# Patient Record
Sex: Female | Born: 1955 | Race: White | Hispanic: No | Marital: Married | Smoking: Former smoker
Health system: Southern US, Community
[De-identification: ages and names within clinical notes are randomized; demographics above are authoritative.]

## PROBLEM LIST (undated history)

## (undated) DIAGNOSIS — M8580 Other specified disorders of bone density and structure, unspecified site: Secondary | ICD-10-CM

## (undated) DIAGNOSIS — S82143A Displaced bicondylar fracture of unspecified tibia, initial encounter for closed fracture: Secondary | ICD-10-CM

## (undated) DIAGNOSIS — D509 Iron deficiency anemia, unspecified: Secondary | ICD-10-CM

## (undated) DIAGNOSIS — N390 Urinary tract infection, site not specified: Secondary | ICD-10-CM

## (undated) DIAGNOSIS — E538 Deficiency of other specified B group vitamins: Secondary | ICD-10-CM

## (undated) DIAGNOSIS — K219 Gastro-esophageal reflux disease without esophagitis: Secondary | ICD-10-CM

## (undated) DIAGNOSIS — M1990 Unspecified osteoarthritis, unspecified site: Secondary | ICD-10-CM

## (undated) DIAGNOSIS — E611 Iron deficiency: Secondary | ICD-10-CM

## (undated) DIAGNOSIS — R911 Solitary pulmonary nodule: Secondary | ICD-10-CM

## (undated) DIAGNOSIS — T7840XA Allergy, unspecified, initial encounter: Secondary | ICD-10-CM

## (undated) DIAGNOSIS — J189 Pneumonia, unspecified organism: Secondary | ICD-10-CM

## (undated) DIAGNOSIS — R12 Heartburn: Secondary | ICD-10-CM

## (undated) HISTORY — DX: Allergy, unspecified, initial encounter: T78.40XA

## (undated) HISTORY — DX: Solitary pulmonary nodule: R91.1

## (undated) HISTORY — DX: Other specified disorders of bone density and structure, unspecified site: M85.80

## (undated) HISTORY — DX: Gastro-esophageal reflux disease without esophagitis: K21.9

## (undated) HISTORY — DX: Iron deficiency anemia, unspecified: D50.9

## (undated) HISTORY — DX: Deficiency of other specified B group vitamins: E53.8

## (undated) HISTORY — PX: POLYPECTOMY: SHX149

## (undated) HISTORY — DX: Heartburn: R12

## (undated) HISTORY — DX: Iron deficiency: E61.1

## (undated) HISTORY — PX: COLONOSCOPY: SHX174

## (undated) HISTORY — DX: Unspecified osteoarthritis, unspecified site: M19.90

## (undated) HISTORY — PX: COSMETIC SURGERY: SHX468

## (undated) HISTORY — DX: Displaced bicondylar fracture of unspecified tibia, initial encounter for closed fracture: S82.143A

## (undated) HISTORY — PX: CATARACT EXTRACTION: SUR2

## (undated) HISTORY — DX: Pneumonia, unspecified organism: J18.9

## (undated) HISTORY — PX: OTHER SURGICAL HISTORY: SHX169

---

## 1998-07-10 ENCOUNTER — Other Ambulatory Visit: Admission: RE | Admit: 1998-07-10 | Discharge: 1998-07-10 | Payer: Self-pay | Admitting: Obstetrics & Gynecology

## 1999-12-08 HISTORY — PX: GASTRIC BYPASS: SHX52

## 1999-12-22 ENCOUNTER — Encounter: Admission: RE | Admit: 1999-12-22 | Discharge: 2000-02-06 | Payer: Self-pay | Admitting: Internal Medicine

## 2000-03-25 ENCOUNTER — Other Ambulatory Visit: Admission: RE | Admit: 2000-03-25 | Discharge: 2000-03-25 | Payer: Self-pay | Admitting: Obstetrics & Gynecology

## 2001-04-25 ENCOUNTER — Other Ambulatory Visit: Admission: RE | Admit: 2001-04-25 | Discharge: 2001-04-25 | Payer: Self-pay | Admitting: Family Medicine

## 2002-03-10 ENCOUNTER — Encounter: Payer: Self-pay | Admitting: Urology

## 2002-03-10 ENCOUNTER — Encounter: Admission: RE | Admit: 2002-03-10 | Discharge: 2002-03-10 | Payer: Self-pay | Admitting: Urology

## 2002-04-17 ENCOUNTER — Encounter: Payer: Self-pay | Admitting: Urology

## 2002-04-17 ENCOUNTER — Ambulatory Visit (HOSPITAL_BASED_OUTPATIENT_CLINIC_OR_DEPARTMENT_OTHER): Admission: RE | Admit: 2002-04-17 | Discharge: 2002-04-17 | Payer: Self-pay | Admitting: Urology

## 2002-05-03 ENCOUNTER — Encounter: Admission: RE | Admit: 2002-05-03 | Discharge: 2002-05-03 | Payer: Self-pay | Admitting: Urology

## 2002-05-03 ENCOUNTER — Encounter: Payer: Self-pay | Admitting: Urology

## 2002-06-12 ENCOUNTER — Other Ambulatory Visit: Admission: RE | Admit: 2002-06-12 | Discharge: 2002-06-12 | Payer: Self-pay | Admitting: Obstetrics & Gynecology

## 2002-08-04 ENCOUNTER — Encounter: Payer: Self-pay | Admitting: Urology

## 2002-08-04 ENCOUNTER — Encounter: Admission: RE | Admit: 2002-08-04 | Discharge: 2002-08-04 | Payer: Self-pay | Admitting: Urology

## 2003-09-06 ENCOUNTER — Other Ambulatory Visit: Admission: RE | Admit: 2003-09-06 | Discharge: 2003-09-06 | Payer: Self-pay | Admitting: Obstetrics & Gynecology

## 2004-11-12 ENCOUNTER — Ambulatory Visit (HOSPITAL_BASED_OUTPATIENT_CLINIC_OR_DEPARTMENT_OTHER): Admission: RE | Admit: 2004-11-12 | Discharge: 2004-11-12 | Payer: Self-pay | Admitting: Plastic Surgery

## 2004-11-12 ENCOUNTER — Encounter (INDEPENDENT_AMBULATORY_CARE_PROVIDER_SITE_OTHER): Payer: Self-pay | Admitting: Specialist

## 2004-11-12 ENCOUNTER — Ambulatory Visit (HOSPITAL_COMMUNITY): Admission: RE | Admit: 2004-11-12 | Discharge: 2004-11-12 | Payer: Self-pay | Admitting: Plastic Surgery

## 2005-01-08 ENCOUNTER — Other Ambulatory Visit: Admission: RE | Admit: 2005-01-08 | Discharge: 2005-01-08 | Payer: Self-pay | Admitting: Obstetrics & Gynecology

## 2005-01-15 ENCOUNTER — Ambulatory Visit: Payer: Self-pay | Admitting: Internal Medicine

## 2005-01-23 ENCOUNTER — Ambulatory Visit: Payer: Self-pay | Admitting: Internal Medicine

## 2005-03-24 ENCOUNTER — Ambulatory Visit: Payer: Self-pay | Admitting: Internal Medicine

## 2005-03-31 ENCOUNTER — Ambulatory Visit: Payer: Self-pay | Admitting: Internal Medicine

## 2005-04-30 ENCOUNTER — Ambulatory Visit: Payer: Self-pay | Admitting: Internal Medicine

## 2005-05-07 ENCOUNTER — Ambulatory Visit: Payer: Self-pay | Admitting: Internal Medicine

## 2005-06-01 ENCOUNTER — Ambulatory Visit: Payer: Self-pay | Admitting: Internal Medicine

## 2005-06-08 ENCOUNTER — Ambulatory Visit: Payer: Self-pay | Admitting: Internal Medicine

## 2005-06-11 ENCOUNTER — Ambulatory Visit: Payer: Self-pay | Admitting: Internal Medicine

## 2005-06-25 ENCOUNTER — Ambulatory Visit: Payer: Self-pay | Admitting: Internal Medicine

## 2005-06-29 ENCOUNTER — Ambulatory Visit: Payer: Self-pay | Admitting: Internal Medicine

## 2005-08-05 ENCOUNTER — Ambulatory Visit: Payer: Self-pay | Admitting: Internal Medicine

## 2005-09-08 ENCOUNTER — Ambulatory Visit: Payer: Self-pay

## 2005-09-08 ENCOUNTER — Encounter: Payer: Self-pay | Admitting: Internal Medicine

## 2005-09-09 ENCOUNTER — Ambulatory Visit: Payer: Self-pay | Admitting: Internal Medicine

## 2005-09-21 ENCOUNTER — Ambulatory Visit: Payer: Self-pay | Admitting: Internal Medicine

## 2005-09-23 ENCOUNTER — Encounter (HOSPITAL_COMMUNITY): Admission: RE | Admit: 2005-09-23 | Discharge: 2005-12-22 | Payer: Self-pay | Admitting: Internal Medicine

## 2005-10-22 ENCOUNTER — Ambulatory Visit: Payer: Self-pay | Admitting: Internal Medicine

## 2005-11-19 ENCOUNTER — Ambulatory Visit: Payer: Self-pay | Admitting: Internal Medicine

## 2005-12-21 ENCOUNTER — Ambulatory Visit: Payer: Self-pay | Admitting: Internal Medicine

## 2006-01-27 ENCOUNTER — Other Ambulatory Visit: Admission: RE | Admit: 2006-01-27 | Discharge: 2006-01-27 | Payer: Self-pay | Admitting: Obstetrics & Gynecology

## 2006-03-10 ENCOUNTER — Ambulatory Visit: Payer: Self-pay | Admitting: Internal Medicine

## 2006-04-20 ENCOUNTER — Ambulatory Visit: Payer: Self-pay | Admitting: Internal Medicine

## 2006-05-14 ENCOUNTER — Encounter (HOSPITAL_COMMUNITY): Admission: RE | Admit: 2006-05-14 | Discharge: 2006-08-12 | Payer: Self-pay | Admitting: Internal Medicine

## 2006-06-14 ENCOUNTER — Ambulatory Visit: Payer: Self-pay | Admitting: Internal Medicine

## 2006-06-15 ENCOUNTER — Ambulatory Visit: Payer: Self-pay | Admitting: Internal Medicine

## 2006-07-14 ENCOUNTER — Ambulatory Visit: Payer: Self-pay | Admitting: Internal Medicine

## 2006-08-12 ENCOUNTER — Ambulatory Visit: Payer: Self-pay | Admitting: Internal Medicine

## 2006-09-07 ENCOUNTER — Ambulatory Visit: Payer: Self-pay | Admitting: Internal Medicine

## 2006-10-08 ENCOUNTER — Ambulatory Visit: Payer: Self-pay | Admitting: Internal Medicine

## 2006-10-18 ENCOUNTER — Encounter (HOSPITAL_COMMUNITY): Admission: RE | Admit: 2006-10-18 | Discharge: 2006-12-02 | Payer: Self-pay | Admitting: Internal Medicine

## 2006-11-09 ENCOUNTER — Ambulatory Visit: Payer: Self-pay | Admitting: Internal Medicine

## 2006-11-17 ENCOUNTER — Ambulatory Visit: Payer: Self-pay | Admitting: Internal Medicine

## 2006-12-08 ENCOUNTER — Ambulatory Visit: Payer: Self-pay | Admitting: Internal Medicine

## 2006-12-08 LAB — CONVERTED CEMR LAB
BUN: 9 mg/dL (ref 6–23)
Folate: 6.1 ng/mL
Potassium: 3.7 meq/L (ref 3.5–5.1)
Vitamin B-12: >1500 pg/mL — ABNORMAL HIGH (ref 211–911)

## 2007-01-07 ENCOUNTER — Ambulatory Visit: Payer: Self-pay | Admitting: Internal Medicine

## 2007-02-07 ENCOUNTER — Ambulatory Visit: Payer: Self-pay | Admitting: Internal Medicine

## 2007-02-11 ENCOUNTER — Ambulatory Visit (HOSPITAL_COMMUNITY): Admission: RE | Admit: 2007-02-11 | Discharge: 2007-02-11 | Payer: Self-pay | Admitting: Family Medicine

## 2007-03-03 ENCOUNTER — Ambulatory Visit: Payer: Self-pay | Admitting: Internal Medicine

## 2007-03-03 LAB — CONVERTED CEMR LAB: Vit D, 1,25-Dihydroxy: 19 — ABNORMAL LOW (ref 20–57)

## 2007-04-14 ENCOUNTER — Ambulatory Visit: Payer: Self-pay | Admitting: Internal Medicine

## 2007-05-13 ENCOUNTER — Ambulatory Visit: Payer: Self-pay | Admitting: Internal Medicine

## 2007-05-24 ENCOUNTER — Ambulatory Visit: Payer: Self-pay | Admitting: Internal Medicine

## 2007-05-24 LAB — CONVERTED CEMR LAB
Basophils Relative: 1.3 % — ABNORMAL HIGH (ref 0.0–1.0)
Eosinophils Absolute: 0.4 10*3/uL (ref 0.0–0.6)
Eosinophils Relative: 6 % — ABNORMAL HIGH (ref 0.0–5.0)
Folate: 3.8 ng/mL
HCT: 36.6 % (ref 36.0–46.0)
Hemoglobin: 12.8 g/dL (ref 12.0–15.0)
Lymphocytes Relative: 26.8 % (ref 12.0–46.0)
MCV: 93 fL (ref 78.0–100.0)
Neutro Abs: 3.7 10*3/uL (ref 1.4–7.7)
Neutrophils Relative %: 61 % (ref 43.0–77.0)
Platelets: 185 10*3/uL (ref 150–400)
WBC: 6.1 10*3/uL (ref 4.5–10.5)

## 2007-06-02 DIAGNOSIS — L408 Other psoriasis: Secondary | ICD-10-CM | POA: Insufficient documentation

## 2007-06-02 DIAGNOSIS — K219 Gastro-esophageal reflux disease without esophagitis: Secondary | ICD-10-CM | POA: Insufficient documentation

## 2007-06-02 DIAGNOSIS — M949 Disorder of cartilage, unspecified: Secondary | ICD-10-CM

## 2007-06-02 DIAGNOSIS — M899 Disorder of bone, unspecified: Secondary | ICD-10-CM | POA: Insufficient documentation

## 2007-06-02 DIAGNOSIS — M1990 Unspecified osteoarthritis, unspecified site: Secondary | ICD-10-CM | POA: Insufficient documentation

## 2007-06-09 ENCOUNTER — Ambulatory Visit: Payer: Self-pay | Admitting: Internal Medicine

## 2007-08-04 ENCOUNTER — Encounter: Payer: Self-pay | Admitting: Internal Medicine

## 2007-08-04 LAB — HM DIABETES EYE EXAM

## 2007-08-09 ENCOUNTER — Ambulatory Visit: Payer: Self-pay | Admitting: Internal Medicine

## 2007-08-09 LAB — CONVERTED CEMR LAB
Folate: 8.9 ng/mL
Iron: 54 ug/dL (ref 42–145)

## 2007-08-10 ENCOUNTER — Encounter: Payer: Self-pay | Admitting: Internal Medicine

## 2007-08-10 LAB — CONVERTED CEMR LAB: Vit D, 1,25-Dihydroxy: 17 — ABNORMAL LOW (ref 20–57)

## 2007-08-15 ENCOUNTER — Ambulatory Visit: Payer: Self-pay | Admitting: Internal Medicine

## 2007-08-15 DIAGNOSIS — D518 Other vitamin B12 deficiency anemias: Secondary | ICD-10-CM | POA: Insufficient documentation

## 2007-09-09 ENCOUNTER — Telehealth: Payer: Self-pay | Admitting: Internal Medicine

## 2007-09-12 ENCOUNTER — Ambulatory Visit: Payer: Self-pay | Admitting: Internal Medicine

## 2007-10-12 ENCOUNTER — Ambulatory Visit: Payer: Self-pay | Admitting: Internal Medicine

## 2007-11-10 ENCOUNTER — Ambulatory Visit: Payer: Self-pay | Admitting: Internal Medicine

## 2007-11-10 LAB — CONVERTED CEMR LAB
Basophils Absolute: 0 10*3/uL (ref 0.0–0.1)
Basophils Relative: 0 % (ref 0.0–1.0)
HCT: 37.3 % (ref 36.0–46.0)
Iron: 92 ug/dL (ref 42–145)
MCHC: 35 g/dL (ref 30.0–36.0)
Monocytes Relative: 5.5 % (ref 3.0–11.0)
RBC: 4.05 M/uL (ref 3.87–5.11)
RDW: 11 % — ABNORMAL LOW (ref 11.5–14.6)
Vitamin B-12: 418 pg/mL (ref 211–911)

## 2007-11-21 ENCOUNTER — Ambulatory Visit: Payer: Self-pay | Admitting: Internal Medicine

## 2007-11-21 DIAGNOSIS — D509 Iron deficiency anemia, unspecified: Secondary | ICD-10-CM | POA: Insufficient documentation

## 2007-11-29 ENCOUNTER — Telehealth: Payer: Self-pay | Admitting: Internal Medicine

## 2007-12-12 ENCOUNTER — Ambulatory Visit: Payer: Self-pay | Admitting: Internal Medicine

## 2008-01-11 ENCOUNTER — Ambulatory Visit: Payer: Self-pay | Admitting: Internal Medicine

## 2008-02-07 ENCOUNTER — Ambulatory Visit: Payer: Self-pay | Admitting: Internal Medicine

## 2008-02-07 LAB — CONVERTED CEMR LAB
Basophils Relative: 0.8 % (ref 0.0–1.0)
Eosinophils Relative: 4.6 % (ref 0.0–5.0)
HCT: 38.3 % (ref 36.0–46.0)
Platelets: 211 10*3/uL (ref 150–400)
RBC: 4.12 M/uL (ref 3.87–5.11)
RDW: 11.2 % — ABNORMAL LOW (ref 11.5–14.6)
WBC: 6.2 10*3/uL (ref 4.5–10.5)

## 2008-03-01 ENCOUNTER — Ambulatory Visit: Payer: Self-pay | Admitting: Internal Medicine

## 2008-03-01 DIAGNOSIS — J309 Allergic rhinitis, unspecified: Secondary | ICD-10-CM | POA: Insufficient documentation

## 2008-03-05 ENCOUNTER — Encounter (HOSPITAL_COMMUNITY): Admission: RE | Admit: 2008-03-05 | Discharge: 2008-06-03 | Payer: Self-pay | Admitting: Internal Medicine

## 2008-03-06 ENCOUNTER — Telehealth: Payer: Self-pay | Admitting: Internal Medicine

## 2008-06-19 ENCOUNTER — Ambulatory Visit: Payer: Self-pay | Admitting: Internal Medicine

## 2008-06-19 LAB — CONVERTED CEMR LAB
Basophils Relative: 0.9 % (ref 0.0–1.0)
Eosinophils Absolute: 0.3 10*3/uL (ref 0.0–0.7)
Eosinophils Relative: 5.3 % — ABNORMAL HIGH (ref 0.0–5.0)
HCT: 38 % (ref 36.0–46.0)
Hemoglobin: 13.2 g/dL (ref 12.0–15.0)
Iron: 141 ug/dL (ref 42–145)
MCV: 94.5 fL (ref 78.0–100.0)
Monocytes Absolute: 0.3 10*3/uL (ref 0.1–1.0)
Neutro Abs: 3.7 10*3/uL (ref 1.4–7.7)
Neutrophils Relative %: 62.4 % (ref 43.0–77.0)
RBC: 4.02 M/uL (ref 3.87–5.11)
WBC: 5.9 10*3/uL (ref 4.5–10.5)

## 2008-06-26 ENCOUNTER — Ambulatory Visit: Payer: Self-pay | Admitting: Internal Medicine

## 2008-07-06 LAB — CONVERTED CEMR LAB: Pap Smear: NORMAL

## 2008-08-23 ENCOUNTER — Telehealth: Payer: Self-pay | Admitting: Internal Medicine

## 2008-08-23 ENCOUNTER — Ambulatory Visit: Payer: Self-pay | Admitting: Internal Medicine

## 2008-08-23 DIAGNOSIS — G43709 Chronic migraine without aura, not intractable, without status migrainosus: Secondary | ICD-10-CM | POA: Insufficient documentation

## 2008-08-28 ENCOUNTER — Ambulatory Visit: Payer: Self-pay | Admitting: Cardiology

## 2008-09-19 ENCOUNTER — Ambulatory Visit: Payer: Self-pay | Admitting: Internal Medicine

## 2008-09-19 LAB — CONVERTED CEMR LAB
HCT: 37.4 % (ref 36.0–46.0)
Hemoglobin: 12.9 g/dL (ref 12.0–15.0)
Iron: 78 ug/dL (ref 42–145)
Monocytes Absolute: 0.4 10*3/uL (ref 0.1–1.0)
Monocytes Relative: 5.7 % (ref 3.0–12.0)
Neutro Abs: 4.3 10*3/uL (ref 1.4–7.7)
RDW: 11.7 % (ref 11.5–14.6)

## 2008-09-25 ENCOUNTER — Ambulatory Visit: Payer: Self-pay | Admitting: Internal Medicine

## 2008-10-05 LAB — CONVERTED CEMR LAB: Pap Smear: NORMAL

## 2008-12-25 ENCOUNTER — Ambulatory Visit: Payer: Self-pay | Admitting: Internal Medicine

## 2008-12-25 LAB — CONVERTED CEMR LAB
Basophils Absolute: 0 10*3/uL (ref 0.0–0.1)
Basophils Relative: 0.4 % (ref 0.0–3.0)
Eosinophils Absolute: 0.2 10*3/uL (ref 0.0–0.7)
Hemoglobin: 13.3 g/dL (ref 12.0–15.0)
MCHC: 34.9 g/dL (ref 30.0–36.0)
MCV: 94.1 fL (ref 78.0–100.0)
Monocytes Absolute: 0.4 10*3/uL (ref 0.1–1.0)
Neutro Abs: 4.4 10*3/uL (ref 1.4–7.7)
Neutrophils Relative %: 58.7 % (ref 43.0–77.0)
RBC: 4.06 M/uL (ref 3.87–5.11)

## 2009-01-02 ENCOUNTER — Ambulatory Visit: Payer: Self-pay | Admitting: Internal Medicine

## 2009-04-05 ENCOUNTER — Ambulatory Visit: Payer: Self-pay | Admitting: Internal Medicine

## 2009-04-05 DIAGNOSIS — F4322 Adjustment disorder with anxiety: Secondary | ICD-10-CM | POA: Insufficient documentation

## 2009-05-21 ENCOUNTER — Ambulatory Visit: Payer: Self-pay | Admitting: Internal Medicine

## 2009-06-18 ENCOUNTER — Ambulatory Visit: Payer: Self-pay | Admitting: Internal Medicine

## 2009-06-18 LAB — CONVERTED CEMR LAB
ALT: 32 units/L (ref 0–35)
AST: 29 units/L (ref 0–37)
Basophils Relative: 0 % (ref 0.0–3.0)
Bilirubin, Direct: 0.1 mg/dL (ref 0.0–0.3)
Chloride: 105 meq/L (ref 96–112)
Creatinine, Ser: 0.6 mg/dL (ref 0.4–1.2)
Eosinophils Relative: 7.3 % — ABNORMAL HIGH (ref 0.0–5.0)
GFR calc non Af Amer: 110.13 mL/min (ref >60)
HCT: 38.4 % (ref 36.0–46.0)
LDL Cholesterol: 75 mg/dL (ref 0–99)
MCV: 96 fL (ref 78.0–100.0)
Monocytes Absolute: 0.3 10*3/uL (ref 0.1–1.0)
Monocytes Relative: 5.7 % (ref 3.0–12.0)
Neutrophils Relative %: 60.6 % (ref 43.0–77.0)
Nitrite: NEGATIVE
Potassium: 4.8 meq/L (ref 3.5–5.1)
RBC: 4 M/uL (ref 3.87–5.11)
Total CHOL/HDL Ratio: 2
Total Protein: 6.2 g/dL (ref 6.0–8.3)
Urobilinogen, UA: 0.2
WBC: 6.1 10*3/uL (ref 4.5–10.5)

## 2009-06-25 ENCOUNTER — Ambulatory Visit: Payer: Self-pay | Admitting: Internal Medicine

## 2009-06-28 ENCOUNTER — Telehealth: Payer: Self-pay | Admitting: Speech Pathology

## 2009-10-04 LAB — CONVERTED CEMR LAB: Pap Smear: NORMAL

## 2009-12-16 ENCOUNTER — Ambulatory Visit: Payer: Self-pay | Admitting: Internal Medicine

## 2009-12-16 LAB — CONVERTED CEMR LAB
Basophils Absolute: 0 10*3/uL (ref 0.0–0.1)
Eosinophils Absolute: 0.2 10*3/uL (ref 0.0–0.7)
HCT: 37.2 % (ref 36.0–46.0)
Hemoglobin: 12.4 g/dL (ref 12.0–15.0)
Iron: 88 ug/dL (ref 42–145)
Lymphs Abs: 1.7 10*3/uL (ref 0.7–4.0)
MCHC: 33.4 g/dL (ref 30.0–36.0)
MCV: 97.6 fL (ref 78.0–100.0)
Monocytes Absolute: 0.2 10*3/uL (ref 0.1–1.0)
Monocytes Relative: 5.2 % (ref 3.0–12.0)
Neutro Abs: 2.7 10*3/uL (ref 1.4–7.7)
Platelets: 172 10*3/uL (ref 150.0–400.0)
RDW: 11.3 % — ABNORMAL LOW (ref 11.5–14.6)

## 2009-12-25 ENCOUNTER — Ambulatory Visit: Payer: Self-pay | Admitting: Internal Medicine

## 2009-12-25 DIAGNOSIS — G5600 Carpal tunnel syndrome, unspecified upper limb: Secondary | ICD-10-CM | POA: Insufficient documentation

## 2009-12-25 DIAGNOSIS — N318 Other neuromuscular dysfunction of bladder: Secondary | ICD-10-CM | POA: Insufficient documentation

## 2010-02-26 ENCOUNTER — Telehealth: Payer: Self-pay | Admitting: Internal Medicine

## 2010-08-25 ENCOUNTER — Ambulatory Visit: Payer: Self-pay | Admitting: Internal Medicine

## 2010-08-25 LAB — CONVERTED CEMR LAB
ALT: 25 U/L (ref 0–35)
AST: 21 U/L (ref 0–37)
Albumin: 3.4 g/dL — ABNORMAL LOW (ref 3.5–5.2)
Alkaline Phosphatase: 58 U/L (ref 39–117)
BUN: 11 mg/dL (ref 6–23)
Basophils Absolute: 0.1 K/uL (ref 0.0–0.1)
Basophils Relative: 0.9 % (ref 0.0–3.0)
Bilirubin Urine: NEGATIVE
Bilirubin, Direct: 0.1 mg/dL (ref 0.0–0.3)
CO2: 29 meq/L (ref 19–32)
Calcium: 8.8 mg/dL (ref 8.4–10.5)
Chloride: 105 meq/L (ref 96–112)
Cholesterol: 165 mg/dL (ref 0–200)
Creatinine, Ser: 0.6 mg/dL (ref 0.4–1.2)
Eosinophils Absolute: 0.4 K/uL (ref 0.0–0.7)
Eosinophils Relative: 6.4 % — ABNORMAL HIGH (ref 0.0–5.0)
GFR calc non Af Amer: 116.34 mL/min (ref >60)
Glucose, Bld: 76 mg/dL (ref 70–99)
HCT: 37.8 % (ref 36.0–46.0)
HDL: 66.6 mg/dL (ref >39.00)
Hemoglobin: 13.1 g/dL (ref 12.0–15.0)
Ketones, ur: NEGATIVE mg/dL
LDL Cholesterol: 83 mg/dL (ref 0–99)
Lymphocytes Relative: 23.6 % (ref 12.0–46.0)
Lymphs Abs: 1.4 K/uL (ref 0.7–4.0)
MCHC: 34.5 g/dL (ref 30.0–36.0)
MCV: 95.3 fL (ref 78.0–100.0)
Monocytes Absolute: 0.3 K/uL (ref 0.1–1.0)
Monocytes Relative: 5.2 % (ref 3.0–12.0)
Neutro Abs: 3.8 K/uL (ref 1.4–7.7)
Neutrophils Relative %: 63.9 % (ref 43.0–77.0)
Nitrite: NEGATIVE
Platelets: 176 K/uL (ref 150.0–400.0)
Potassium: 4.5 meq/L (ref 3.5–5.1)
RBC: 3.97 M/uL (ref 3.87–5.11)
RDW: 12 % (ref 11.5–14.6)
Sodium: 140 meq/L (ref 135–145)
Specific Gravity, Urine: 1.01 (ref 1.000–1.030)
TSH: 2.85 u[IU]/mL (ref 0.35–5.50)
Total Bilirubin: 0.5 mg/dL (ref 0.3–1.2)
Total CHOL/HDL Ratio: 2
Total Protein, Urine: NEGATIVE mg/dL
Total Protein: 5.9 g/dL — ABNORMAL LOW (ref 6.0–8.3)
Triglycerides: 78 mg/dL (ref 0.0–149.0)
Urine Glucose: NEGATIVE mg/dL
Urobilinogen, UA: 0.2 (ref 0.0–1.0)
VLDL: 15.6 mg/dL (ref 0.0–40.0)
WBC: 6 10*3/microliter (ref 4.5–10.5)
pH: 6 (ref 5.0–8.0)

## 2010-09-01 ENCOUNTER — Encounter: Payer: Self-pay | Admitting: Internal Medicine

## 2010-09-01 ENCOUNTER — Ambulatory Visit: Payer: Self-pay | Admitting: Internal Medicine

## 2010-09-01 DIAGNOSIS — Z9884 Bariatric surgery status: Secondary | ICD-10-CM | POA: Insufficient documentation

## 2010-10-22 ENCOUNTER — Ambulatory Visit: Payer: Self-pay | Admitting: Internal Medicine

## 2010-10-22 DIAGNOSIS — L570 Actinic keratosis: Secondary | ICD-10-CM | POA: Insufficient documentation

## 2010-10-22 LAB — CONVERTED CEMR LAB
Basophils Relative: 0.9 % (ref 0.0–3.0)
Eosinophils Relative: 4.1 % (ref 0.0–5.0)
Folate: 5.1 ng/mL
Lymphocytes Relative: 35.1 % (ref 12.0–46.0)
Monocytes Absolute: 0.3 10*3/uL (ref 0.1–1.0)
Monocytes Relative: 4.9 % (ref 3.0–12.0)
Neutrophils Relative %: 55 % (ref 43.0–77.0)
Platelets: 202 10*3/uL (ref 150.0–400.0)
RBC: 4.08 M/uL (ref 3.87–5.11)
Vitamin B-12: 420 pg/mL (ref 211–911)
WBC: 5.2 10*3/uL (ref 4.5–10.5)

## 2011-01-06 NOTE — Assessment & Plan Note (Signed)
Summary: mole bx/njr also cbc w/diff 995.20/njr   Vital Signs:  Patient profile:   55 year old female Height:      66 inches Temp:     98.2 degrees F oral Pulse rate:   72 / minute Resp:     14 per minute BP sitting:   136 / 80  (left arm)  Vitals Entered By: Willy Eddy, LPN (October 22, 2010 12:07 PM) CC: cyst removal on back Is Patient Diabetic? No   Primary Care Provider:  Stacie Glaze MD  CC:  cyst removal on back.  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9) 10)  Anemia, B12 Deficiency  (ICD-281.1) 11)  Osteopenia  (ICD-733.90) 12)  Gerd  (ICD-530.81) 13)  Psoriasis  (ICD-696.1) 14)  Osteoarthritis  (ICD-715.90)  Current Problems (verified): 1)  Bariatric Surgery Status  (ICD-V45.86) 2)  Detrusor, 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 Once Daily 2)  Vitamin D 1000 Unit  Caps (Cholecalciferol) .... 2 Per Day 3)  Calcium 500 500 Mg  Tabs (Calcium Carbonate) .Marland Kitchen.. 1000 Q Day 4)  Ocella 3-0.03 Mg  Tabs (Drospirenone-Ethinyl Estradiol) .... Take 1 By Mouth Qd 5)  Prilosec 20 Mg  Cpdr (Omeprazole) .... One By Mouth Bid 6)  Cobal-1000 1000 Mcg/ml Inj Soln (Cyanocobalamin) .Marland Kitchen.. 1 Ml Every Month Im 7)  Temazepam 15 Mg  Caps (Temazepam) .... At Bedtime As Needed 8)  Acyclovir 800 Mg Tabs (Acyclovir) .... One By Mouth Three Times A Day As Needed 9)  Bd Integra Syringe 23g X 1" 3 Ml  Misc (Syringe/needle (Disp)) .... To Use Monthy With B12 Injections 10)  Trimethoprim 100 Mg Tabs (Trimethoprim) .Marland Kitchen.. 1 Once Daily 11)  Alprazolam 0.5 Mg Tabs (Alprazolam) .... One By Mouth Q 6 Hours 12)  Relpax 40 Mg Tabs (Eletriptan Hydrobromide) .Marland Kitchen.. 1 As Needed For Migraine  Current Medications (verified): 1)  Multivitamins   Caps (Multiple Vitamin) .... Once Daily 2)  Vitamin D 1000 Unit  Caps (Cholecalciferol) .... 2 Per Day 3)  Calcium 500 500 Mg  Tabs (Calcium Carbonate) .Marland Kitchen.. 1000 Q Day 4)  Ocella 3-0.03 Mg  Tabs (Drospirenone-Ethinyl Estradiol) .... Take 1 By Mouth Qd 5)  Prilosec 20 Mg  Cpdr (Omeprazole) .... One By Mouth Bid 6)  Cobal-1000 1000 Mcg/ml Inj Soln (Cyanocobalamin) .Marland KitchenMarland KitchenMarland Kitchen 1  Ml Every Month Im 7)  Temazepam 15 Mg  Caps (Temazepam) .... At Bedtime As Needed 8)  Acyclovir 800 Mg Tabs (Acyclovir) .... One By Mouth Three Times A Day As Needed 9)  Bd Integra Syringe 23g X 1" 3 Ml  Misc (Syringe/needle (Disp)) .... To Use Monthy With B12 Injections 10)  Trimethoprim 100 Mg Tabs (Trimethoprim) .Marland Kitchen.. 1 Once Daily 11)  Alprazolam 0.5 Mg Tabs (Alprazolam) .... One By Mouth Q 6 Hours 12)  Relpax 40 Mg Tabs (Eletriptan Hydrobromide) .Marland Kitchen.. 1 As Needed For Migraine 13)  Phentermine Hcl 37.5 Mg Caps (Phentermine Hcl) .... One By Mouth Daily  Allergies (verified): No Known Drug  Allergies  Past History:  Family History: Last updated: 06/02/2007 Fam hx CAD  Social History: Last updated: 06/02/2007 Married Former Smoker  Risk Factors: Smoking Status: quit (10/22/2010)  Past medical, surgical, family and social histories (including risk factors) reviewed, and no changes noted (except as noted below).  Past Medical History: Reviewed history from 03/01/2008 and no changes required. Osteoarthritis GERD Osteopenia iron def Anemia-iron deficiency b 12 def Allergic rhinitis  Past Surgical History: Reviewed history from 06/02/2007 and no changes required. Colonoscopy 06/25/2005 Gastric bypass sx-2001  Family History: Reviewed history from 06/02/2007 and no changes required. Fam hx CAD  Social History: Reviewed history from 06/02/2007 and no changes required. Married Former Smoker  Review of Systems       The patient complains of weight gain.  The patient denies anorexia, fever, weight loss, vision loss, decreased hearing, hoarseness, chest pain, syncope, dyspnea on exertion, peripheral edema, prolonged cough, headaches, hemoptysis, abdominal pain, melena, hematochezia, severe indigestion/heartburn, hematuria, incontinence, genital sores, muscle weakness, suspicious skin lesions, transient blindness, difficulty walking, depression, unusual weight change, abnormal bleeding, enlarged lymph nodes, angioedema, and breast masses.    Physical Exam  General:  Well-developed,well-nourished,in no acute distress; alert,appropriate and cooperative throughout examination Head:  Normocephalic and atraumatic without obvious abnormalities. No apparent alopecia or balding. Eyes:  pupils equal and pupils round.   Ears:  R ear normal and L ear normal.   Neck:  No deformities, masses, or tenderness noted. Lungs:  Normal respiratory effort, chest expands symmetrically. Lungs are clear to auscultation, no crackles or wheezes. Heart:  normal rate and regular rhythm.    Abdomen:  Bowel sounds positive,abdomen soft and non-tender without masses, organomegaly or hernias noted.   Impression & Recommendations:  Problem # 1:  BENIGN NEOPLASM OF SKIN OF TRUNK EXCEPT SCROTUM (ICD-216.5)  pt was preped in a sterile manor and informed consent obtained. Using a 15 blade the1.2 cm  lesion was removed closed with 5 sutures and sent for pathology. sterile dressings were applied  and wound care discussed with the pt. sutured with 5 x 5-0 sutures  Orders: No Charge Patient Arrived (NCPA0) (NCPA0) Surgical Suture Tray (Z6109) Excise lesion (TAL) 0.6 - 1.0 (11401)  Problem # 2:  BARIATRIC SURGERY STATUS (ICD-V45.86) weight gain  Complete Medication List: 1)  Multivitamins Caps (Multiple vitamin) .... Once daily 2)  Vitamin D 1000 Unit Caps (Cholecalciferol) .... 2 per day 3)  Calcium 500 500 Mg Tabs (Calcium carbonate) .Marland Kitchen.. 1000 q day 4)  Ocella 3-0.03 Mg Tabs (Drospirenone-ethinyl estradiol) .... Take 1 by mouth qd 5)  Prilosec 20 Mg Cpdr (Omeprazole) .... One by mouth bid 6)  Cobal-1000 1000 Mcg/ml Inj Soln (Cyanocobalamin) .Marland Kitchen.. 1 ml every month im 7)  Temazepam 15 Mg Caps (Temazepam) .... At bedtime as needed 8)  Acyclovir 800 Mg Tabs (Acyclovir) .Marland KitchenMarland KitchenMarland Kitchen  One by mouth three times a day as needed 9)  Bd Integra Syringe 23g X 1" 3 Ml Misc (Syringe/needle (disp)) .... To use monthy with b12 injections 10)  Trimethoprim 100 Mg Tabs (Trimethoprim) .Marland Kitchen.. 1 once daily 11)  Alprazolam 0.5 Mg Tabs (Alprazolam) .... One by mouth q 6 hours 12)  Relpax 40 Mg Tabs (Eletriptan hydrobromide) .Marland Kitchen.. 1 as needed for migraine 13)  Phentermine Hcl 37.5 Mg Caps (Phentermine hcl) .... One by mouth daily  Other Orders: Venipuncture (29937) TLB-B12 + Folate Pnl ([id number]-O17/PZW) TLB-CBC Platelet - w/Differential (85025-CBCD)  Patient Instructions: 1)  Please schedule a follow-up appointment in 4 months. Prescriptions: PHENTERMINE HCL 37.5 MG CAPS (PHENTERMINE HCL) one by mouth  daily  #30 x 3   Entered and Authorized by:   Stacie Glaze MD   Signed by:   Stacie Glaze MD on 10/22/2010   Method used:   Print then Give to Patient   RxID:   [id number]    Orders Added: 1)  Venipuncture [44315] 2)  TLB-B12 + Folate Pnl [[id number]-B12/FOL] 3)  TLB-CBC Platelet - w/Differential [85025-CBCD] 4)  No Charge Patient Arrived (NCPA0) [NCPA0] 5)  Surgical Suture Tray [A4550] 6)  Excise lesion (TAL) 0.6 - 1.0 [11401]

## 2011-01-06 NOTE — Assessment & Plan Note (Signed)
Summary: 6 month rov/njr----PT [facility] - [facility] // RS   Vital Signs:  Patient profile:   55 year old female Height:      66 inches Weight:      208 pounds BMI:     33.69 Temp:     98.6 degrees F oral Pulse rate:   76 / minute Resp:     14 per minute BP sitting:   126 / 80  (left arm)  Vitals Entered By: Willy Eddy, LPN (December 25, 2009 8:30 AM) CC: roa labs   CC:  roa labs.  History of Present Illness: follow up meds and baraitric surgery status slight weight gain over the holidays increased GERD has increasd pain in left hand with hx of carple tunel the pt is wondering if the B12 has a role in this refill on meds today  Preventive Screening-Counseling & Management  Alcohol-Tobacco     Smoking Status: quit  Problems Prior to Update: 1)  Preventive Health Care  (ICD-V70.0) 2)  Adjustment Disorder With Anxiety  (ICD-309.24) 3)  Chronic Migraine w/o Aura w/o Intractable w/o Sm  (ICD-346.70) 4)  Allergic Rhinitis  (ICD-477.9) 5)  Anemia-iron Deficiency  (ICD-280.9) 6)  Anemia, B12 Deficiency  (ICD-281.1) 7)  Osteopenia  (ICD-733.90) 8)  Gerd  (ICD-530.81) 9)  Psoriasis  (ICD-696.1) 10)  Osteoarthritis  (ICD-715.90)  Medications Prior to Update: 1)  Multivitamins   Caps (Multiple Vitamin) .... Once Daily 2)  Vitamin D 1000 Unit  Caps (Cholecalciferol) .... 2 Per Day 3)  Calcium 500 500 Mg  Tabs (Calcium Carbonate) .Marland Kitchen.. 1000 Q Day 4)  Ocella 3-0.03 Mg  Tabs (Drospirenone-Ethinyl Estradiol) .... Take 1 By Mouth Qd 5)  Prilosec 20 Mg  Cpdr (Omeprazole) .... One By Mouth Bid 6)  Cobal-1000 1000 Mcg/ml Inj Soln (Cyanocobalamin) .Marland Kitchen.. 1 Ml Every Month Im 7)  Temazepam 15 Mg  Caps (Temazepam) .... At Bedtime As Needed 8)  Valtrex 500 Mg  Tabs (Valacyclovir Hcl) .... One By Mouth Three Times A Day Pern Cold Sore 9)  Bd Integra Syringe 23g X 1" 3 Ml  Misc (Syringe/needle (Disp)) .... To Use Monthy With B12 Injections 10)  Relpax 40 Mg Tabs (Eletriptan Hydrobromide) .... One  By Mouth As Need For Migraine 11)  Gelnique 10 % Gel (Oxybutynin Chloride) .... Use As Directed 12)  Trimethoprim 100 Mg Tabs (Trimethoprim) .Marland Kitchen.. 1 Once Daily 13)  Citalopram Hydrobromide 20 Mg Tabs (Citalopram Hydrobromide) .... One By Mouth Daily 14)  Alprazolam 0.5 Mg Tabs (Alprazolam) .... One By Mouth Q 6 Hours  Current Medications (verified): 1)  Multivitamins   Caps (Multiple Vitamin) .... Once Daily 2)  Vitamin D 1000 Unit  Caps (Cholecalciferol) .... 2 Per Day 3)  Calcium 500 500 Mg  Tabs (Calcium Carbonate) .Marland Kitchen.. 1000 Q Day 4)  Ocella 3-0.03 Mg  Tabs (Drospirenone-Ethinyl Estradiol) .... Take 1 By Mouth Qd 5)  Prilosec 20 Mg  Cpdr (Omeprazole) .... One By Mouth Bid 6)  Cobal-1000 1000 Mcg/ml Inj Soln (Cyanocobalamin) .Marland Kitchen.. 1 Ml Every Month Im 7)  Temazepam 15 Mg  Caps (Temazepam) .... At Bedtime As Needed 8)  Acyclovir 800 Mg Tabs (Acyclovir) .... One By Mouth Three Times A Day As Needed 9)  Bd Integra Syringe 23g X 1" 3 Ml  Misc (Syringe/needle (Disp)) .... To Use Monthy With B12 Injections 10)  Relpax 40 Mg Tabs (Eletriptan Hydrobromide) .... One By Mouth As Need For Migraine 11)  Gelnique 10 % Gel (Oxybutynin Chloride) .... Use  As Directed 12)  Trimethoprim 100 Mg Tabs (Trimethoprim) .Marland Kitchen.. 1 Once Daily 13)  Citalopram Hydrobromide 40 Mg Tabs (Citalopram Hydrobromide) .... One By Mouth Daily 14)  Alprazolam 0.5 Mg Tabs (Alprazolam) .... One By Mouth Q 6 Hours  Allergies (verified): No Known Drug Allergies  Past History:  Family History: Last updated: 06/02/2007 Fam hx CAD  Social History: Last updated: 06/02/2007 Married Former Smoker  Risk Factors: Smoking Status: quit (12/25/2009)  Past medical, surgical, family and social histories (including risk factors) reviewed, and no changes noted (except as noted below).  Past Medical History: Reviewed history from 03/01/2008 and no changes required. Osteoarthritis GERD Osteopenia iron def Anemia-iron deficiency b 12  def Allergic rhinitis  Past Surgical History: Reviewed history from 06/02/2007 and no changes required. Colonoscopy 06/25/2005 Gastric bypass sx-2001  Family History: Reviewed history from 06/02/2007 and no changes required. Fam hx CAD  Social History: Reviewed history from 06/02/2007 and no changes required. Married Former Smoker  Review of Systems  The patient denies anorexia, fever, weight loss, weight gain, vision loss, decreased hearing, hoarseness, chest pain, syncope, dyspnea on exertion, peripheral edema, prolonged cough, headaches, hemoptysis, abdominal pain, melena, hematochezia, severe indigestion/heartburn, hematuria, incontinence, genital sores, muscle weakness, suspicious skin lesions, transient blindness, difficulty walking, depression, unusual weight change, abnormal bleeding, enlarged lymph nodes, angioedema, and breast masses.    Physical Exam  General:  Well-developed,well-nourished,in no acute distress; alert,appropriate and cooperative throughout examination Head:  Normocephalic and atraumatic without obvious abnormalities. No apparent alopecia or balding. Ears:  R ear normal and L ear normal.   Nose:  no external deformity and no nasal discharge.   Mouth:  Oral mucosa and oropharynx without lesions or exudates.  Teeth in good repair.posterior lymphoid hypertrophy.   Neck:  No deformities, masses, or tenderness noted. Lungs:  Normal respiratory effort, chest expands symmetrically. Lungs are clear to auscultation, no crackles or wheezes. Heart:  normal rate and regular rhythm.   Abdomen:  Bowel sounds positive,abdomen soft and non-tender without masses, organomegaly or hernias noted.   Impression & Recommendations:  Problem # 1:  ADJUSTMENT DISORDER WITH ANXIETY (ICD-309.24) discussion of poor diet habit discussion of the role of depression in poor diet increase the celexa to 40 mg and urder diet and mood councilling  Problem # 2:  ANEMIA-IRON DEFICIENCY  (ICD-280.9) the injection are keeping the levels stable Her updated medication list for this problem includes:    Cobal-1000 1000 Mcg/ml Inj Soln (Cyanocobalamin) .Marland Kitchen... 1 ml every month im  Hgb: 12.4 (12/16/2009)   Hct: 37.2 (12/16/2009)   Platelets: 172.0 (12/16/2009) RBC: 3.81 (12/16/2009)   RDW: 11.3 (12/16/2009)   WBC: 4.8 (12/16/2009) MCV: 97.6 (12/16/2009)   MCHC: 33.4 (12/16/2009) Iron: 88 (12/16/2009)   % Sat: 28.9 (09/19/2008) B12: 288 (06/19/2008)   Folate: 5.4 (06/19/2008)   TSH: 2.75 (06/18/2009)  Problem # 3:  GERD (ICD-530.81) Assessment: Unchanged stable Her updated medication list for this problem includes:    Prilosec 20 Mg Cpdr (Omeprazole) ..... One by mouth bid  Labs Reviewed: Hgb: 12.4 (12/16/2009)   Hct: 37.2 (12/16/2009)  Problem # 4:  OSTEOARTHRITIS (ICD-715.90) Assessment: Unchanged  stable  Discussed use of medications, application of heat or cold, and exercises.   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 Once daily 2)  Vitamin D 1000 Unit Caps (Cholecalciferol) .... 2 per day 3)  Calcium 500 500 Mg Tabs (Calcium carbonate) .Marland Kitchen.. 1000 q day 4)  Ocella 3-0.03 Mg Tabs (Drospirenone-ethinyl estradiol) .... Take 1 by mouth qd 5)  Prilosec 20 Mg Cpdr (Omeprazole) .... One by mouth bid 6)  Cobal-1000 1000 Mcg/ml Inj Soln (Cyanocobalamin) .Marland Kitchen.. 1 ml every month im 7)  Temazepam 15 Mg Caps (Temazepam) .... At bedtime as needed 8)  Acyclovir 800 Mg Tabs (Acyclovir) .... One by mouth three times a day as needed 9)  Bd Integra Syringe 23g X 1" 3 Ml Misc (Syringe/needle (disp)) .... To use monthy with b12 injections 10)  Relpax 40 Mg Tabs (Eletriptan hydrobromide) .... One by mouth as need for  migraine 11)  Gelnique 10 % Gel (Oxybutynin chloride) .... Use as directed 12)  Trimethoprim 100 Mg Tabs (Trimethoprim) .Marland Kitchen.. 1 once daily 13)  Citalopram Hydrobromide 40 Mg Tabs (Citalopram hydrobromide) .... One by mouth daily 14)  Alprazolam 0.5 Mg Tabs (Alprazolam) .... One by mouth q 6 hours 15)  Diazepam 5 Mg Tabs (Diazepam) .... One by mouth q 12 hours for bladder spasm  Patient Instructions: 1)  Please schedule a follow-up appointment in 6 months.  CPX Prescriptions: DIAZEPAM 5 MG TABS (DIAZEPAM) one by mouth q 12 hours for bladder spasm  #60 x 3   Entered and Authorized by:   Stacie Glaze MD   Signed by:   Stacie Glaze MD on 12/25/2009   Method used:   Print then Give to Patient   RxID:   [id number] ACYCLOVIR 800 MG TABS (ACYCLOVIR) one by mouth three times a day as needed  #30 x 4   Entered and Authorized by:   Stacie Glaze MD   Signed by:   Stacie Glaze MD on 12/25/2009   Method used:   Electronically to        [facility]* (retail)       [address]       [place], [place]  [address]       Ph: [phone] or [phone]       Fax: [phone]   RxID:   [id number] CITALOPRAM HYDROBROMIDE 40 MG TABS (CITALOPRAM HYDROBROMIDE) one by mouth daily  #30 x 5   Entered and Authorized by:   Stacie Glaze MD   Signed by:   Stacie Glaze MD on 12/25/2009   Method used:   Electronically to        [facility] (retail)       [address]       [place], [place]  [address]       Ph: [phone] or [phone]       Fax: [phone]   RxID:   [id number]

## 2011-01-06 NOTE — Progress Notes (Signed)
Summary: Patient req. meds for URI  Phone Note Call from Patient Call back at [phone]   Caller: Patient Reason for Call: Acute Illness, Insurance Question Summary of Call: Taking OTC meds for Respiratory infection x 1wk.   - nothing helping - Patient wants to know if Dr. Lovell Sheehan can call in something into [facility].  Ears are also painful. Initial call taken by: Everrett Coombe,  February 26, 2010 2:19 PM    New/Updated Medications: ZITHROMAX Z-PAK 250 MG TABS (AZITHROMYCIN) Use as directed Prescriptions: ZITHROMAX Z-PAK 250 MG TABS (AZITHROMYCIN) Use as directed  #1 x 0   Entered by:   Willy Eddy, LPN   Authorized by:   Stacie Glaze MD   Signed by:   Willy Eddy, LPN on 51/88/4166   Method used:   Electronically to        [facility] (retail)       [address]       [place], [place]  [address]       Ph: [phone] or [phone]       Fax: [phone]   RxID:   [id number]

## 2011-01-06 NOTE — Assessment & Plan Note (Signed)
Summary: cxpx/no pap/njr----PT RSC (BMP) // RS   Vital Signs:  Patient profile:   55 year old female Height:      66 inches Weight:      214 pounds BMI:     34.67 Temp:     98.2 degrees F oral Pulse rate:   72 / minute Resp:     14 per minute BP sitting:   136 / 84  (left arm)  Vitals Entered By: Willy Eddy, LPN (September 01, 2010 2:14 PM)  Nutrition Counseling: Patient's BMI is greater than 25 and therefore counseled on weight management options. CC: cpx Is Patient Diabetic? No   Primary Care Provider:  Stacie Glaze MD  CC:  cpx.  History of Present Illness: The pt was asked about all immunizations, health maint. 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 Once Daily 2)  Vitamin D 1000 Unit  Caps (Cholecalciferol) .... 2 Per Day 3)  Calcium 500 500 Mg  Tabs (Calcium Carbonate) .Marland Kitchen.. 1000 Q Day 4)  Ocella 3-0.03 Mg  Tabs (Drospirenone-Ethinyl Estradiol) .... Take 1 By Mouth Qd 5)  Prilosec 20 Mg   Cpdr (Omeprazole) .... One By Mouth Bid 6)  Cobal-1000 1000 Mcg/ml Inj Soln (Cyanocobalamin) .Marland Kitchen.. 1 Ml Every Month Im 7)  Temazepam 15 Mg  Caps (Temazepam) .... At Bedtime As Needed 8)  Acyclovir 800 Mg Tabs (Acyclovir) .... One By Mouth Three Times A Day As Needed 9)  Bd Integra Syringe 23g X 1" 3 Ml  Misc (Syringe/needle (Disp)) .... To Use Monthy With B12 Injections 10)  Trimethoprim 100 Mg Tabs (Trimethoprim) .Marland Kitchen.. 1 Once Daily 11)  Alprazolam 0.5 Mg Tabs (Alprazolam) .... One By Mouth Q 6 Hours  Allergies (verified): No Known Drug Allergies  Past History:  Family History: Last updated: 06/02/2007 Fam hx CAD  Social History: Last updated: 06/02/2007 Married Former Smoker  Risk Factors: Smoking Status: quit (09/01/2010)  Past medical, surgical, family and social histories (including risk factors) reviewed, and no changes noted (except as noted below).  Past Medical History: Reviewed history from 03/01/2008 and no changes required. Osteoarthritis GERD Osteopenia iron def Anemia-iron deficiency b 12 def Allergic rhinitis  Past Surgical History: Reviewed history from 06/02/2007 and no changes required. Colonoscopy 06/25/2005 Gastric bypass sx-2001  Family History: Reviewed history from 06/02/2007 and no changes required. Fam hx CAD  Social History: Reviewed history from 06/02/2007 and no changes required. Married Former Smoker  Review of Systems  The patient denies anorexia, fever, weight loss, weight gain, vision loss, decreased hearing, hoarseness, chest  pain, syncope, dyspnea on exertion, peripheral edema, prolonged cough, headaches, hemoptysis, abdominal pain, melena, hematochezia, severe indigestion/heartburn, hematuria, incontinence, genital sores, muscle weakness, suspicious skin lesions, transient blindness, difficulty walking, depression, unusual weight change, abnormal bleeding, enlarged lymph nodes, angioedema, and breast masses.         Flu Vaccine  Consent Questions     Do you have a history of severe allergic reactions to this vaccine? no    Any prior history of allergic reactions to egg and/or gelatin? no    Do you have a sensitivity to the preservative Thimersol? no    Do you have a past history of Guillan-Barre Syndrome? no    Do you currently have an acute febrile illness? no    Have you ever had a severe reaction to latex? no    Vaccine information given and explained to patient? yes    Are you currently pregnant? no    Lot Number:[id number]   Exp Date:06/06/2011   Site Given  Left Deltoid IM    Physical Exam  General:  Well-developed,well-nourished,in no acute distress; alert,appropriate and cooperative throughout examination Head:  Normocephalic and atraumatic without obvious abnormalities. No apparent alopecia or balding. Eyes:  pupils equal and pupils round.   Ears:  R ear normal and L ear normal.   Nose:  no external deformity and no nasal discharge.   Mouth:  Oral mucosa and oropharynx without lesions or exudates.  Teeth in good repair.posterior lymphoid hypertrophy.   Neck:  No deformities, masses, or tenderness noted. Lungs:  Normal respiratory effort, chest expands symmetrically. Lungs are clear to auscultation, no crackles or wheezes. Heart:  normal rate and regular rhythm.   Abdomen:  Bowel sounds positive,abdomen soft and non-tender without masses, organomegaly or hernias noted. Msk:  No deformity or scoliosis noted of thoracic or lumbar spine.   Pulses:  R and L carotid,radial,femoral,dorsalis pedis and posterior tibial pulses are full and equal bilaterally Extremities:  trace left pedal edema and trace right pedal edema.   Neurologic:  alert & oriented X3, DTRs symmetrical and normal, and finger-to-nose normal.     Impression & Recommendations:  Problem # 1:  BARIATRIC SURGERY STATUS (ICD-V45.86) low albumin with por diet habis reviewed diet and set goal of protine shakes daily as a meal replacement goal  is to alos loss the 30 lbs she has slowly gained  Problem # 2:  PREVENTIVE HEALTH CARE (ICD-V70.0) The pt was asked about all immunizations, health maint. services that are appropriate to their age and was given guidance on diet exercize  and weight management  Orders: EKG w/ Interpretation (93000)  Mammogram: normal (10/04/2009) Pap smear: normal (10/04/2009) Colonoscopy: normal (06/25/2005) Td Booster: Historical (06/07/2003)   Flu Vax: Fluvax 3+ (09/01/2010)   Chol: 165 (08/25/2010)   HDL: 66.60 (08/25/2010)   LDL: 83 (08/25/2010)   TG: 78.0 (08/25/2010) TSH: 2.85 (08/25/2010)   Next mammogram due:: 10/2010 (09/01/2010) Next Colonoscopy due:: 07/2015 (06/25/2009)  Discussed using sunscreen, use of alcohol, drug use, self breast exam, routine dental care, routine eye care, schedule for GYN exam, routine physical exam, seat belts, multiple vitamins, osteoporosis prevention, adequate calcium intake in diet, recommendations for immunizations, mammograms and Pap smears.  Discussed exercise and checking cholesterol.  Discussed gun safety, safe sex, and contraception.  Complete Medication List: 1)  Multivitamins Caps (Multiple vitamin) .... Once daily 2)  Vitamin D 1000 Unit Caps (Cholecalciferol) .... 2 per day 3)  Calcium 500 500 Mg Tabs (Calcium carbonate) .Marland Kitchen.. 1000 q day  4)  Ocella 3-0.03 Mg Tabs (Drospirenone-ethinyl estradiol) .... Take 1 by mouth qd 5)  Prilosec 20 Mg Cpdr (Omeprazole) .... One by mouth bid 6)  Cobal-1000 1000 Mcg/ml Inj Soln (Cyanocobalamin) .Marland Kitchen.. 1 ml every month im 7)  Temazepam 15 Mg Caps (Temazepam) .... At bedtime as needed 8)  Acyclovir 800 Mg Tabs (Acyclovir) .... One by mouth three times a day as needed 9)  Bd Integra Syringe 23g X 1" 3 Ml Misc (Syringe/needle (disp)) .... To use monthy with b12 injections 10)  Trimethoprim 100 Mg Tabs (Trimethoprim) .Marland Kitchen.. 1 once daily 11)  Alprazolam 0.5 Mg Tabs (Alprazolam) .... One by mouth q 6 hours  Other Orders: Admin  1st Vaccine (16109) Flu Vaccine 6yrs + (60454)  Patient Instructions: 1)  Please schedule a follow-up appointment in 2-3 months mole bx 2)  CBC w/ Diff prior to visit, ICD-9: 995.20   Preventive Care Screening  Mammogram:    Date:  10/04/2009    Next Due:  10/2010    Results:  normal   Pap Smear:    Date:  10/05/2008    Next Due:  10/2010    Results:  normal

## 2011-02-11 ENCOUNTER — Encounter: Payer: Self-pay | Admitting: Internal Medicine

## 2011-02-11 ENCOUNTER — Ambulatory Visit (INDEPENDENT_AMBULATORY_CARE_PROVIDER_SITE_OTHER): Payer: BC Managed Care – PPO | Admitting: Internal Medicine

## 2011-02-11 DIAGNOSIS — J019 Acute sinusitis, unspecified: Secondary | ICD-10-CM

## 2011-02-11 DIAGNOSIS — M1990 Unspecified osteoarthritis, unspecified site: Secondary | ICD-10-CM

## 2011-02-11 DIAGNOSIS — D518 Other vitamin B12 deficiency anemias: Secondary | ICD-10-CM

## 2011-02-11 DIAGNOSIS — K219 Gastro-esophageal reflux disease without esophagitis: Secondary | ICD-10-CM

## 2011-02-11 DIAGNOSIS — M949 Disorder of cartilage, unspecified: Secondary | ICD-10-CM

## 2011-02-11 DIAGNOSIS — M899 Disorder of bone, unspecified: Secondary | ICD-10-CM

## 2011-02-11 DIAGNOSIS — L408 Other psoriasis: Secondary | ICD-10-CM

## 2011-02-11 DIAGNOSIS — D509 Iron deficiency anemia, unspecified: Secondary | ICD-10-CM

## 2011-02-11 LAB — CBC WITH DIFFERENTIAL/PLATELET
Basophils Absolute: 0 10*3/uL (ref 0.0–0.1)
Eosinophils Relative: 4.2 % (ref 0.0–5.0)
HCT: 37 % (ref 36.0–46.0)
Hemoglobin: 12.8 g/dL (ref 12.0–15.0)
Lymphs Abs: 1.5 10*3/uL (ref 0.7–4.0)
MCV: 93.7 fl (ref 78.0–100.0)
Monocytes Absolute: 0.4 10*3/uL (ref 0.1–1.0)
Monocytes Relative: 6.3 % (ref 3.0–12.0)
Neutro Abs: 4.1 10*3/uL (ref 1.4–7.7)
RDW: 12.5 % (ref 11.5–14.6)

## 2011-02-11 LAB — VITAMIN B12: Vitamin B-12: 315 pg/mL (ref 211–911)

## 2011-02-11 MED ORDER — LEVOFLOXACIN 750 MG PO TABS: 750.0000 mg | ORAL_TABLET | Freq: Every day | ORAL | Status: AC

## 2011-02-11 MED ORDER — ACYCLOVIR 800 MG PO TABS: 800.0000 mg | ORAL_TABLET | Freq: Three times a day (TID) | ORAL | Status: AC

## 2011-02-11 NOTE — Assessment & Plan Note (Signed)
Increased stiffness and joint pain

## 2011-02-11 NOTE — Assessment & Plan Note (Signed)
Assess b 12 levels

## 2011-02-11 NOTE — Progress Notes (Signed)
  Subjective:    Patient ID: Alexis Wallace, female    DOB: 06/03/1956, 55 y.o.   MRN: [id number]  HPI   is a 55 year old white female who presents for following up of her chronic bariatric surgery status she is followed for B12 deficiency iron deficiency and anemia following bypass surgery over 10 years ago.  She is also followed for weight monitoring her weight is stable today but we have counseled her about her weight gain since she had the bypass surgery.  She is on chronic PPI for monitoring of her acid secretions due to the surgery status.   she has an acute complaint today of upper respiratory tract infection she states that she has nasal congestion she is blowing out of collar to the discharge from her nose and she  even has teeth pain as well as chronic left-sided pain.  She does have a headache but denies fever chills  Review of Systems  HENT: Positive for congestion, rhinorrhea, dental problem, postnasal drip and sinus pressure.   Eyes: Positive for pain and redness.       Past Medical History  Diagnosis Date  . Osteoarthritis   . GERD (gastroesophageal reflux disease)   . Osteopenia   . Iron deficiency   . Anemia, iron deficiency   . B12 deficiency   . Allergy    Past Surgical History  Procedure Date  . Gastric bypass 2001  . Cosmetic surgery     tummy tuck    reports that she quit smoking about 33 years ago. She does not have any smokeless tobacco history on file. She reports that she drinks alcohol. She reports that she does not use illicit drugs. family history includes Heart disease in her father. No Known Allergies     Objective:   Physical Exam  Constitutional: She is oriented to person, place, and time. She appears well-developed and well-nourished. No distress.  HENT:  Head: Normocephalic and atraumatic.  Right Ear: External ear normal.  Left Ear: External ear normal.        Left turbinates are erythematous with a mucopurulent discharge coming  from the sinuses she is tender over the left maxillary and frontal sinus oropharynx is posterior cobblestoning with thick mucus discharge down the back of the posterior pharynx  Eyes: Conjunctivae and EOM are normal. Pupils are equal, round, and reactive to light.  Neck: Normal range of motion. Neck supple. No JVD present. No tracheal deviation present. No thyromegaly present.  Cardiovascular: Normal rate, regular rhythm, normal heart sounds and intact distal pulses.   No murmur heard. Pulmonary/Chest: Effort normal and breath sounds normal. She has no wheezes. She exhibits no tenderness.  Abdominal: Soft. Bowel sounds are normal.  Musculoskeletal: Normal range of motion. She exhibits no edema and no tenderness.  Lymphadenopathy:    She has no cervical adenopathy.  Neurological: She is alert and oriented to person, place, and time. She has normal reflexes. A cranial nerve deficit is present.  Skin: Skin is warm and dry. She is not diaphoretic.  Psychiatric: She has a normal mood and affect. Her behavior is normal.          Assessment & Plan:   patient has acute left maxillary sinusitis and will be placed on Levaquin 750 for 7 day protocol she is encouraged to get Mucinex over-the-counter liquid to take 4 times a day for her symptoms.   other problems per the problem focused examination

## 2011-02-11 NOTE — Assessment & Plan Note (Signed)
monitor iron and cbc

## 2011-02-11 NOTE — Assessment & Plan Note (Signed)
Never stop the PPI due to the gastric surgery

## 2011-02-11 NOTE — Assessment & Plan Note (Signed)
On vitamin d and calcium 

## 2011-02-11 NOTE — Assessment & Plan Note (Signed)
Using lemon juice for joints with success

## 2011-02-11 NOTE — Patient Instructions (Signed)
mucinex fast max liguid for cold and cough

## 2011-02-25 ENCOUNTER — Other Ambulatory Visit: Payer: Self-pay | Admitting: *Deleted

## 2011-02-25 MED ORDER — ERGOCALCIFEROL 1.25 MG (50000 UT) PO CAPS: 50000.0000 [IU] | ORAL_CAPSULE | ORAL | Status: DC

## 2011-02-25 MED ORDER — CYANOCOBALAMIN 500 MCG SL SUBL: 500.0000 ug | SUBLINGUAL_TABLET | Freq: Every day | SUBLINGUAL | Status: DC

## 2011-02-25 MED ORDER — FERROUS SULFATE 325 (65 FE) MG PO TABS: 325.0000 mg | ORAL_TABLET | Freq: Every day | ORAL | Status: DC

## 2011-04-13 ENCOUNTER — Other Ambulatory Visit: Payer: Self-pay | Admitting: *Deleted

## 2011-04-13 MED ORDER — OMEPRAZOLE 20 MG PO CPDR: 20.0000 mg | DELAYED_RELEASE_CAPSULE | Freq: Two times a day (BID) | ORAL | Status: DC

## 2011-04-16 ENCOUNTER — Other Ambulatory Visit: Payer: Self-pay | Admitting: Internal Medicine

## 2011-04-16 MED ORDER — OMEPRAZOLE 20 MG PO CPDR: 20.0000 mg | DELAYED_RELEASE_CAPSULE | Freq: Two times a day (BID) | ORAL | Status: DC

## 2011-04-16 NOTE — Telephone Encounter (Signed)
Refill Prilosec to [facility].

## 2011-04-16 NOTE — Telephone Encounter (Signed)
DONE

## 2011-04-23 ENCOUNTER — Other Ambulatory Visit: Payer: Self-pay | Admitting: Internal Medicine

## 2011-04-24 ENCOUNTER — Other Ambulatory Visit: Payer: Self-pay | Admitting: *Deleted

## 2011-04-24 ENCOUNTER — Other Ambulatory Visit: Payer: Self-pay | Admitting: Internal Medicine

## 2011-04-24 NOTE — Op Note (Signed)
NAMESUMAIYA, ARRUDA            ACCOUNT NO.:  [id number]   MEDICAL RECORD NO.:  [id number]          PATIENT TYPE:  AMB   LOCATION:  [facility]                          FACILITY:  [facility]   PHYSICIAN:  Etter Sjogren, M.D.     DATE OF BIRTH:  1956/07/24   DATE OF PROCEDURE:  11/12/2004  DATE OF DISCHARGE:                                 OPERATIVE REPORT   PREOPERATIVE DIAGNOSES:  1.  Pigmented lesion of undetermined behavior of the abdomen, greater than 1      cm.  2.  Pigmented lesion of undetermined behavior of the left thigh, less than      0.5 cm.   POSTOPERATIVE DIAGNOSES:  1.  Pigmented lesion of undetermined behavior of the abdomen, greater than 1      cm.  2.  Pigmented lesion of undetermined behavior of the left thigh, less than      0.5 cm.  3.  Complex wounds, abdomen and thigh totaling greater than 2.5 cm.   OPERATION PERFORMED:  1.  Excision of lesion, abdomen undetermined behavior, greater than 1 cm.  2.  Excision of lesion, lateral thigh, less than 0.5 cm, undetermined      behavior.  3.  Complex wound closures, abdomen and thigh, total length greater than 2.5      cm.   SURGEON:  Etter Sjogren, M.D.   ANESTHESIA:  1% Xylocaine with epinephrine plus bicarb.   INDICATIONS FOR PROCEDURE:  The patient is a 55 year old woman with  pigmented lesions with irregular borders, enlarging, changing.  It is  medically indicated to remove these.  The nature of the procedure and the  risks were understood by her including the possibility of further surgery  depending on the final pathology report and she wished to proceed.   DESCRIPTION OF PROCEDURE:  The patient was placed supine.  She was prepped  with Betadine and draped with sterile drapes.  Satisfactory local anesthesia  achieved.  1% Xylocaine with epinephrine plus bicarb.  The excisions were  performed.  The specimens were removed and a thorough irrigation.  Excellent  hemostasis having been confirmed, a layered closure with  4-0 Monocryl  interrupted inverted deep sutures, 4-0 Monocryl interrupted inverted deep  dermal and subcutaneous sutures and 4-0 Monocryl running subcuticular  suture.  Steri-Strips and dry sterile dressing applied.  Tolerated it well.  Recheck in the office next week.      Markus.Osmond   DB/MEDQ  D:  11/12/2004  T:  11/12/2004  Job:  [id number]

## 2011-05-13 ENCOUNTER — Ambulatory Visit (INDEPENDENT_AMBULATORY_CARE_PROVIDER_SITE_OTHER): Payer: BC Managed Care – PPO | Admitting: Internal Medicine

## 2011-05-13 ENCOUNTER — Encounter: Payer: Self-pay | Admitting: Internal Medicine

## 2011-05-13 DIAGNOSIS — R635 Abnormal weight gain: Secondary | ICD-10-CM

## 2011-05-13 DIAGNOSIS — Z8249 Family history of ischemic heart disease and other diseases of the circulatory system: Secondary | ICD-10-CM

## 2011-05-13 DIAGNOSIS — Z9884 Bariatric surgery status: Secondary | ICD-10-CM

## 2011-05-13 DIAGNOSIS — E559 Vitamin D deficiency, unspecified: Secondary | ICD-10-CM

## 2011-05-13 DIAGNOSIS — M1990 Unspecified osteoarthritis, unspecified site: Secondary | ICD-10-CM

## 2011-05-13 DIAGNOSIS — N318 Other neuromuscular dysfunction of bladder: Secondary | ICD-10-CM

## 2011-05-13 MED ORDER — ERGOCALCIFEROL 1.25 MG (50000 UT) PO CAPS: 50000.0000 [IU] | ORAL_CAPSULE | ORAL | Status: DC

## 2011-05-13 MED ORDER — OMEPRAZOLE 20 MG PO CPDR: 20.0000 mg | DELAYED_RELEASE_CAPSULE | Freq: Two times a day (BID) | ORAL | Status: DC

## 2011-05-13 MED ORDER — TEMAZEPAM 15 MG PO CAPS: 15.0000 mg | ORAL_CAPSULE | Freq: Every evening | ORAL | Status: DC | PRN

## 2011-05-13 NOTE — Assessment & Plan Note (Signed)
Refer for echocardiogram here given the information about family history of hypertrophic cardiomyopathy consider genetic testings if her sister's genetic markers are positive for her children

## 2011-05-13 NOTE — Progress Notes (Signed)
Subjective:    Patient ID: Alexis Wallace, female    DOB: 1956-04-03, 55 y.o.   MRN: [id number]  HPI Patient presents for bariatric status. Arch of B12 anemia iron and folate.  Patient also presents for monitoring of recent weight gain pattern. She relays he desire to eat cookies she recently has experienced increased arthritic pain the pain is primarily in her hands hips and knees she has noticed enlargement of the MCP joints of the hand. a history of anemia gastroesophageal reflux and allergic rhinitis A recent family history changes her sister diagnosed with hypertrophic cardiomyopathy    Review of Systems  Constitutional: Negative for activity change, appetite change and fatigue.  HENT: Negative for ear pain, congestion, neck pain, postnasal drip and sinus pressure.   Eyes: Negative for redness and visual disturbance.  Respiratory: Negative for cough, shortness of breath and wheezing.   Gastrointestinal: Negative for abdominal pain and abdominal distention.  Genitourinary: Negative for dysuria, frequency and menstrual problem.  Musculoskeletal: Negative for myalgias, joint swelling and arthralgias.  Skin: Negative for rash and wound.  Neurological: Negative for dizziness, weakness and headaches.  Hematological: Negative for adenopathy. Does not bruise/bleed easily.  Psychiatric/Behavioral: Negative for sleep disturbance and decreased concentration.   Past Medical History  Diagnosis Date  . Osteoarthritis   . GERD (gastroesophageal reflux disease)   . Osteopenia   . Iron deficiency   . Anemia, iron deficiency   . B12 deficiency   . Allergy    Past Surgical History  Procedure Date  . Gastric bypass 2001  . Cosmetic surgery     tummy tuck    reports that she quit smoking about 33 years ago. She does not have any smokeless tobacco history on file. She reports that she drinks alcohol. She reports that she does not use illicit drugs. family history includes Heart disease  in her father. No Known Allergies     Objective:   Physical Exam  Constitutional: She is oriented to person, place, and time. She appears well-developed and well-nourished. No distress.  HENT:  Head: Normocephalic and atraumatic.  Right Ear: External ear normal.  Left Ear: External ear normal.  Nose: Nose normal.  Mouth/Throat: Oropharynx is clear and moist.  Eyes: Conjunctivae and EOM are normal. Pupils are equal, round, and reactive to light.  Neck: Normal range of motion. Neck supple. No JVD present. No tracheal deviation present. No thyromegaly present.  Cardiovascular: Normal rate, regular rhythm, normal heart sounds and intact distal pulses.   No murmur heard. Pulmonary/Chest: Effort normal and breath sounds normal. She has no wheezes. She exhibits no tenderness.  Abdominal: Soft. Bowel sounds are normal.  Musculoskeletal: Normal range of motion. She exhibits no edema and no tenderness.  Lymphadenopathy:    She has no cervical adenopathy.  Neurological: She is alert and oriented to person, place, and time. She has normal reflexes. No cranial nerve deficit.  Skin: Skin is warm and dry. She is not diaphoretic.  Psychiatric: She has a normal mood and affect. Her behavior is normal.          Assessment & Plan:  We will order an echocardiogram because of a family history of hypertrophic cardiomyopathy.  She has been noncompliant with her vitamin therapy we will refill her vitamin D and her B12 and planned to be monitor that when she comes back in 3 months.  We urged compliance with her diet we identified cookies as the primary source of weight gain she will eliminate this  from her diet we hope that she will show a 10 pound weight loss before her next office visit.

## 2011-05-19 ENCOUNTER — Other Ambulatory Visit: Payer: Self-pay | Admitting: Internal Medicine

## 2011-05-19 DIAGNOSIS — Z8249 Family history of ischemic heart disease and other diseases of the circulatory system: Secondary | ICD-10-CM

## 2011-05-25 ENCOUNTER — Ambulatory Visit (INDEPENDENT_AMBULATORY_CARE_PROVIDER_SITE_OTHER): Payer: BC Managed Care – PPO | Admitting: Family Medicine

## 2011-05-25 ENCOUNTER — Encounter: Payer: Self-pay | Admitting: Family Medicine

## 2011-05-25 VITALS — BP 130/90 | Temp 98.2°F

## 2011-05-25 DIAGNOSIS — R300 Dysuria: Secondary | ICD-10-CM

## 2011-05-25 LAB — POCT URINALYSIS DIPSTICK
Glucose, UA: NEGATIVE
Ketones, UA: NEGATIVE
Spec Grav, UA: 1

## 2011-05-25 MED ORDER — CIPROFLOXACIN HCL 500 MG PO TABS: 500.0000 mg | ORAL_TABLET | Freq: Two times a day (BID) | ORAL | Status: AC

## 2011-05-25 MED ORDER — CIPROFLOXACIN HCL 500 MG PO TABS: 500.0000 mg | ORAL_TABLET | Freq: Two times a day (BID) | ORAL | Status: DC

## 2011-05-25 NOTE — Progress Notes (Signed)
  Subjective:    Patient ID: Alexis Wallace, female    DOB: 04/17/1956, 55 y.o.   MRN: [id number]  HPI Patient seen 4 day history of urinary urgency. Urine frequency. No burning. Denies fever, chills, nausea, vomiting, or back pain. Similar symptoms previously with UTI. Remote history of kidney stone. No gross hematuria.   Review of Systems  Constitutional: Negative for fever, chills and appetite change.  Gastrointestinal: Negative for nausea, vomiting, abdominal pain, diarrhea and constipation.  Genitourinary: Positive for dysuria and frequency.  Musculoskeletal: Negative for back pain.  Neurological: Negative for dizziness.       Objective:   Physical Exam  Constitutional: She appears well-developed and well-nourished.  HENT:  Head: Normocephalic and atraumatic.  Neck: Neck supple. No thyromegaly present.  Cardiovascular: Normal rate, regular rhythm and normal heart sounds.   Pulmonary/Chest: Breath sounds normal.  Abdominal: Soft. Bowel sounds are normal. There is no tenderness.          Assessment & Plan:  Dysuria. Rule out UTI. Urine culture obtained. Cipro 500 mg twice daily for 5 days

## 2011-05-25 NOTE — Progress Notes (Signed)
Addended by: Melchor Amour on: 05/25/2011 09:25 AM   Modules accepted: Orders

## 2011-05-27 LAB — URINE CULTURE: Colony Count: >=100000

## 2011-05-28 ENCOUNTER — Ambulatory Visit (HOSPITAL_COMMUNITY): Payer: BC Managed Care – PPO | Attending: Internal Medicine | Admitting: Radiology

## 2011-05-28 DIAGNOSIS — I079 Rheumatic tricuspid valve disease, unspecified: Secondary | ICD-10-CM | POA: Insufficient documentation

## 2011-05-28 DIAGNOSIS — I059 Rheumatic mitral valve disease, unspecified: Secondary | ICD-10-CM | POA: Insufficient documentation

## 2011-05-28 DIAGNOSIS — Z8249 Family history of ischemic heart disease and other diseases of the circulatory system: Secondary | ICD-10-CM

## 2011-05-28 DIAGNOSIS — R002 Palpitations: Secondary | ICD-10-CM | POA: Insufficient documentation

## 2011-06-03 ENCOUNTER — Telehealth: Payer: Self-pay | Admitting: Internal Medicine

## 2011-06-03 NOTE — Telephone Encounter (Signed)
Requesting test results from last week. (Echo?)

## 2011-06-04 NOTE — Progress Notes (Signed)
Quick Note:  Pt informed ______ 

## 2011-07-30 ENCOUNTER — Ambulatory Visit (INDEPENDENT_AMBULATORY_CARE_PROVIDER_SITE_OTHER): Payer: BC Managed Care – PPO | Admitting: Internal Medicine

## 2011-07-30 ENCOUNTER — Encounter: Payer: Self-pay | Admitting: Internal Medicine

## 2011-07-30 VITALS — BP 110/80 | Temp 97.8°F | Wt 224.0 lb

## 2011-07-30 DIAGNOSIS — R300 Dysuria: Secondary | ICD-10-CM

## 2011-07-30 DIAGNOSIS — N390 Urinary tract infection, site not specified: Secondary | ICD-10-CM

## 2011-07-30 LAB — POCT URINALYSIS DIPSTICK
Bilirubin, UA: NEGATIVE
Glucose, UA: NEGATIVE
Ketones, UA: NEGATIVE
Spec Grav, UA: 1.01

## 2011-07-30 MED ORDER — SULFAMETHOXAZOLE-TRIMETHOPRIM 800-160 MG PO TABS: 1.0000 | ORAL_TABLET | Freq: Two times a day (BID) | ORAL | Status: AC

## 2011-07-30 NOTE — Progress Notes (Signed)
  Subjective:    Patient ID: Alexis Wallace, female    DOB: 1956/02/17, 55 y.o.   MRN: [id number]  HPI  55 year old patient who is seen today complaining of burning dysuria urinary frequency and urgency. She was treated for a culture documented UTI 2 months ago. Denies any fever flank pain or chills. Symptoms have been present for 2 days    Review of Systems  Constitutional: Negative for fever and chills.  Genitourinary: Positive for dysuria, urgency and difficulty urinating. Negative for hematuria and flank pain.       Objective:   Physical Exam  Constitutional: She appears well-developed and well-nourished. No distress.          Assessment & Plan:   Recurrent UTI. Patient was treated with Cipro for 5 days for a culture documented UTI. Organism was  sensitive to the Cipro. Will treat with Septra DS for 7 days

## 2011-07-30 NOTE — Patient Instructions (Signed)
Drink as much fluid as you  can tolerate over the next few days  Take your antibiotic as prescribed until ALL of it is gone, but stop if you develop a rash, swelling, or any side effects of the medication.  Contact our office as soon as possible if  there are side effects of the medication.  Call or return to clinic prn if these symptoms worsen or fail to improve as anticipated.   

## 2011-08-25 ENCOUNTER — Ambulatory Visit: Payer: BC Managed Care – PPO | Admitting: Internal Medicine

## 2011-08-26 ENCOUNTER — Ambulatory Visit (INDEPENDENT_AMBULATORY_CARE_PROVIDER_SITE_OTHER): Payer: BC Managed Care – PPO | Admitting: Internal Medicine

## 2011-08-26 ENCOUNTER — Encounter: Payer: Self-pay | Admitting: Internal Medicine

## 2011-08-26 VITALS — BP 130/80 | HR 72 | Temp 98.3°F | Resp 16 | Ht 68.0 in | Wt 220.0 lb

## 2011-08-26 DIAGNOSIS — L408 Other psoriasis: Secondary | ICD-10-CM

## 2011-08-26 DIAGNOSIS — Z9884 Bariatric surgery status: Secondary | ICD-10-CM

## 2011-08-26 DIAGNOSIS — M1990 Unspecified osteoarthritis, unspecified site: Secondary | ICD-10-CM

## 2011-08-26 DIAGNOSIS — N318 Other neuromuscular dysfunction of bladder: Secondary | ICD-10-CM

## 2011-08-26 DIAGNOSIS — K219 Gastro-esophageal reflux disease without esophagitis: Secondary | ICD-10-CM

## 2011-08-26 DIAGNOSIS — N390 Urinary tract infection, site not specified: Secondary | ICD-10-CM

## 2011-08-26 DIAGNOSIS — E559 Vitamin D deficiency, unspecified: Secondary | ICD-10-CM

## 2011-08-26 MED ORDER — OMEPRAZOLE 20 MG PO CPDR: 20.0000 mg | DELAYED_RELEASE_CAPSULE | Freq: Two times a day (BID) | ORAL | Status: DC

## 2011-08-26 MED ORDER — ERGOCALCIFEROL 1.25 MG (50000 UT) PO CAPS: 50000.0000 [IU] | ORAL_CAPSULE | ORAL | Status: AC

## 2011-08-28 LAB — URINE CULTURE: Colony Count: 25000

## 2011-09-03 ENCOUNTER — Other Ambulatory Visit: Payer: Self-pay | Admitting: Internal Medicine

## 2011-09-03 ENCOUNTER — Telehealth: Payer: Self-pay | Admitting: Internal Medicine

## 2011-09-03 MED ORDER — NITROFURANTOIN MONOHYD MACRO 100 MG PO CAPS: 100.0000 mg | ORAL_CAPSULE | Freq: Two times a day (BID) | ORAL | Status: AC

## 2011-09-03 NOTE — Progress Notes (Signed)
Subjective:    Patient ID: Alexis Wallace, female    DOB: 22-Jan-1956, 55 y.o.   MRN: [id number]  HPI Is a 55 year old white female with history of gastroesophageal reflux osteoarthritis anemia as his factors for anemia include ulceration at the anastomosis of her previous bariatric surgery. And bariatric surgery status with known vitamin deficiencies.  She presents today for routine followup she been gaining weight steadily defeating the purpose of her gastric bypass we had counseling session last visit about diet and exercise and she has her first weight gain and is losing weight at this time.     Review of Systems  Constitutional: Negative for activity change, appetite change and fatigue.  HENT: Negative for ear pain, congestion, neck pain, postnasal drip and sinus pressure.   Eyes: Negative for redness and visual disturbance.  Respiratory: Negative for cough, shortness of breath and wheezing.   Gastrointestinal: Negative for abdominal pain and abdominal distention.  Genitourinary: Negative for dysuria, frequency and menstrual problem.  Musculoskeletal: Negative for myalgias, joint swelling and arthralgias.  Skin: Negative for rash and wound.  Neurological: Negative for dizziness, weakness and headaches.  Hematological: Negative for adenopathy. Does not bruise/bleed easily.  Psychiatric/Behavioral: Negative for sleep disturbance and decreased concentration.   Past Medical History  Diagnosis Date  . Osteoarthritis   . GERD (gastroesophageal reflux disease)   . Osteopenia   . Iron deficiency   . Anemia, iron deficiency   . B12 deficiency   . Allergy    Past Surgical History  Procedure Date  . Gastric bypass 2001  . Cosmetic surgery     tummy tuck    reports that she quit smoking about 33 years ago. She does not have any smokeless tobacco history on file. She reports that she drinks alcohol. She reports that she does not use illicit drugs. family history includes Heart  disease in her father and sister. No Known Allergies     Objective:   Physical Exam  Nursing note and vitals reviewed. Constitutional: She is oriented to person, place, and time. She appears well-developed and well-nourished. No distress.  HENT:  Head: Normocephalic and atraumatic.  Right Ear: External ear normal.  Left Ear: External ear normal.  Nose: Nose normal.  Mouth/Throat: Oropharynx is clear and moist.  Eyes: Conjunctivae and EOM are normal. Pupils are equal, round, and reactive to light.  Neck: Normal range of motion. Neck supple. No JVD present. No tracheal deviation present. No thyromegaly present.  Cardiovascular: Normal rate, regular rhythm, normal heart sounds and intact distal pulses.   No murmur heard. Pulmonary/Chest: Effort normal and breath sounds normal. She has no wheezes. She exhibits no tenderness.  Abdominal: Soft. Bowel sounds are normal.  Musculoskeletal: Normal range of motion. She exhibits no edema and no tenderness.  Lymphadenopathy:    She has no cervical adenopathy.  Neurological: She is alert and oriented to person, place, and time. She has normal reflexes. No cranial nerve deficit.  Skin: Skin is warm and dry. She is not diaphoretic.  Psychiatric: She has a normal mood and affect. Her behavior is normal.          Assessment & Plan:  Monitoring weight loss on a protocol to reduce wasted calories sweets and carbohydrates in the differential he did her gastric bypass surgery weight loss.  Review of her vitamin deficiencies B12 iron deficiency anemia discussion of adjustment disorder with anxiety review of chronic migraines and GERD.  Will continue current medications advanced diet with progressive weight loss  goal of 20 additional pounds over the next 3 months  Review of overactive bladder and adjustment of medications.

## 2011-09-03 NOTE — Telephone Encounter (Signed)
Per dr Georgetta Haber specific bacteria- per dr Lovell Sheehan- give macrobid 100 bid for 10 days

## 2011-09-03 NOTE — Telephone Encounter (Signed)
Wants Alexis Wallace to return call. Was seen with an uti , but never heard the results. Still having sxs. Wants to know what is the next step? Thanks.

## 2011-09-03 NOTE — Telephone Encounter (Signed)
Please advise 

## 2011-09-03 NOTE — Patient Instructions (Signed)
Patient was instructed to continue all medications as prescribed. To stop at the checkout desk and schedule a followup appointment  

## 2011-09-15 ENCOUNTER — Other Ambulatory Visit: Payer: Self-pay | Admitting: Obstetrics & Gynecology

## 2011-09-15 DIAGNOSIS — R928 Other abnormal and inconclusive findings on diagnostic imaging of breast: Secondary | ICD-10-CM

## 2011-09-28 ENCOUNTER — Ambulatory Visit
Admission: RE | Admit: 2011-09-28 | Discharge: 2011-09-28 | Disposition: A | Discharge status: Home / Self Care | Payer: BC Managed Care – PPO | Source: Ambulatory Visit | Attending: Obstetrics & Gynecology | Admitting: Obstetrics & Gynecology

## 2011-09-28 DIAGNOSIS — R928 Other abnormal and inconclusive findings on diagnostic imaging of breast: Secondary | ICD-10-CM

## 2011-09-29 ENCOUNTER — Telehealth: Payer: Self-pay | Admitting: Internal Medicine

## 2011-09-29 NOTE — Telephone Encounter (Signed)
Left message on machine To return call 

## 2011-09-29 NOTE — Telephone Encounter (Signed)
Pt sister had genetic testing and was positive- pt is going to send over test to discuss with dr Lovell Sheehan, if he feels she needs testing

## 2011-09-29 NOTE — Telephone Encounter (Signed)
Would like a call back from [place]. Her sister a genetic disease and would like an opinion  about it. She said that she had labs recently. Thanks.

## 2011-10-19 ENCOUNTER — Other Ambulatory Visit: Payer: Self-pay | Admitting: *Deleted

## 2011-11-09 ENCOUNTER — Ambulatory Visit: Payer: BC Managed Care – PPO | Admitting: Internal Medicine

## 2011-12-03 ENCOUNTER — Other Ambulatory Visit: Payer: Self-pay | Admitting: *Deleted

## 2011-12-03 MED ORDER — TEMAZEPAM 15 MG PO CAPS: 15.0000 mg | ORAL_CAPSULE | Freq: Every evening | ORAL | Status: DC | PRN

## 2011-12-28 ENCOUNTER — Encounter: Payer: Self-pay | Admitting: Internal Medicine

## 2011-12-28 ENCOUNTER — Ambulatory Visit (INDEPENDENT_AMBULATORY_CARE_PROVIDER_SITE_OTHER): Payer: BC Managed Care – PPO | Admitting: Internal Medicine

## 2011-12-28 VITALS — BP 130/80 | HR 76 | Temp 98.3°F | Resp 16 | Ht 68.0 in | Wt 224.0 lb

## 2011-12-28 DIAGNOSIS — K219 Gastro-esophageal reflux disease without esophagitis: Secondary | ICD-10-CM

## 2011-12-28 DIAGNOSIS — Z8249 Family history of ischemic heart disease and other diseases of the circulatory system: Secondary | ICD-10-CM

## 2011-12-28 DIAGNOSIS — F4322 Adjustment disorder with anxiety: Secondary | ICD-10-CM

## 2011-12-28 DIAGNOSIS — E669 Obesity, unspecified: Secondary | ICD-10-CM

## 2011-12-28 DIAGNOSIS — Z9884 Bariatric surgery status: Secondary | ICD-10-CM

## 2011-12-28 MED ORDER — PHENTERMINE HCL 37.5 MG PO CAPS: 37.5000 mg | ORAL_CAPSULE | ORAL | Status: DC

## 2011-12-28 NOTE — Progress Notes (Signed)
Subjective:    Patient ID: Alexis Wallace, female    DOB: 10/14/1956, 56 y.o.   MRN: [id number]  HPI Poor food choices Weight gain is progressive Morbid obesity Patient has one sister of a total of 4 sisters that has hypertrophic cardiomyopathy without obstruction 2 sisters have tested for the genetics for this disease and have been negative including the sister was symptomatic.  Patient had a negative echocardiogram in 2012 which showed no septal hypertrophy.  She is concerned about the risk factors of this disease and the potential that she is passed this disease to a second generation and wishes to know whether or not she should have genetic testing or whether genetic testing would be of any use given the fact that the sister who has symptomatic tested negative for the gene.     Review of Systems  Constitutional: Negative for activity change, appetite change and fatigue.  HENT: Negative for ear pain, congestion, neck pain, postnasal drip and sinus pressure.   Eyes: Negative for redness and visual disturbance.  Respiratory: Negative for cough, shortness of breath and wheezing.   Gastrointestinal: Negative for abdominal pain and abdominal distention.  Genitourinary: Negative for dysuria, frequency and menstrual problem.  Musculoskeletal: Negative for myalgias, joint swelling and arthralgias.  Skin: Negative for rash and wound.  Neurological: Negative for dizziness, weakness and headaches.  Hematological: Negative for adenopathy. Does not bruise/bleed easily.  Psychiatric/Behavioral: Negative for sleep disturbance and decreased concentration.   Past Medical History  Diagnosis Date  . Osteoarthritis   . GERD (gastroesophageal reflux disease)   . Osteopenia   . Iron deficiency   . Anemia, iron deficiency   . B12 deficiency   . Allergy     History   Social History  . Marital Status: Married    Spouse Name: N/A    Number of Children: N/A  . Years of Education: N/A    Occupational History  . Not on file.   Social History Main Topics  . Smoking status: Former Smoker    Quit date: 02/10/1978  . Smokeless tobacco: Not on file  . Alcohol Use: 0.0 oz/week    0 Glasses of wine per week  . Drug Use: No  . Sexually Active: Yes   Other Topics Concern  . Not on file   Social History Narrative  . No narrative on file    Past Surgical History  Procedure Date  . Gastric bypass 2001  . Cosmetic surgery     tummy tuck    Family History  Problem Relation Age of Onset  . Heart disease Father   . Heart disease Sister     Hypertrophic cardiomyopathy    No Known Allergies  Current Outpatient Prescriptions on File Prior to Visit  Medication Sig Dispense Refill  . calcium carbonate (CALCIUM 500) 1250 MG tablet Take by mouth daily. 1000 q day       . Cyanocobalamin (B-12 DOTS) 500 MCG SUBL Place 1 tablet (500 mcg total) under the tongue daily.  30 tablet  6  . cycloSPORINE (RESTASIS) 0.05 % ophthalmic emulsion 1 drop 2 (two) times daily.        Marland Kitchen eletriptan (RELPAX) 40 MG tablet One tablet by mouth as needed for migraine headache.  If the headache improves and then returns, dose may be repeated after 2 hours have elapsed since first dose (do not exceed 80 mg per day). may repeat in 2 hours if necessary       . ergocalciferol (  VITAMIN D2) 50000 UNITS capsule Take 1 capsule (50,000 Units total) by mouth once a week.  5 capsule  12  . estradiol (ESTRACE) 2 MG tablet Take 2 mg by mouth daily.        . ferrous sulfate 325 (65 FE) MG tablet Take 1 tablet (325 mg total) by mouth daily.  30 tablet  6  . Multiple Vitamin (MULTIVITAMIN) capsule Take 1 capsule by mouth daily.        Marland Kitchen omeprazole (PRILOSEC) 20 MG capsule Take 1 capsule (20 mg total) by mouth 2 (two) times daily.  60 capsule  6  . progesterone (PROMETRIUM) 200 MG capsule Take 200 mg by mouth daily. 12 days every 3 months       . temazepam (RESTORIL) 15 MG capsule Take 1 capsule (15 mg total) by  mouth at bedtime as needed.  30 capsule  5    BP 130/80  Pulse 76  Temp 98.3 F (36.8 C)  Resp 16  Ht 5\' 8"  (1.727 m)  Wt 224 lb (101.606 kg)  BMI 34.06 kg/m2       Objective:   Physical Exam  Nursing note and vitals reviewed. Constitutional: She is oriented to person, place, and time. She appears well-developed and well-nourished. No distress.  HENT:  Head: Normocephalic and atraumatic.  Right Ear: External ear normal.  Left Ear: External ear normal.  Nose: Nose normal.  Mouth/Throat: Oropharynx is clear and moist.  Eyes: Conjunctivae and EOM are normal. Pupils are equal, round, and reactive to light.  Neck: Normal range of motion. Neck supple. No JVD present. No tracheal deviation present. No thyromegaly present.  Cardiovascular: Normal rate, regular rhythm, normal heart sounds and intact distal pulses.   No murmur heard. Pulmonary/Chest: Effort normal and breath sounds normal. She has no wheezes. She exhibits no tenderness.  Abdominal: Soft. Bowel sounds are normal.  Musculoskeletal: Normal range of motion. She exhibits no edema and no tenderness.  Lymphadenopathy:    She has no cervical adenopathy.  Neurological: She is alert and oriented to person, place, and time. She has normal reflexes. No cranial nerve deficit.  Skin: Skin is warm and dry. She is not diaphoretic.  Psychiatric: She has a normal mood and affect. Her behavior is normal.          Assessment & Plan:  We'll prescribe phentermine for weight loss to the patient can realize the full benefit of her gastric bypass. she'll return in 4 months but I will draw additional blood work will refer her to do cardiovascular clinic for evaluation of hypertrophic cardiomyopathy

## 2011-12-28 NOTE — Patient Instructions (Signed)
Take the phentermine upon arising

## 2012-03-28 ENCOUNTER — Ambulatory Visit: Payer: BC Managed Care – PPO | Admitting: Internal Medicine

## 2012-04-04 ENCOUNTER — Ambulatory Visit (INDEPENDENT_AMBULATORY_CARE_PROVIDER_SITE_OTHER): Payer: BC Managed Care – PPO | Admitting: Internal Medicine

## 2012-04-04 ENCOUNTER — Encounter: Payer: Self-pay | Admitting: Internal Medicine

## 2012-04-04 VITALS — BP 110/70 | HR 72 | Temp 98.3°F | Resp 16 | Ht 68.0 in | Wt 212.0 lb

## 2012-04-04 DIAGNOSIS — R51 Headache: Secondary | ICD-10-CM

## 2012-04-04 DIAGNOSIS — R519 Headache, unspecified: Secondary | ICD-10-CM

## 2012-04-04 DIAGNOSIS — E669 Obesity, unspecified: Secondary | ICD-10-CM

## 2012-04-04 MED ORDER — PHENTERMINE HCL 37.5 MG PO TABS: 18.7500 mg | ORAL_TABLET | Freq: Every day | ORAL | Status: DC

## 2012-04-04 MED ORDER — TOPIRAMATE 25 MG PO TABS: 25.0000 mg | ORAL_TABLET | Freq: Two times a day (BID) | ORAL | Status: DC

## 2012-04-04 NOTE — Patient Instructions (Signed)
The patient is instructed to continue all medications as prescribed. Schedule followup with check out clerk upon leaving the clinic  

## 2012-04-04 NOTE — Progress Notes (Signed)
Subjective:    Patient ID: Alexis Wallace, female    DOB: 01-23-1956, 56 y.o.   MRN: [id number]  HPI  Was taking the phentermine and the pt was noted to have elevated blood pressure so she stopped the medication.... She stated that the blood pressure was elevated as high as 156/90  Review of Systems  Constitutional: Negative for activity change, appetite change and fatigue.  HENT: Negative for ear pain, congestion, neck pain, postnasal drip and sinus pressure.   Eyes: Negative for redness and visual disturbance.  Respiratory: Negative for cough, shortness of breath and wheezing.   Gastrointestinal: Negative for abdominal pain and abdominal distention.  Genitourinary: Negative for dysuria, frequency and menstrual problem.  Musculoskeletal: Negative for myalgias, joint swelling and arthralgias.  Skin: Negative for rash and wound.  Neurological: Negative for dizziness, weakness and headaches.  Hematological: Negative for adenopathy. Does not bruise/bleed easily.  Psychiatric/Behavioral: Negative for sleep disturbance and decreased concentration.   Past Medical History  Diagnosis Date  . Osteoarthritis   . GERD (gastroesophageal reflux disease)   . Osteopenia   . Iron deficiency   . Anemia, iron deficiency   . B12 deficiency   . Allergy     History   Social History  . Marital Status: Married    Spouse Name: N/A    Number of Children: N/A  . Years of Education: N/A   Occupational History  . Not on file.   Social History Main Topics  . Smoking status: Former Smoker    Quit date: 02/10/1978  . Smokeless tobacco: Not on file  . Alcohol Use: 0.0 oz/week    0 Glasses of wine per week  . Drug Use: No  . Sexually Active: Yes   Other Topics Concern  . Not on file   Social History Narrative  . No narrative on file    Past Surgical History  Procedure Date  . Gastric bypass 2001  . Cosmetic surgery     tummy tuck    Family History  Problem Relation Age of Onset    . Heart disease Father   . Heart disease Sister     Hypertrophic cardiomyopathy    No Known Allergies  Current Outpatient Prescriptions on File Prior to Visit  Medication Sig Dispense Refill  . calcium carbonate (CALCIUM 500) 1250 MG tablet Take by mouth daily. 1000 q day       . Cyanocobalamin (B-12 DOTS) 500 MCG SUBL Place 1 tablet (500 mcg total) under the tongue daily.  30 tablet  6  . cycloSPORINE (RESTASIS) 0.05 % ophthalmic emulsion 1 drop 2 (two) times daily.        Marland Kitchen eletriptan (RELPAX) 40 MG tablet One tablet by mouth as needed for migraine headache.  If the headache improves and then returns, dose may be repeated after 2 hours have elapsed since first dose (do not exceed 80 mg per day). may repeat in 2 hours if necessary       . ergocalciferol (VITAMIN D2) 50000 UNITS capsule Take 1 capsule (50,000 Units total) by mouth once a week.  5 capsule  12  . estradiol (ESTRACE) 2 MG tablet Take 2 mg by mouth daily.        . ferrous sulfate 325 (65 FE) MG tablet Take 1 tablet (325 mg total) by mouth daily.  30 tablet  6  . Multiple Vitamin (MULTIVITAMIN) capsule Take 1 capsule by mouth daily.        Marland Kitchen omeprazole (PRILOSEC) 20  MG capsule Take 1 capsule (20 mg total) by mouth 2 (two) times daily.  60 capsule  6  . progesterone (PROMETRIUM) 200 MG capsule Take 200 mg by mouth daily. 12 days every 3 months       . temazepam (RESTORIL) 15 MG capsule Take 1 capsule (15 mg total) by mouth at bedtime as needed.  30 capsule  5    BP 110/70  Pulse 72  Temp 98.3 F (36.8 C)  Resp 16  Ht 5\' 8"  (1.727 m)  Wt 212 lb (96.163 kg)  BMI 32.23 kg/m2       Objective:   Physical Exam  Nursing note and vitals reviewed. Constitutional: She is oriented to person, place, and time. She appears well-developed and well-nourished. No distress.  HENT:  Head: Normocephalic and atraumatic.  Right Ear: External ear normal.  Left Ear: External ear normal.  Nose: Nose normal.  Mouth/Throat: Oropharynx is  clear and moist.  Eyes: Conjunctivae and EOM are normal. Pupils are equal, round, and reactive to light.  Neck: Normal range of motion. Neck supple. No JVD present. No tracheal deviation present. No thyromegaly present.  Cardiovascular: Normal rate, regular rhythm, normal heart sounds and intact distal pulses.   No murmur heard. Pulmonary/Chest: Effort normal and breath sounds normal. She has no wheezes. She exhibits no tenderness.  Abdominal: Soft. Bowel sounds are normal.  Musculoskeletal: Normal range of motion. She exhibits no edema and no tenderness.  Lymphadenopathy:    She has no cervical adenopathy.  Neurological: She is alert and oriented to person, place, and time. She has normal reflexes. No cranial nerve deficit.  Skin: Skin is warm and dry. She is not diaphoretic.  Psychiatric: She has a normal mood and affect. Her behavior is normal.          Assessment & Plan:  The pt has family hx of HOCM and was seen at [facility] for the genetic screening This is a complicated history status post bariatric surgery with significant weight gain we have been using a combination of phentermine and Topamax is no added for rate control also to allow [place] to decrease the phentermine does with this but her blood pressure.  She will monitor blood pressure carefully while this medication also keep a record of her weights as a goal for 15 pounds of weight loss with extreme bouts.  She is to the standpoint of her gastro-

## 2012-05-05 ENCOUNTER — Ambulatory Visit (INDEPENDENT_AMBULATORY_CARE_PROVIDER_SITE_OTHER): Payer: BC Managed Care – PPO | Admitting: Internal Medicine

## 2012-05-05 ENCOUNTER — Encounter: Payer: Self-pay | Admitting: Internal Medicine

## 2012-05-05 VITALS — BP 140/84 | HR 72 | Temp 98.6°F | Resp 16 | Ht 68.0 in | Wt 208.0 lb

## 2012-05-05 DIAGNOSIS — T17800A Unspecified foreign body in other parts of respiratory tract causing asphyxiation, initial encounter: Secondary | ICD-10-CM

## 2012-05-05 DIAGNOSIS — R51 Headache: Secondary | ICD-10-CM

## 2012-05-05 DIAGNOSIS — T17908A Unspecified foreign body in respiratory tract, part unspecified causing other injury, initial encounter: Secondary | ICD-10-CM

## 2012-05-05 DIAGNOSIS — E669 Obesity, unspecified: Secondary | ICD-10-CM

## 2012-05-05 DIAGNOSIS — R519 Headache, unspecified: Secondary | ICD-10-CM

## 2012-05-05 MED ORDER — AMOXICILLIN-POT CLAVULANATE 875-125 MG PO TABS: 1.0000 | ORAL_TABLET | Freq: Two times a day (BID) | ORAL | Status: AC

## 2012-05-05 MED ORDER — TOPIRAMATE 25 MG PO TABS: 25.0000 mg | ORAL_TABLET | Freq: Two times a day (BID) | ORAL | Status: DC

## 2012-05-05 NOTE — Progress Notes (Signed)
  Subjective:    Patient ID: Alexis Wallace, female    DOB: July 26, 1956, 56 y.o.   MRN: [id number]  HPI  Patient is a 56 year old female status post bariatric surgery with a history of iron deficiency and B12 deficiency anemia who reports that she had a severe episode of GERD with reflux and that she possibly aspirated.  Since that episode she has had a deep hacking cough. No fever or chills.  Review of Systems  Constitutional: Negative for activity change, appetite change and fatigue.  HENT: Negative for ear pain, congestion, neck pain, postnasal drip and sinus pressure.   Eyes: Negative for redness and visual disturbance.  Respiratory: Positive for cough. Negative for shortness of breath and wheezing.   Gastrointestinal: Negative for abdominal pain and abdominal distention.  Genitourinary: Negative for dysuria, frequency and menstrual problem.  Musculoskeletal: Negative for myalgias, joint swelling and arthralgias.  Skin: Negative for rash and wound.  Neurological: Negative for dizziness, weakness and headaches.  Hematological: Negative for adenopathy. Does not bruise/bleed easily.  Psychiatric/Behavioral: Negative for sleep disturbance and decreased concentration.       Objective:   Physical Exam  Constitutional: She is oriented to person, place, and time. She appears well-developed and well-nourished. No distress.  HENT:  Head: Normocephalic and atraumatic.  Right Ear: External ear normal.  Left Ear: External ear normal.  Nose: Nose normal.  Mouth/Throat: Oropharynx is clear and moist.  Eyes: Conjunctivae and EOM are normal. Pupils are equal, round, and reactive to light.  Neck: Normal range of motion. Neck supple. No JVD present. No tracheal deviation present. No thyromegaly present.  Cardiovascular: Normal rate, regular rhythm, normal heart sounds and intact distal pulses.   No murmur heard. Pulmonary/Chest: Effort normal. She has no wheezes. She has rales. She exhibits  no tenderness.  Abdominal: Soft. Bowel sounds are normal.  Musculoskeletal: Normal range of motion. She exhibits no edema and no tenderness.  Lymphadenopathy:    She has no cervical adenopathy.  Neurological: She is alert and oriented to person, place, and time. She has normal reflexes. No cranial nerve deficit.  Skin: Skin is warm and dry. She is not diaphoretic.  Psychiatric: She has a normal mood and affect. Her behavior is normal.          Assessment & Plan:  Possible aspiration pneumonitis prescribed Augmentin 875 twice a day for 10

## 2012-05-05 NOTE — Patient Instructions (Signed)
The patient is instructed to continue all medications as prescribed. Schedule followup with check out clerk upon leaving the clinic  

## 2012-07-04 ENCOUNTER — Ambulatory Visit: Payer: BC Managed Care – PPO | Admitting: Internal Medicine

## 2012-07-05 ENCOUNTER — Ambulatory Visit (INDEPENDENT_AMBULATORY_CARE_PROVIDER_SITE_OTHER): Payer: BC Managed Care – PPO | Admitting: Internal Medicine

## 2012-07-05 ENCOUNTER — Encounter: Payer: Self-pay | Admitting: Internal Medicine

## 2012-07-05 VITALS — BP 138/92 | HR 68 | Temp 98.0°F | Wt 209.0 lb

## 2012-07-05 DIAGNOSIS — Z9884 Bariatric surgery status: Secondary | ICD-10-CM

## 2012-07-05 DIAGNOSIS — D649 Anemia, unspecified: Secondary | ICD-10-CM

## 2012-07-05 DIAGNOSIS — M949 Disorder of cartilage, unspecified: Secondary | ICD-10-CM

## 2012-07-05 DIAGNOSIS — F411 Generalized anxiety disorder: Secondary | ICD-10-CM

## 2012-07-05 DIAGNOSIS — E669 Obesity, unspecified: Secondary | ICD-10-CM

## 2012-07-05 DIAGNOSIS — F419 Anxiety disorder, unspecified: Secondary | ICD-10-CM

## 2012-07-05 DIAGNOSIS — M8580 Other specified disorders of bone density and structure, unspecified site: Secondary | ICD-10-CM

## 2012-07-05 DIAGNOSIS — M899 Disorder of bone, unspecified: Secondary | ICD-10-CM

## 2012-07-05 DIAGNOSIS — E785 Hyperlipidemia, unspecified: Secondary | ICD-10-CM

## 2012-07-05 LAB — CBC WITH DIFFERENTIAL/PLATELET
Basophils Absolute: 0 10*3/uL (ref 0.0–0.1)
Eosinophils Relative: 4.1 % (ref 0.0–5.0)
Lymphocytes Relative: 32.6 % (ref 12.0–46.0)
Monocytes Relative: 5.7 % (ref 3.0–12.0)
Neutrophils Relative %: 56.6 % (ref 43.0–77.0)
Platelets: 176 10*3/uL (ref 150.0–400.0)
RDW: 12.7 % (ref 11.5–14.6)
WBC: 4.6 10*3/uL (ref 4.5–10.5)

## 2012-07-05 LAB — BASIC METABOLIC PANEL
BUN: 10 mg/dL (ref 6–23)
CO2: 29 mEq/L (ref 19–32)
Calcium: 9 mg/dL (ref 8.4–10.5)
Chloride: 103 mEq/L (ref 96–112)
Creatinine, Ser: 0.5 mg/dL (ref 0.4–1.2)

## 2012-07-05 LAB — TSH: TSH: 1.95 u[IU]/mL (ref 0.35–5.50)

## 2012-07-05 LAB — IRON: Iron: 109 ug/dL (ref 42–145)

## 2012-07-05 LAB — HEPATIC FUNCTION PANEL
ALT: 24 U/L (ref 0–35)
Bilirubin, Direct: 0.1 mg/dL (ref 0.0–0.3)
Total Protein: 6.4 g/dL (ref 6.0–8.3)

## 2012-07-05 MED ORDER — ALPRAZOLAM 0.25 MG PO TABS: 0.2500 mg | ORAL_TABLET | Freq: Two times a day (BID) | ORAL | Status: AC | PRN

## 2012-07-05 MED ORDER — PHENTERMINE HCL 37.5 MG PO TABS: 18.7500 mg | ORAL_TABLET | Freq: Every day | ORAL | Status: DC

## 2012-07-05 NOTE — Progress Notes (Signed)
Subjective:    Patient ID: Alexis Wallace, female    DOB: 08/06/1956, 56 y.o.   MRN: [id number]  HPI  discussion weight loss  Review of Systems  Constitutional: Negative for activity change, appetite change and fatigue.  HENT: Negative for ear pain, congestion, neck pain, postnasal drip and sinus pressure.   Eyes: Negative for redness and visual disturbance.  Respiratory: Negative for cough, shortness of breath and wheezing.   Gastrointestinal: Negative for abdominal pain and abdominal distention.  Genitourinary: Negative for dysuria, frequency and menstrual problem.  Musculoskeletal: Negative for myalgias, joint swelling and arthralgias.  Skin: Negative for rash and wound.  Neurological: Negative for dizziness, weakness and headaches.  Hematological: Negative for adenopathy. Does not bruise/bleed easily.  Psychiatric/Behavioral: Negative for disturbed wake/sleep cycle and decreased concentration.   Past Medical History  Diagnosis Date  . Osteoarthritis   . GERD (gastroesophageal reflux disease)   . Osteopenia   . Iron deficiency   . Anemia, iron deficiency   . B12 deficiency   . Allergy     History   Social History  . Marital Status: Married    Spouse Name: N/A    Number of Children: N/A  . Years of Education: N/A   Occupational History  . Not on file.   Social History Main Topics  . Smoking status: Former Smoker    Quit date: 02/10/1978  . Smokeless tobacco: Not on file  . Alcohol Use: 0.0 oz/week    0 Glasses of wine per week  . Drug Use: No  . Sexually Active: Yes   Other Topics Concern  . Not on file   Social History Narrative  . No narrative on file    Past Surgical History  Procedure Date  . Gastric bypass 2001  . Cosmetic surgery     tummy tuck    Family History  Problem Relation Age of Onset  . Heart disease Father   . Heart disease Sister     Hypertrophic cardiomyopathy    No Known Allergies  Current Outpatient Prescriptions on  File Prior to Visit  Medication Sig Dispense Refill  . calcium carbonate (CALCIUM 500) 1250 MG tablet Take by mouth daily. 1000 q day       . Cyanocobalamin (B-12 DOTS) 500 MCG SUBL Place 1 tablet (500 mcg total) under the tongue daily.  30 tablet  6  . ergocalciferol (VITAMIN D2) 50000 UNITS capsule Take 1 capsule (50,000 Units total) by mouth once a week.  5 capsule  12  . estradiol (ESTRACE) 2 MG tablet Take 2 mg by mouth daily.        . ferrous sulfate 325 (65 FE) MG tablet Take 325 mg by mouth daily.      . Multiple Vitamin (MULTIVITAMIN) capsule Take 1 capsule by mouth daily.        Marland Kitchen omeprazole (PRILOSEC) 20 MG capsule Take 1 capsule (20 mg total) by mouth 2 (two) times daily.  60 capsule  6  . phentermine (ADIPEX-P) 37.5 MG tablet Take 18.75 mg by mouth daily before breakfast.      . progesterone (PROMETRIUM) 200 MG capsule Take 200 mg by mouth daily. 12 days every 3 months       . temazepam (RESTORIL) 15 MG capsule Take 1 capsule (15 mg total) by mouth at bedtime as needed.  30 capsule  5  . topiramate (TOPAMAX) 25 MG tablet Take 1 tablet (25 mg total) by mouth 2 (two) times daily.  60 tablet  3  . URELLE (URELLE/URISED) 81 MG TABS Take 1 tablet by mouth 3 (three) times daily.      Marland Kitchen DISCONTD: phentermine 37.5 MG capsule Take 37.5 mg by mouth every morning.        BP 138/92  Pulse 68  Temp 98 F (36.7 C) (Oral)  Wt 209 lb (94.802 kg)       Objective:   Physical Exam  Nursing note and vitals reviewed. Constitutional: She is oriented to person, place, and time. She appears well-developed and well-nourished. No distress.  HENT:  Head: Normocephalic and atraumatic.  Right Ear: External ear normal.  Left Ear: External ear normal.  Nose: Nose normal.  Mouth/Throat: Oropharynx is clear and moist.  Eyes: Conjunctivae and EOM are normal. Pupils are equal, round, and reactive to light.  Neck: Normal range of motion. Neck supple. No JVD present. No tracheal deviation present. No  thyromegaly present.  Cardiovascular: Normal rate, regular rhythm, normal heart sounds and intact distal pulses.   No murmur heard. Pulmonary/Chest: Effort normal and breath sounds normal. She has no wheezes. She exhibits no tenderness.  Abdominal: Soft. Bowel sounds are normal.  Musculoskeletal: Normal range of motion. She exhibits no edema and no tenderness.  Lymphadenopathy:    She has no cervical adenopathy.  Neurological: She is alert and oriented to person, place, and time. She has normal reflexes. No cranial nerve deficit.  Skin: Skin is warm and dry. She is not diaphoretic.  Psychiatric: She has a normal mood and affect. Her behavior is normal.          Assessment & Plan:

## 2012-07-05 NOTE — Patient Instructions (Addendum)
The patient is instructed to continue all medications as prescribed. Schedule followup with check out clerk upon leaving the clinic  

## 2012-07-06 LAB — VITAMIN B12: Vitamin B-12: 217 pg/mL (ref 211–911)

## 2012-07-06 LAB — VITAMIN D 25 HYDROXY (VIT D DEFICIENCY, FRACTURES): Vit D, 25-Hydroxy: 21 ng/mL — ABNORMAL LOW (ref 30–89)

## 2012-08-15 ENCOUNTER — Other Ambulatory Visit: Payer: Self-pay | Admitting: *Deleted

## 2012-08-15 MED ORDER — TEMAZEPAM 15 MG PO CAPS: 15.0000 mg | ORAL_CAPSULE | Freq: Every evening | ORAL | Status: DC | PRN

## 2012-10-03 ENCOUNTER — Other Ambulatory Visit: Payer: Self-pay | Admitting: *Deleted

## 2012-10-03 ENCOUNTER — Ambulatory Visit (INDEPENDENT_AMBULATORY_CARE_PROVIDER_SITE_OTHER): Payer: BC Managed Care – PPO | Admitting: Internal Medicine

## 2012-10-03 ENCOUNTER — Encounter: Payer: Self-pay | Admitting: Internal Medicine

## 2012-10-03 VITALS — BP 136/84 | HR 76 | Temp 98.3°F | Resp 16 | Ht 68.0 in | Wt 206.0 lb

## 2012-10-03 DIAGNOSIS — G4700 Insomnia, unspecified: Secondary | ICD-10-CM

## 2012-10-03 DIAGNOSIS — K219 Gastro-esophageal reflux disease without esophagitis: Secondary | ICD-10-CM

## 2012-10-03 DIAGNOSIS — Z9884 Bariatric surgery status: Secondary | ICD-10-CM

## 2012-10-03 DIAGNOSIS — N318 Other neuromuscular dysfunction of bladder: Secondary | ICD-10-CM

## 2012-10-03 DIAGNOSIS — J309 Allergic rhinitis, unspecified: Secondary | ICD-10-CM

## 2012-10-03 DIAGNOSIS — E669 Obesity, unspecified: Secondary | ICD-10-CM

## 2012-10-03 DIAGNOSIS — Z23 Encounter for immunization: Secondary | ICD-10-CM

## 2012-10-03 DIAGNOSIS — L719 Rosacea, unspecified: Secondary | ICD-10-CM

## 2012-10-03 DIAGNOSIS — D509 Iron deficiency anemia, unspecified: Secondary | ICD-10-CM

## 2012-10-03 MED ORDER — PHENTERMINE HCL 37.5 MG PO TABS: 37.5000 mg | ORAL_TABLET | Freq: Every day | ORAL | Status: DC

## 2012-10-03 MED ORDER — TEMAZEPAM 15 MG PO CAPS: 15.0000 mg | ORAL_CAPSULE | Freq: Every evening | ORAL | Status: DC | PRN

## 2012-10-03 MED ORDER — CLINDAMYCIN PHOSPHATE 1 % EX GEL: Freq: Two times a day (BID) | CUTANEOUS | Status: DC

## 2012-10-03 MED ORDER — OMEPRAZOLE 20 MG PO CPDR: 20.0000 mg | DELAYED_RELEASE_CAPSULE | Freq: Two times a day (BID) | ORAL | Status: DC

## 2012-10-03 NOTE — Progress Notes (Signed)
  Subjective:    Patient ID: Alexis Wallace, female    DOB: [birth year], 56 y.o.   MRN: [id number]  HPI  Follow up for overactive bladder and frequent UTIs Has ben stable off the medications and without frequent UTI UA at GYN was negative Blood pressure stable weight continues to slowly decrease.   Review of Systems  Constitutional: Negative for activity change, appetite change and fatigue.  HENT: Negative for ear pain, congestion, neck pain, postnasal drip and sinus pressure.   Eyes: Negative for redness and visual disturbance.  Respiratory: Negative for cough, shortness of breath and wheezing.   Gastrointestinal: Negative for abdominal pain and abdominal distention.  Genitourinary: Negative for dysuria, frequency and menstrual problem.  Musculoskeletal: Negative for myalgias, joint swelling and arthralgias.  Skin: Negative for rash and wound.  Neurological: Negative for dizziness, weakness and headaches.  Hematological: Negative for adenopathy. Does not bruise/bleed easily.  Psychiatric/Behavioral: Negative for disturbed wake/sleep cycle and decreased concentration.       Objective:   Physical Exam  Nursing note and vitals reviewed. Constitutional: She is oriented to person, place, and time. She appears well-developed and well-nourished. No distress.  HENT:  Head: Normocephalic and atraumatic.  Right Ear: External ear normal.  Left Ear: External ear normal.  Nose: Nose normal.  Mouth/Throat: Oropharynx is clear and moist.  Eyes: Conjunctivae normal and EOM are normal. Pupils are equal, round, and reactive to light.  Neck: Normal range of motion. Neck supple. No JVD present. No tracheal deviation present. No thyromegaly present.  Cardiovascular: Normal rate, regular rhythm, normal heart sounds and intact distal pulses.   No murmur heard. Pulmonary/Chest: Effort normal and breath sounds normal. She has no wheezes. She exhibits no tenderness.  Abdominal: Soft. Bowel sounds  are normal.  Musculoskeletal: Normal range of motion. She exhibits no edema and no tenderness.  Lymphadenopathy:    She has no cervical adenopathy.  Neurological: She is alert and oriented to person, place, and time. She has normal reflexes. No cranial nerve deficit.  Skin: Skin is warm and dry. Rash noted. She is not diaphoretic.  Psychiatric: She has a normal mood and affect. Her behavior is normal.          Assessment & Plan:  Bariatric status stable monitoring for vitamin deficiencies Stable on the phentermine protocol Blood pressure stable  Cleocin  Topical trial for rosacea

## 2012-10-03 NOTE — Patient Instructions (Signed)
The patient is instructed to continue all medications as prescribed. Schedule followup with check out clerk upon leaving the clinic  

## 2012-10-03 NOTE — Addendum Note (Signed)
Addended by: Bonnye Fava on: 10/03/2012 11:45 AM   Modules accepted: Orders

## 2013-01-25 ENCOUNTER — Ambulatory Visit: Payer: BC Managed Care – PPO | Admitting: Internal Medicine

## 2013-01-31 ENCOUNTER — Other Ambulatory Visit: Payer: Self-pay | Admitting: *Deleted

## 2013-01-31 MED ORDER — ALPRAZOLAM 0.25 MG PO TABS
0.2500 mg | ORAL_TABLET | Freq: Two times a day (BID) | ORAL | Status: DC | PRN
Start: 2013-01-31 — End: 2014-03-22

## 2013-04-07 ENCOUNTER — Other Ambulatory Visit: Payer: Self-pay | Admitting: *Deleted

## 2013-04-07 DIAGNOSIS — E669 Obesity, unspecified: Secondary | ICD-10-CM

## 2013-04-07 DIAGNOSIS — G4700 Insomnia, unspecified: Secondary | ICD-10-CM

## 2013-04-07 MED ORDER — TEMAZEPAM 15 MG PO CAPS: 15.0000 mg | ORAL_CAPSULE | Freq: Every evening | ORAL | Status: DC | PRN

## 2013-04-07 MED ORDER — PHENTERMINE HCL 37.5 MG PO TABS: 37.5000 mg | ORAL_TABLET | Freq: Every day | ORAL | Status: DC

## 2013-04-21 ENCOUNTER — Ambulatory Visit (INDEPENDENT_AMBULATORY_CARE_PROVIDER_SITE_OTHER): Payer: BC Managed Care – PPO | Admitting: Internal Medicine

## 2013-04-21 ENCOUNTER — Encounter: Payer: Self-pay | Admitting: Internal Medicine

## 2013-04-21 VITALS — BP 124/78 | HR 76 | Temp 98.6°F | Resp 16 | Ht 68.0 in | Wt 210.0 lb

## 2013-04-21 DIAGNOSIS — D509 Iron deficiency anemia, unspecified: Secondary | ICD-10-CM

## 2013-04-21 DIAGNOSIS — Z9884 Bariatric surgery status: Secondary | ICD-10-CM

## 2013-04-21 DIAGNOSIS — N318 Other neuromuscular dysfunction of bladder: Secondary | ICD-10-CM

## 2013-04-21 DIAGNOSIS — F4322 Adjustment disorder with anxiety: Secondary | ICD-10-CM

## 2013-04-21 DIAGNOSIS — E669 Obesity, unspecified: Secondary | ICD-10-CM

## 2013-04-21 DIAGNOSIS — D518 Other vitamin B12 deficiency anemias: Secondary | ICD-10-CM

## 2013-04-21 LAB — COMPREHENSIVE METABOLIC PANEL
ALT: 23 U/L (ref 0–35)
Albumin: 3.5 g/dL (ref 3.5–5.2)
CO2: 31 mEq/L (ref 19–32)
Calcium: 8.8 mg/dL (ref 8.4–10.5)
Chloride: 103 mEq/L (ref 96–112)
GFR: 108.64 mL/min (ref >60.00)
Potassium: 4.1 mEq/L (ref 3.5–5.1)
Sodium: 138 mEq/L (ref 135–145)
Total Protein: 6.2 g/dL (ref 6.0–8.3)

## 2013-04-21 LAB — IRON: Iron: 86 ug/dL (ref 42–145)

## 2013-04-21 LAB — CBC WITH DIFFERENTIAL/PLATELET
Basophils Relative: 1.4 % (ref 0.0–3.0)
Eosinophils Relative: 4.7 % (ref 0.0–5.0)
Monocytes Relative: 5.2 % (ref 3.0–12.0)
Neutrophils Relative %: 56.6 % (ref 43.0–77.0)
Platelets: 196 10*3/uL (ref 150.0–400.0)
RBC: 4.15 Mil/uL (ref 3.87–5.11)
WBC: 5.8 10*3/uL (ref 4.5–10.5)

## 2013-04-21 LAB — VITAMIN B12: Vitamin B-12: 315 pg/mL (ref 211–911)

## 2013-04-21 MED ORDER — LORCASERIN HCL 10 MG PO TABS: 1.0000 | ORAL_TABLET | Freq: Two times a day (BID) | ORAL | Status: DC

## 2013-04-21 NOTE — Patient Instructions (Signed)
.  first evaluation for new weight loss drug and 15 day trial given

## 2013-04-21 NOTE — Progress Notes (Signed)
Subjective:    Patient ID: Alexis Wallace, female    DOB: 07/04/1956, 57 y.o.   MRN: [id number]  HPI    Review of Systems  Constitutional: Negative for activity change, appetite change and fatigue.  HENT: Negative for ear pain, congestion, neck pain, postnasal drip and sinus pressure.   Eyes: Negative for redness and visual disturbance.  Respiratory: Negative for cough, shortness of breath and wheezing.   Gastrointestinal: Negative for abdominal pain and abdominal distention.  Genitourinary: Negative for dysuria, frequency and menstrual problem.  Musculoskeletal: Negative for myalgias, joint swelling and arthralgias.  Skin: Negative for rash and wound.  Neurological: Negative for dizziness, weakness and headaches.  Hematological: Negative for adenopathy. Does not bruise/bleed easily.  Psychiatric/Behavioral: Negative for sleep disturbance and decreased concentration.   Past Medical History  Diagnosis Date  . Osteoarthritis   . GERD (gastroesophageal reflux disease)   . Osteopenia   . Iron deficiency   . Anemia, iron deficiency   . B12 deficiency   . Allergy     History   Social History  . Marital Status: Married    Spouse Name: N/A    Number of Children: N/A  . Years of Education: N/A   Occupational History  . Not on file.   Social History Main Topics  . Smoking status: Former Smoker    Quit date: 02/10/1978  . Smokeless tobacco: Not on file  . Alcohol Use: 0.0 oz/week    0 Glasses of wine per week  . Drug Use: No  . Sexually Active: Yes   Other Topics Concern  . Not on file   Social History Narrative  . No narrative on file    Past Surgical History  Procedure Laterality Date  . Gastric bypass  2001  . Cosmetic surgery      tummy tuck    Family History  Problem Relation Age of Onset  . Heart disease Father   . Heart disease Sister     Hypertrophic cardiomyopathy    No Known Allergies  Current Outpatient Prescriptions on File Prior to  Visit  Medication Sig Dispense Refill  . ALPRAZolam (XANAX) 0.25 MG tablet Take 1 tablet (0.25 mg total) by mouth 2 (two) times daily as needed for sleep.  60 tablet  3  . clindamycin (CLINDAGEL) 1 % gel Apply topically 2 (two) times daily.  60 g  3  . estradiol (ESTRACE) 2 MG tablet Take 2 mg by mouth daily.        . Multiple Vitamin (MULTIVITAMIN) capsule Take 1 capsule by mouth daily.        Marland Kitchen omeprazole (PRILOSEC) 20 MG capsule Take 1 capsule (20 mg total) by mouth 2 (two) times daily.  60 capsule  11  . phentermine (ADIPEX-P) 37.5 MG tablet Take 1 tablet (37.5 mg total) by mouth daily before breakfast.  30 tablet  3  . progesterone (PROMETRIUM) 200 MG capsule Take 200 mg by mouth daily. 12 days every 3 months       . temazepam (RESTORIL) 15 MG capsule Take 1 capsule (15 mg total) by mouth at bedtime as needed.  30 capsule  3   No current facility-administered medications on file prior to visit.    BP 124/78  Pulse 76  Temp(Src) 98.6 F (37 C)  Resp 16  Ht 5\' 8"  (1.727 m)  Wt 210 lb (95.255 kg)  BMI 31.94 kg/m2       Objective:   Physical Exam  Nursing note and  vitals reviewed. Constitutional: She is oriented to person, place, and time. She appears well-developed and well-nourished. No distress.  HENT:  Head: Normocephalic and atraumatic.  Eyes: Conjunctivae and EOM are normal. Pupils are equal, round, and reactive to light.  Neck: Normal range of motion. Neck supple. No JVD present. No tracheal deviation present. No thyromegaly present.  Cardiovascular: Normal rate and regular rhythm.   No murmur heard. Pulmonary/Chest: Effort normal and breath sounds normal. She has no wheezes. She exhibits no tenderness.  Abdominal: Soft. Bowel sounds are normal.  Musculoskeletal: Normal range of motion. She exhibits no edema and no tenderness.  Lymphadenopathy:    She has no cervical adenopathy.  Neurological: She is alert and oriented to person, place, and time. She has normal  reflexes. No cranial nerve deficit.  Skin: Skin is warm and dry. She is not diaphoretic.  Psychiatric: She has a normal mood and affect. Her behavior is normal.          Assessment & Plan:   I have spent more than 30 minutes examining this patient face-to-face of which over half was spent in counseling Appropriate monitoring for bariatric surgery status including CBC B12 and comprehensive metabolic panel.  Discussed weight loss drugs pros and cons and samples given of new weight loss medication

## 2013-05-11 ENCOUNTER — Telehealth: Payer: Self-pay | Admitting: Internal Medicine

## 2013-05-11 NOTE — Telephone Encounter (Addendum)
Pt would like bonnie to return her call concerning increasing her belviq. Pt stated she takes 10 mg twice a day and med is not  working

## 2013-05-12 ENCOUNTER — Other Ambulatory Visit: Payer: Self-pay | Admitting: *Deleted

## 2013-05-12 MED ORDER — PHENTERMINE HCL 37.5 MG PO CAPS: 37.5000 mg | ORAL_CAPSULE | ORAL | Status: DC

## 2013-05-12 NOTE — Telephone Encounter (Signed)
Per dr Lovell Sheehan- already taking max dose- can not increase dose- can go back on phentermine- per pt that is what she wants- called to pharmacy

## 2013-08-28 ENCOUNTER — Encounter: Payer: Self-pay | Admitting: Internal Medicine

## 2013-08-28 ENCOUNTER — Ambulatory Visit (INDEPENDENT_AMBULATORY_CARE_PROVIDER_SITE_OTHER): Payer: BC Managed Care – PPO | Admitting: Internal Medicine

## 2013-08-28 VITALS — BP 130/80 | HR 72 | Temp 97.9°F | Resp 16 | Ht 68.0 in | Wt 217.0 lb

## 2013-08-28 DIAGNOSIS — Z23 Encounter for immunization: Secondary | ICD-10-CM

## 2013-08-28 DIAGNOSIS — F4322 Adjustment disorder with anxiety: Secondary | ICD-10-CM

## 2013-08-28 DIAGNOSIS — F909 Attention-deficit hyperactivity disorder, unspecified type: Secondary | ICD-10-CM

## 2013-08-28 DIAGNOSIS — D509 Iron deficiency anemia, unspecified: Secondary | ICD-10-CM

## 2013-08-28 DIAGNOSIS — D518 Other vitamin B12 deficiency anemias: Secondary | ICD-10-CM

## 2013-08-28 DIAGNOSIS — N200 Calculus of kidney: Secondary | ICD-10-CM

## 2013-08-28 DIAGNOSIS — Z9884 Bariatric surgery status: Secondary | ICD-10-CM

## 2013-08-28 DIAGNOSIS — G4700 Insomnia, unspecified: Secondary | ICD-10-CM

## 2013-08-28 LAB — POCT URINALYSIS DIPSTICK
Bilirubin, UA: NEGATIVE
Blood, UA: NEGATIVE
Nitrite, UA: NEGATIVE
Protein, UA: NEGATIVE
Urobilinogen, UA: 0.2
pH, UA: 5.5

## 2013-08-28 MED ORDER — AMPHETAMINE-DEXTROAMPHET ER 30 MG PO CP24: 30.0000 mg | ORAL_CAPSULE | ORAL | Status: DC

## 2013-08-28 MED ORDER — TEMAZEPAM 15 MG PO CAPS: 15.0000 mg | ORAL_CAPSULE | Freq: Every evening | ORAL | Status: DC | PRN

## 2013-08-28 MED ORDER — OMEPRAZOLE 20 MG PO CPDR: 20.0000 mg | DELAYED_RELEASE_CAPSULE | Freq: Two times a day (BID) | ORAL | Status: DC

## 2013-08-28 NOTE — Progress Notes (Signed)
Subjective:    Patient ID: Alexis Wallace, female    DOB: [birth year], 57 y.o.   MRN: [id number]  HPI Under stress with family member in ICU monitoring for bariatric surgery status Insurance billing program classes  Not taking the b12  ADD symptoms    Review of Systems  Constitutional: Negative for activity change, appetite change and fatigue.  HENT: Negative for ear pain, congestion, neck pain, postnasal drip and sinus pressure.   Eyes: Negative for redness and visual disturbance.  Respiratory: Negative for cough, shortness of breath and wheezing.   Gastrointestinal: Negative for abdominal pain and abdominal distention.  Genitourinary: Negative for dysuria, frequency and menstrual problem.  Musculoskeletal: Negative for myalgias, joint swelling and arthralgias.  Skin: Negative for rash and wound.  Neurological: Negative for dizziness, weakness and headaches.  Hematological: Negative for adenopathy. Does not bruise/bleed easily.  Psychiatric/Behavioral: Negative for sleep disturbance and decreased concentration.   Past Medical History  Diagnosis Date  . Osteoarthritis   . GERD (gastroesophageal reflux disease)   . Osteopenia   . Iron deficiency   . Anemia, iron deficiency   . B12 deficiency   . Allergy     History   Social History  . Marital Status: Married    Spouse Name: N/A    Number of Children: N/A  . Years of Education: N/A   Occupational History  . Not on file.   Social History Main Topics  . Smoking status: Former Smoker    Quit date: 02/10/1978  . Smokeless tobacco: Not on file  . Alcohol Use: 0.0 oz/week    0 Glasses of wine per week  . Drug Use: No  . Sexual Activity: Yes   Other Topics Concern  . Not on file   Social History Narrative  . No narrative on file    Past Surgical History  Procedure Laterality Date  . Gastric bypass  2001  . Cosmetic surgery      tummy tuck    Family History  Problem Relation Age of Onset  . Heart  disease Father   . Heart disease Sister     Hypertrophic cardiomyopathy    No Known Allergies  Current Outpatient Prescriptions on File Prior to Visit  Medication Sig Dispense Refill  . ALPRAZolam (XANAX) 0.25 MG tablet Take 1 tablet (0.25 mg total) by mouth 2 (two) times daily as needed for sleep.  60 tablet  3  . calcium carbonate 200 MG capsule Take 3,000 mg by mouth daily.      . clindamycin (CLINDAGEL) 1 % gel Apply topically 2 (two) times daily.  60 g  3  . estradiol (ESTRACE) 2 MG tablet Take 2 mg by mouth daily.        . Multiple Vitamin (MULTIVITAMIN) capsule Take 1 capsule by mouth daily.        Marland Kitchen omeprazole (PRILOSEC) 20 MG capsule Take 1 capsule (20 mg total) by mouth 2 (two) times daily.  60 capsule  11  . progesterone (PROMETRIUM) 200 MG capsule Take 200 mg by mouth daily. 12 days every 3 months       . temazepam (RESTORIL) 15 MG capsule Take 1 capsule (15 mg total) by mouth at bedtime as needed.  30 capsule  3   No current facility-administered medications on file prior to visit.    BP 130/80  Pulse 72  Temp(Src) 97.9 F (36.6 C)  Resp 16  Ht 5\' 8"  (1.727 m)  Wt 217 lb (98.431 kg)  BMI 33 kg/m2        Objective:   Physical Exam  Constitutional: She is oriented to person, place, and time. She appears well-developed and well-nourished. No distress.  HENT:  Head: Normocephalic and atraumatic.  Right Ear: External ear normal.  Left Ear: External ear normal.  Nose: Nose normal.  Mouth/Throat: Oropharynx is clear and moist.  Eyes: Conjunctivae and EOM are normal. Pupils are equal, round, and reactive to light.  Neck: Normal range of motion. Neck supple. No JVD present. No tracheal deviation present. No thyromegaly present.  Cardiovascular: Normal rate, regular rhythm, normal heart sounds and intact distal pulses.   No murmur heard. Pulmonary/Chest: Effort normal and breath sounds normal. She has no wheezes. She exhibits no tenderness.  Abdominal: Soft. Bowel  sounds are normal.  Musculoskeletal: Normal range of motion. She exhibits no edema and no tenderness.  Lymphadenopathy:    She has no cervical adenopathy.  Neurological: She is alert and oriented to person, place, and time. She has normal reflexes. No cranial nerve deficit.  Skin: Skin is warm and dry. She is not diaphoretic.  Psychiatric: She has a normal mood and affect. Her behavior is normal.          Assessment & Plan:  Needs to take the sublingual B12 Took the weight loss medications no change in weight ( the belviq) Add Discussion of adderal insomina- refill

## 2013-08-28 NOTE — Patient Instructions (Addendum)
Vit b12 dots 500 mg  Twice a day

## 2013-10-12 ENCOUNTER — Other Ambulatory Visit: Payer: Self-pay | Admitting: Internal Medicine

## 2014-01-05 ENCOUNTER — Encounter: Payer: Self-pay | Admitting: Internal Medicine

## 2014-01-05 ENCOUNTER — Ambulatory Visit (INDEPENDENT_AMBULATORY_CARE_PROVIDER_SITE_OTHER): Payer: BC Managed Care – PPO | Admitting: Internal Medicine

## 2014-01-05 VITALS — BP 120/80 | HR 64 | Temp 97.8°F | Ht 68.0 in | Wt 218.0 lb

## 2014-01-05 DIAGNOSIS — Z9884 Bariatric surgery status: Secondary | ICD-10-CM

## 2014-01-05 DIAGNOSIS — F988 Other specified behavioral and emotional disorders with onset usually occurring in childhood and adolescence: Secondary | ICD-10-CM

## 2014-01-05 MED ORDER — METHYLPHENIDATE HCL ER 20 MG PO TBCR: 20.0000 mg | EXTENDED_RELEASE_TABLET | Freq: Every day | ORAL | Status: DC

## 2014-01-05 MED ORDER — CLOTRIMAZOLE-BETAMETHASONE 1-0.05 % EX CREA: 1.0000 "application " | TOPICAL_CREAM | Freq: Two times a day (BID) | CUTANEOUS | Status: DC

## 2014-01-05 NOTE — Patient Instructions (Signed)
The patient is instructed to continue all medications as prescribed. Schedule followup with check out clerk upon leaving the clinic  

## 2014-01-05 NOTE — Progress Notes (Signed)
   Subjective:    Patient ID: Alexis Wallace, female    DOB: 20-Nov-1956, 58 y.o.   MRN: [id number]  HPI She is mild to moderate headaches with the 30 mg of the Ritalin extended release therefore we will modify her prescription to 20 mg.  This has influenced her use the medication for ADD and also to help control weight   Review of Systems  Constitutional: Negative for activity change, appetite change and fatigue.  HENT: Negative for congestion, ear pain, postnasal drip and sinus pressure.   Eyes: Negative for redness and visual disturbance.  Respiratory: Negative for cough, shortness of breath and wheezing.   Gastrointestinal: Negative for abdominal pain and abdominal distention.  Genitourinary: Negative for dysuria, frequency and menstrual problem.  Musculoskeletal: Negative for arthralgias, joint swelling, myalgias and neck pain.  Skin: Negative for rash and wound.  Neurological: Negative for dizziness, weakness and headaches.  Hematological: Negative for adenopathy. Does not bruise/bleed easily.  Psychiatric/Behavioral: Negative for sleep disturbance and decreased concentration.       Objective:   Physical Exam  Constitutional: She is oriented to person, place, and time. She appears well-developed and well-nourished. No distress.  HENT:  Head: Normocephalic and atraumatic.  Right Ear: External ear normal.  Left Ear: External ear normal.  Nose: Nose normal.  Mouth/Throat: Oropharynx is clear and moist.  Eyes: Conjunctivae and EOM are normal. Pupils are equal, round, and reactive to light.  Neck: Normal range of motion. Neck supple. No JVD present. No tracheal deviation present. No thyromegaly present.  Cardiovascular: Normal rate, regular rhythm, normal heart sounds and intact distal pulses.   No murmur heard. Pulmonary/Chest: Effort normal and breath sounds normal. She has no wheezes. She exhibits no tenderness.  Abdominal: Soft. Bowel sounds are normal.    Musculoskeletal: Normal range of motion. She exhibits no edema and no tenderness.  Lymphadenopathy:    She has no cervical adenopathy.  Neurological: She is alert and oriented to person, place, and time. She has normal reflexes. No cranial nerve deficit.  Skin: Skin is warm and dry. She is not diaphoretic.  Psychiatric: She has a normal mood and affect. Her behavior is normal.          Assessment & Plan:  Bariatric surgery status monitoring She will be due blood work after May Stable no symptoms of blood loss  On the prilosec and we discussed the rational

## 2014-01-05 NOTE — Progress Notes (Signed)
Pre visit review using our clinic review tool, if applicable. No additional management support is needed unless otherwise documented below in the visit note. 

## 2014-03-15 ENCOUNTER — Telehealth: Payer: Self-pay | Admitting: Internal Medicine

## 2014-03-15 MED ORDER — METHYLPHENIDATE HCL ER 20 MG PO TBCR: 20.0000 mg | EXTENDED_RELEASE_TABLET | Freq: Every day | ORAL | Status: DC

## 2014-03-15 NOTE — Telephone Encounter (Signed)
Pt states she did fine on the methylphenidate (METADATE ER) 20 MG ER tablet And now needs rx for this med

## 2014-03-19 ENCOUNTER — Telehealth: Payer: Self-pay | Admitting: Internal Medicine

## 2014-03-19 NOTE — Telephone Encounter (Signed)
Pt would to talk to Dr. Arnoldo Morale about her plan of care before he leaves.  She would either like and OV or phone call.  Please advise

## 2014-03-22 ENCOUNTER — Other Ambulatory Visit: Payer: Self-pay | Admitting: Internal Medicine

## 2014-04-19 ENCOUNTER — Other Ambulatory Visit: Payer: Self-pay | Admitting: Internal Medicine

## 2014-07-27 ENCOUNTER — Other Ambulatory Visit: Payer: BC Managed Care – PPO

## 2014-07-30 ENCOUNTER — Ambulatory Visit (INDEPENDENT_AMBULATORY_CARE_PROVIDER_SITE_OTHER): Payer: BC Managed Care – PPO

## 2014-07-30 ENCOUNTER — Encounter: Payer: Self-pay | Admitting: Podiatry

## 2014-07-30 ENCOUNTER — Ambulatory Visit (INDEPENDENT_AMBULATORY_CARE_PROVIDER_SITE_OTHER): Payer: BC Managed Care – PPO | Admitting: Podiatry

## 2014-07-30 VITALS — Ht 68.0 in | Wt 210.0 lb

## 2014-07-30 DIAGNOSIS — M722 Plantar fascial fibromatosis: Secondary | ICD-10-CM

## 2014-07-30 MED ORDER — TRIAMCINOLONE ACETONIDE 10 MG/ML IJ SUSP
10.0000 mg | Freq: Once | INTRAMUSCULAR | Status: AC
  Administered 2014-07-30: 10 mg

## 2014-07-30 NOTE — Progress Notes (Signed)
   Subjective:    Patient ID: Alexis Wallace, female    DOB: June 20, 1956, 58 y.o.   MRN: [id number]  HPI Comments: Patient presents with complaints of right heel pain. Patient states at the present for approximately 13 months which is increased over the last few months. She states that her pain is mostly in the morning and when going barefoot. Pain is localized to the plantar aspect of the right heel. Denies any history of trauma to the area. She previously has tried stretching exercises without any resolution of her symptoms. No other complaints at this time.     Review of Systems  All other systems reviewed and are negative.      Objective:   Physical Exam  Nursing note and vitals reviewed. Constitutional: She is oriented to person, place, and time. She appears well-developed and well-nourished.  Musculoskeletal: She exhibits no edema.  Tenderness over the plantar medial aspect of the right heel overlying the insertion of the plantar fascia. No pain along the course of plantar fascial pressure foot. Plantar fascia is intact. Mild equinus.  Neurological: She is alert and oriented to person, place, and time.  Protective sensation intact with Derrel Nip monofilament, vibratory sensation intact.  Skin: Skin is warm.  No open lesions.  DP/PT pulse palpable b/l. CRT < 3 sec. No edema.  No calf tenderness        Assessment & Plan:  58 year old female with a right foot plantar fasciitis. -X-rays were obtained at this appointment. See x-ray report for full details. No acute fracture. -Conservative vs. surgical intervention was discussed the patient in detail including alternatives, risks, complications.  -At this patient wished to proceed with a steroid injection to the right heel to help decrease inflammation. Under sterile skin preparation a total of 2.5 cc of Kenalog 10, 0.5% Marcaine plain, 2% lidocaine plain was infiltrated around the insertion of the plantar fascia at the  point of patient symptoms. Patient tolerated the injection well without any complications. -Dispensed plantar fascial brace. -Ice  -Stretching exercises discussed -Followup in one month or sooner if any problems are to arise.

## 2014-07-30 NOTE — Patient Instructions (Signed)
Plantar Fasciitis (Heel Spur Syndrome) with Rehab The plantar fascia is a fibrous, ligament-like, soft-tissue structure that spans the bottom of the foot. Plantar fasciitis is a condition that causes pain in the foot due to inflammation of the tissue. SYMPTOMS   Pain and tenderness on the underneath side of the foot.  Pain that worsens with standing or walking. CAUSES  Plantar fasciitis is caused by irritation and injury to the plantar fascia on the underneath side of the foot. Common mechanisms of injury include:  Direct trauma to bottom of the foot.  Damage to a small nerve that runs under the foot where the main fascia attaches to the heel bone.  Stress placed on the plantar fascia due to bone spurs. RISK INCREASES WITH:   Activities that place stress on the plantar fascia (running, jumping, pivoting, or cutting).  Poor strength and flexibility.  Improperly fitted shoes.  Tight calf muscles.  Flat feet.  Failure to warm-up properly before activity.  Obesity. PREVENTION  Warm up and stretch properly before activity.  Allow for adequate recovery between workouts.  Maintain physical fitness:  Strength, flexibility, and endurance.  Cardiovascular fitness.  Maintain a health body weight.  Avoid stress on the plantar fascia.  Wear properly fitted shoes, including arch supports for individuals who have flat feet. PROGNOSIS  If treated properly, then the symptoms of plantar fasciitis usually resolve without surgery. However, occasionally surgery is necessary. RELATED COMPLICATIONS   Recurrent symptoms that may result in a chronic condition.  Problems of the lower back that are caused by compensating for the injury, such as limping.  Pain or weakness of the foot during push-off following surgery.  Chronic inflammation, scarring, and partial or complete fascia tear, occurring more often from repeated injections. TREATMENT  Treatment initially involves the use of  ice and medication to help reduce pain and inflammation. The use of strengthening and stretching exercises may help reduce pain with activity, especially stretches of the Achilles tendon. These exercises may be performed at home or with a therapist. Your caregiver may recommend that you use heel cups of arch supports to help reduce stress on the plantar fascia. Occasionally, corticosteroid injections are given to reduce inflammation. If symptoms persist for greater than 6 months despite non-surgical (conservative), then surgery may be recommended.  MEDICATION   If pain medication is necessary, then nonsteroidal anti-inflammatory medications, such as aspirin and ibuprofen, or other minor pain relievers, such as acetaminophen, are often recommended.  Do not take pain medication within 7 days before surgery.  Prescription pain relievers may be given if deemed necessary by your caregiver. Use only as directed and only as much as you need.  Corticosteroid injections may be given by your caregiver. These injections should be reserved for the most serious cases, because they may only be given a certain number of times. HEAT AND COLD  Cold treatment (icing) relieves pain and reduces inflammation. Cold treatment should be applied for 10 to 15 minutes every 2 to 3 hours for inflammation and pain and immediately after any activity that aggravates your symptoms. Use ice packs or massage the area with a piece of ice (ice massage).  Heat treatment may be used prior to performing the stretching and strengthening activities prescribed by your caregiver, physical therapist, or athletic trainer. Use a heat pack or soak the injury in warm water. SEEK IMMEDIATE MEDICAL CARE IF:  Treatment seems to offer no benefit, or the condition worsens.  Any medications produce adverse side effects. EXERCISES RANGE   OF MOTION (ROM) AND STRETCHING EXERCISES - Plantar Fasciitis (Heel Spur Syndrome) These exercises may help you  when beginning to rehabilitate your injury. Your symptoms may resolve with or without further involvement from your physician, physical therapist or athletic trainer. While completing these exercises, remember:   Restoring tissue flexibility helps normal motion to return to the joints. This allows healthier, less painful movement and activity.  An effective stretch should be held for at least 30 seconds.  A stretch should never be painful. You should only feel a gentle lengthening or release in the stretched tissue. RANGE OF MOTION - Toe Extension, Flexion  Sit with your right / left leg crossed over your opposite knee.  Grasp your toes and gently pull them back toward the top of your foot. You should feel a stretch on the bottom of your toes and/or foot.  Hold this stretch for __________ seconds.  Now, gently pull your toes toward the bottom of your foot. You should feel a stretch on the top of your toes and or foot.  Hold this stretch for __________ seconds. Repeat __________ times. Complete this stretch __________ times per day.  RANGE OF MOTION - Ankle Dorsiflexion, Active Assisted  Remove shoes and sit on a chair that is preferably not on a carpeted surface.  Place right / left foot under knee. Extend your opposite leg for support.  Keeping your heel down, slide your right / left foot back toward the chair until you feel a stretch at your ankle or calf. If you do not feel a stretch, slide your bottom forward to the edge of the chair, while still keeping your heel down.  Hold this stretch for __________ seconds. Repeat __________ times. Complete this stretch __________ times per day.  STRETCH - Gastroc, Standing  Place hands on wall.  Extend right / left leg, keeping the front knee somewhat bent.  Slightly point your toes inward on your back foot.  Keeping your right / left heel on the floor and your knee straight, shift your weight toward the wall, not allowing your back to  arch.  You should feel a gentle stretch in the right / left calf. Hold this position for __________ seconds. Repeat __________ times. Complete this stretch __________ times per day. STRETCH - Soleus, Standing  Place hands on wall.  Extend right / left leg, keeping the other knee somewhat bent.  Slightly point your toes inward on your back foot.  Keep your right / left heel on the floor, bend your back knee, and slightly shift your weight over the back leg so that you feel a gentle stretch deep in your back calf.  Hold this position for __________ seconds. Repeat __________ times. Complete this stretch __________ times per day. STRETCH - Gastrocsoleus, Standing  Note: This exercise can place a lot of stress on your foot and ankle. Please complete this exercise only if specifically instructed by your caregiver.   Place the ball of your right / left foot on a step, keeping your other foot firmly on the same step.  Hold on to the wall or a rail for balance.  Slowly lift your other foot, allowing your body weight to press your heel down over the edge of the step.  You should feel a stretch in your right / left calf.  Hold this position for __________ seconds.  Repeat this exercise with a slight bend in your right / left knee. Repeat __________ times. Complete this stretch __________ times per day.    STRENGTHENING EXERCISES - Plantar Fasciitis (Heel Spur Syndrome)  These exercises may help you when beginning to rehabilitate your injury. They may resolve your symptoms with or without further involvement from your physician, physical therapist or athletic trainer. While completing these exercises, remember:   Muscles can gain both the endurance and the strength needed for everyday activities through controlled exercises.  Complete these exercises as instructed by your physician, physical therapist or athletic trainer. Progress the resistance and repetitions only as guided. STRENGTH -  Towel Curls  Sit in a chair positioned on a non-carpeted surface.  Place your foot on a towel, keeping your heel on the floor.  Pull the towel toward your heel by only curling your toes. Keep your heel on the floor.  If instructed by your physician, physical therapist or athletic trainer, add ____________________ at the end of the towel. Repeat __________ times. Complete this exercise __________ times per day. STRENGTH - Ankle Inversion  Secure one end of a rubber exercise band/tubing to a fixed object (table, pole). Loop the other end around your foot just before your toes.  Place your fists between your knees. This will focus your strengthening at your ankle.  Slowly, pull your big toe up and in, making sure the band/tubing is positioned to resist the entire motion.  Hold this position for __________ seconds.  Have your muscles resist the band/tubing as it slowly pulls your foot back to the starting position. Repeat __________ times. Complete this exercises __________ times per day.  Document Released: 11/23/2005 Document Revised: 02/15/2012 Document Reviewed: 03/07/2009 [employer] Patient Information 2015 [employer]. This information is not intended to replace advice given to you by your health care provider. Make sure you discuss any questions you have with your health care provider.  

## 2014-08-03 ENCOUNTER — Encounter: Payer: BC Managed Care – PPO | Admitting: Internal Medicine

## 2014-08-29 ENCOUNTER — Ambulatory Visit: Payer: BC Managed Care – PPO | Admitting: Podiatry

## 2014-08-29 ENCOUNTER — Encounter: Payer: Self-pay | Admitting: Podiatry

## 2014-08-29 ENCOUNTER — Ambulatory Visit (INDEPENDENT_AMBULATORY_CARE_PROVIDER_SITE_OTHER): Payer: BC Managed Care – PPO | Admitting: Podiatry

## 2014-08-29 VITALS — BP 129/62 | HR 68 | Resp 16

## 2014-08-29 DIAGNOSIS — M7671 Peroneal tendinitis, right leg: Secondary | ICD-10-CM

## 2014-08-29 DIAGNOSIS — M7750 Other enthesopathy of unspecified foot: Secondary | ICD-10-CM

## 2014-08-29 DIAGNOSIS — M722 Plantar fascial fibromatosis: Secondary | ICD-10-CM

## 2014-08-29 MED ORDER — TRIAMCINOLONE ACETONIDE 10 MG/ML IJ SUSP: 10.0000 mg | Freq: Once | INTRAMUSCULAR | Status: DC

## 2014-08-29 NOTE — Progress Notes (Signed)
Patient ID: Alexis Wallace, female   DOB: [birth year], 58 y.o.   MRN: [id number]  Subjective: Ms. Alexis Wallace returns the office today for followup evaluation of right foot plantar fasciitis. She states that today her pain is about 95% better. She does continue to get some discomfort into the bottom of her heel after periods of standing or activity, but has greatly decreased from last appointment. She has been continuing with the plantar fascial brace, stretching, icing She states that she has started to develop a little bit of tenderness over the outside aspect of her foot which is intermittent and currently does not cause any pain. She has not been icing this area. She denies any acute changes since last appointment and denies any history, or injury. She presents today wearing a flat shoe. No other complaints at this time.  Objective: AAO x3, no acute distress DP/PT pulses palpable bilaterally, CRT less than 3 seconds Protective sensation intact with Simms Weinstein monofilament, vibratory sensation intact, Achilles tendon reflex intact. Mild tenderness to the plantar medial tubercle of the calcaneus at the insertion the plantar fascia. No pain with lateral compression of the calcaneus or along the posterior aspect. No pain within the arch of the foot along the course of plantar fascia. Mild tenderness along the inferior course of the peroneal tendons inferior to the fibula. No pain with active or passive range of motion. No edema. MMT 5/5 No open skin lesions. No calf pain, swelling, warmth.  Assessment: 58 year old female with resolving right foot plantar fasciitis, possible peroneal tendinitis.  Plan: -Conservative versus surgical treatment were discussed including alternatives, risks, complications. -Patient elects to proceed with a second steroid injection into the right heel. Under sterile skin preparation, a total of 2.5cc of kenalog 10, 0.5% Marcaine plain, and 2% lidocaine plain were  infiltrated into the symptomatic area without complication. A band-aid was applied. Patient tolerated the injection well without complication.  -Continue of plantar fascial brace -Continue icing of the effected area the plantar fascial in the peroneal tendonitis -Again discussed the importance of supportive shoe gear and possible orthotics. -Followup in 3 weeks or sooner if any palms are to arise or any changes symptoms. In the meantime call any questions, concerns.

## 2014-08-29 NOTE — Patient Instructions (Signed)
Plantar Fasciitis (Heel Spur Syndrome) with Rehab The plantar fascia is a fibrous, ligament-like, soft-tissue structure that spans the bottom of the foot. Plantar fasciitis is a condition that causes pain in the foot due to inflammation of the tissue. SYMPTOMS   Pain and tenderness on the underneath side of the foot.  Pain that worsens with standing or walking. CAUSES  Plantar fasciitis is caused by irritation and injury to the plantar fascia on the underneath side of the foot. Common mechanisms of injury include:  Direct trauma to bottom of the foot.  Damage to a small nerve that runs under the foot where the main fascia attaches to the heel bone.  Stress placed on the plantar fascia due to bone spurs. RISK INCREASES WITH:   Activities that place stress on the plantar fascia (running, jumping, pivoting, or cutting).  Poor strength and flexibility.  Improperly fitted shoes.  Tight calf muscles.  Flat feet.  Failure to warm-up properly before activity.  Obesity. PREVENTION  Warm up and stretch properly before activity.  Allow for adequate recovery between workouts.  Maintain physical fitness:  Strength, flexibility, and endurance.  Cardiovascular fitness.  Maintain a health body weight.  Avoid stress on the plantar fascia.  Wear properly fitted shoes, including arch supports for individuals who have flat feet. PROGNOSIS  If treated properly, then the symptoms of plantar fasciitis usually resolve without surgery. However, occasionally surgery is necessary. RELATED COMPLICATIONS   Recurrent symptoms that may result in a chronic condition.  Problems of the lower back that are caused by compensating for the injury, such as limping.  Pain or weakness of the foot during push-off following surgery.  Chronic inflammation, scarring, and partial or complete fascia tear, occurring more often from repeated injections. TREATMENT  Treatment initially involves the use of  ice and medication to help reduce pain and inflammation. The use of strengthening and stretching exercises may help reduce pain with activity, especially stretches of the Achilles tendon. These exercises may be performed at home or with a therapist. Your caregiver may recommend that you use heel cups of arch supports to help reduce stress on the plantar fascia. Occasionally, corticosteroid injections are given to reduce inflammation. If symptoms persist for greater than 6 months despite non-surgical (conservative), then surgery may be recommended.  MEDICATION   If pain medication is necessary, then nonsteroidal anti-inflammatory medications, such as aspirin and ibuprofen, or other minor pain relievers, such as acetaminophen, are often recommended.  Do not take pain medication within 7 days before surgery.  Prescription pain relievers may be given if deemed necessary by your caregiver. Use only as directed and only as much as you need.  Corticosteroid injections may be given by your caregiver. These injections should be reserved for the most serious cases, because they may only be given a certain number of times. HEAT AND COLD  Cold treatment (icing) relieves pain and reduces inflammation. Cold treatment should be applied for 10 to 15 minutes every 2 to 3 hours for inflammation and pain and immediately after any activity that aggravates your symptoms. Use ice packs or massage the area with a piece of ice (ice massage).  Heat treatment may be used prior to performing the stretching and strengthening activities prescribed by your caregiver, physical therapist, or athletic trainer. Use a heat pack or soak the injury in warm water. SEEK IMMEDIATE MEDICAL CARE IF:  Treatment seems to offer no benefit, or the condition worsens.  Any medications produce adverse side effects. EXERCISES RANGE   OF MOTION (ROM) AND STRETCHING EXERCISES - Plantar Fasciitis (Heel Spur Syndrome) These exercises may help you  when beginning to rehabilitate your injury. Your symptoms may resolve with or without further involvement from your physician, physical therapist or athletic trainer. While completing these exercises, remember:   Restoring tissue flexibility helps normal motion to return to the joints. This allows healthier, less painful movement and activity.  An effective stretch should be held for at least 30 seconds.  A stretch should never be painful. You should only feel a gentle lengthening or release in the stretched tissue. RANGE OF MOTION - Toe Extension, Flexion  Sit with your right / left leg crossed over your opposite knee.  Grasp your toes and gently pull them back toward the top of your foot. You should feel a stretch on the bottom of your toes and/or foot.  Hold this stretch for __________ seconds.  Now, gently pull your toes toward the bottom of your foot. You should feel a stretch on the top of your toes and or foot.  Hold this stretch for __________ seconds. Repeat __________ times. Complete this stretch __________ times per day.  RANGE OF MOTION - Ankle Dorsiflexion, Active Assisted  Remove shoes and sit on a chair that is preferably not on a carpeted surface.  Place right / left foot under knee. Extend your opposite leg for support.  Keeping your heel down, slide your right / left foot back toward the chair until you feel a stretch at your ankle or calf. If you do not feel a stretch, slide your bottom forward to the edge of the chair, while still keeping your heel down.  Hold this stretch for __________ seconds. Repeat __________ times. Complete this stretch __________ times per day.  STRETCH - Gastroc, Standing  Place hands on wall.  Extend right / left leg, keeping the front knee somewhat bent.  Slightly point your toes inward on your back foot.  Keeping your right / left heel on the floor and your knee straight, shift your weight toward the wall, not allowing your back to  arch.  You should feel a gentle stretch in the right / left calf. Hold this position for __________ seconds. Repeat __________ times. Complete this stretch __________ times per day. STRETCH - Soleus, Standing  Place hands on wall.  Extend right / left leg, keeping the other knee somewhat bent.  Slightly point your toes inward on your back foot.  Keep your right / left heel on the floor, bend your back knee, and slightly shift your weight over the back leg so that you feel a gentle stretch deep in your back calf.  Hold this position for __________ seconds. Repeat __________ times. Complete this stretch __________ times per day. STRETCH - Gastrocsoleus, Standing  Note: This exercise can place a lot of stress on your foot and ankle. Please complete this exercise only if specifically instructed by your caregiver.   Place the ball of your right / left foot on a step, keeping your other foot firmly on the same step.  Hold on to the wall or a rail for balance.  Slowly lift your other foot, allowing your body weight to press your heel down over the edge of the step.  You should feel a stretch in your right / left calf.  Hold this position for __________ seconds.  Repeat this exercise with a slight bend in your right / left knee. Repeat __________ times. Complete this stretch __________ times per day.    STRENGTHENING EXERCISES - Plantar Fasciitis (Heel Spur Syndrome)  These exercises may help you when beginning to rehabilitate your injury. They may resolve your symptoms with or without further involvement from your physician, physical therapist or athletic trainer. While completing these exercises, remember:   Muscles can gain both the endurance and the strength needed for everyday activities through controlled exercises.  Complete these exercises as instructed by your physician, physical therapist or athletic trainer. Progress the resistance and repetitions only as guided. STRENGTH -  Towel Curls  Sit in a chair positioned on a non-carpeted surface.  Place your foot on a towel, keeping your heel on the floor.  Pull the towel toward your heel by only curling your toes. Keep your heel on the floor.  If instructed by your physician, physical therapist or athletic trainer, add ____________________ at the end of the towel. Repeat __________ times. Complete this exercise __________ times per day. STRENGTH - Ankle Inversion  Secure one end of a rubber exercise band/tubing to a fixed object (table, pole). Loop the other end around your foot just before your toes.  Place your fists between your knees. This will focus your strengthening at your ankle.  Slowly, pull your big toe up and in, making sure the band/tubing is positioned to resist the entire motion.  Hold this position for __________ seconds.  Have your muscles resist the band/tubing as it slowly pulls your foot back to the starting position. Repeat __________ times. Complete this exercises __________ times per day.  Document Released: 11/23/2005 Document Revised: 02/15/2012 Document Reviewed: 03/07/2009 [employer] Patient Information 2015 [employer]. This information is not intended to replace advice given to you by your health care provider. Make sure you discuss any questions you have with your health care provider.  

## 2014-08-30 ENCOUNTER — Ambulatory Visit (INDEPENDENT_AMBULATORY_CARE_PROVIDER_SITE_OTHER): Payer: BC Managed Care – PPO | Admitting: Family Medicine

## 2014-08-30 ENCOUNTER — Encounter: Payer: Self-pay | Admitting: Family Medicine

## 2014-08-30 VITALS — BP 120/78 | Temp 98.3°F | Ht 68.0 in | Wt 228.0 lb

## 2014-08-30 DIAGNOSIS — R7989 Other specified abnormal findings of blood chemistry: Secondary | ICD-10-CM

## 2014-08-30 DIAGNOSIS — D509 Iron deficiency anemia, unspecified: Secondary | ICD-10-CM

## 2014-08-30 DIAGNOSIS — IMO0001 Reserved for inherently not codable concepts without codable children: Secondary | ICD-10-CM

## 2014-08-30 DIAGNOSIS — Z23 Encounter for immunization: Secondary | ICD-10-CM

## 2014-08-30 DIAGNOSIS — N318 Other neuromuscular dysfunction of bladder: Secondary | ICD-10-CM

## 2014-08-30 DIAGNOSIS — D518 Other vitamin B12 deficiency anemias: Secondary | ICD-10-CM

## 2014-08-30 DIAGNOSIS — G4700 Insomnia, unspecified: Secondary | ICD-10-CM | POA: Insufficient documentation

## 2014-08-30 DIAGNOSIS — L719 Rosacea, unspecified: Secondary | ICD-10-CM | POA: Insufficient documentation

## 2014-08-30 DIAGNOSIS — Z9884 Bariatric surgery status: Secondary | ICD-10-CM

## 2014-08-30 DIAGNOSIS — Z0000 Encounter for general adult medical examination without abnormal findings: Secondary | ICD-10-CM

## 2014-08-30 LAB — LIPID PANEL
CHOL/HDL RATIO: 3
CHOLESTEROL: 191 mg/dL (ref 0–200)
HDL: 71.2 mg/dL (ref >39.00)
LDL Cholesterol: 104 mg/dL — ABNORMAL HIGH (ref 0–99)
NonHDL: 119.8
TRIGLYCERIDES: 78 mg/dL (ref 0.0–149.0)
VLDL: 15.6 mg/dL (ref 0.0–40.0)

## 2014-08-30 LAB — VITAMIN D 25 HYDROXY (VIT D DEFICIENCY, FRACTURES): VITD: 30.65 ng/mL (ref 30.00–100.00)

## 2014-08-30 LAB — CBC
HCT: 38.1 % (ref 36.0–46.0)
HEMOGLOBIN: 12.7 g/dL (ref 12.0–15.0)
MCHC: 33.4 g/dL (ref 30.0–36.0)
MCV: 93.6 fl (ref 78.0–100.0)
PLATELETS: 203 10*3/uL (ref 150.0–400.0)
RBC: 4.07 Mil/uL (ref 3.87–5.11)
RDW: 12.5 % (ref 11.5–15.5)
WBC: 6.3 10*3/uL (ref 4.0–10.5)

## 2014-08-30 LAB — COMPREHENSIVE METABOLIC PANEL
ALK PHOS: 88 U/L (ref 39–117)
ALT: 22 U/L (ref 0–35)
AST: 22 U/L (ref 0–37)
Albumin: 3.7 g/dL (ref 3.5–5.2)
BUN: 12 mg/dL (ref 6–23)
CO2: 30 mEq/L (ref 19–32)
CREATININE: 0.7 mg/dL (ref 0.4–1.2)
Calcium: 9 mg/dL (ref 8.4–10.5)
Chloride: 104 mEq/L (ref 96–112)
GFR: 98.6 mL/min (ref >60.00)
GLUCOSE: 82 mg/dL (ref 70–99)
Potassium: 4.6 mEq/L (ref 3.5–5.1)
Sodium: 140 mEq/L (ref 135–145)
Total Bilirubin: 0.5 mg/dL (ref 0.2–1.2)
Total Protein: 6.7 g/dL (ref 6.0–8.3)

## 2014-08-30 LAB — FERRITIN: Ferritin: 133.6 ng/mL (ref 10.0–291.0)

## 2014-08-30 LAB — TSH: TSH: 2.45 u[IU]/mL (ref 0.35–4.50)

## 2014-08-30 LAB — VITAMIN B12: Vitamin B-12: >1500 pg/mL — ABNORMAL HIGH (ref 211–911)

## 2014-08-30 MED ORDER — OMEPRAZOLE 20 MG PO CPDR: 20.0000 mg | DELAYED_RELEASE_CAPSULE | Freq: Two times a day (BID) | ORAL | Status: DC

## 2014-08-30 MED ORDER — TEMAZEPAM 15 MG PO CAPS: ORAL_CAPSULE | ORAL | Status: DC

## 2014-08-30 NOTE — Patient Instructions (Signed)
58 y.o. female presenting for annual physical.  Health Maintenance counseling: 1. Anticipatory guidance: Patient counseled regarding regular dental exams, wearing seatbelts.  2. Risk factor reduction:  Advised patient of need for regular exercise and diet rich and fruits and vegetables to reduce risk of heart attack and stroke. Advised 150 minutes a week of exercise. Already doing 2 sessions a week with Yoga so advised add 3 days of exercise. Patient set goal to work on snacking and cut out some of her favorite foods mainly cookies/sweets.  3. Immunizations/screenings/ancillary studies Health Maintenance Due  Topic Date Due  . Colonoscopy - due in 1 year 01/02/2004  . Pap Smear - 10/2013 10/04/2012  . Tetanus/tdap -next visit 06/06/2013  . Mammogram -10/2013 09/27/2013  . Zostavax -ask again in 5 years 01/01/2014  . Influenza Vaccine - today 07/07/2014  4. Will ask for records to be sent for pap smear and mammogram

## 2014-08-30 NOTE — Progress Notes (Signed)
Alexis Reddish, MD Phone: [phone]  Subjective:  Patient presents today to establish care with me as their new primary care provider as well as for CPE. Patient was formerly a patient of Dr. Arnoldo Morale. Chief complaint-noted.   Weight gain-retired 1 year ago and has become less active. Recently started yoga twice a week. Goal 5 days  Dr. Nori Riis- ob/gyn Pap smear 10/2014. Mammogram 10/2014.  Plantar fasciitis just got injection yesterday right foot.  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 Raising granddaughter after daughter has TBI.   ROS-fatigued (but going to school, raising 60 year old), has had slight vaginal bleeding-advised patient to seek care with ob/gyn asap and already has appointment, otherwise negative  The following were reviewed and entered/updated in epic: Past Medical History  Diagnosis Date  . Osteoarthritis   . GERD (gastroesophageal reflux disease)   . Osteopenia   . Iron deficiency   . Anemia, iron deficiency   . B12 deficiency   . Allergy    Patient Active Problem List   Diagnosis Date Noted  . Rosacea 08/30/2014    Priority: Medium  . Bariatric surgery status 09/01/2010    Priority: Medium  . 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 Family history of hypertrophic cardiomyopathy 05/13/2011    Priority: Low  . Actinic keratosis 10/22/2010    Priority: Low  . 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 ALLERGIC RHINITIS 03/01/2008    Priority: Low  . ANEMIA-IRON DEFICIENCY 11/21/2007    Priority: Low  . ANEMIA, B12 DEFICIENCY 08/15/2007    Priority: Low  . GERD 06/02/2007    Priority: Low  . PSORIASIS 06/02/2007    Priority: Low  . OSTEOARTHRITIS 06/02/2007    Priority: Low  . OSTEOPENIA 06/02/2007    Priority: Low   Past Surgical History  Procedure Laterality Date  . Gastric bypass  2001  . Cosmetic surgery      tummy tuck    . Carpal tunnel right      Family History  Problem Relation Age of Onset  . Heart disease Father   . Heart disease Sister     Hypertrophic cardiomyopathy  . Lung cancer Mother     stage IV, smoker    Medications- reviewed and updated Current Outpatient Prescriptions  Medication Sig Dispense Refill  . amitriptyline (ELAVIL) 10 MG tablet Take 10 mg by mouth at bedtime.      . calcium carbonate 200 MG capsule Take 3,000 mg by mouth daily.      Marland Kitchen estradiol (ESTRACE) 2 MG tablet Take 2 mg by mouth daily.        . hydrOXYzine (ATARAX/VISTARIL) 10 MG tablet Take 10 mg by mouth daily.      . minocycline (DYNACIN) 50 MG tablet Take 50 mg by mouth 2 (two) times daily.      . Multiple Vitamin (MULTIVITAMIN) capsule Take 1 capsule by mouth daily.        Marland Kitchen omeprazole (PRILOSEC) 20 MG capsule Take 1 capsule (20 mg total) by mouth 2 (two) times daily.  60 capsule  11  . progesterone (PROMETRIUM) 200 MG capsule Take 200 mg by mouth daily. 12 days every 3 months       . temazepam (RESTORIL) 15 MG capsule TAKE ONE CAPSULE AT BEDTIME  30 capsule  5   Current Facility-Administered Medications  Medication Dose Route  Frequency Provider Last Rate Last Dose  . triamcinolone acetonide (KENALOG) 10 MG/ML injection 10 mg  10 mg Other Once Celesta Gentile, DPM        Allergies-reviewed and updated No Known Allergies  History   Social History  . Marital Status: Married    Spouse Name: N/A    Number of Children: N/A  . Years of Education: N/A   Social History Main Topics  . Smoking status: Former Smoker -- 1.00 packs/day for 5 years    Types: Cigarettes    Quit date: 02/10/1978  . Smokeless tobacco: Not on file  . Alcohol Use: 0.0 oz/week    0 Glasses of wine per week     Comment: rare  . Drug Use: No  . Sexual Activity: Yes   Other Topics Concern  . Not on file   Social History Narrative   Married 1976. 2 kids. Jenny Reichmann (his wife had TBI being struck by car) with daughter [name]. Patient  takes care of [name] and son lives with her and husband) and Colletta Maryland lives in [place] with no kids.       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 Raising granddaughter after daughter has TBI.       Hobbies: spending time with friends and family             ROS--See HPI   Objective: BP 120/78  Temp(Src) 98.3 F (36.8 C)  Ht 5\' 8"  (1.727 m)  Wt 228 lb (103.42 kg)  BMI 34.68 kg/m2 Gen: NAD, resting comfortably in chair, obese HEENT: Mucous membranes are moist. Oropharynx normal. Caps on all teeth.  Neck: no thyromegaly CV: RRR no murmurs rubs or gallops Lungs: CTAB no crackles, wheeze, rhonchi Abdomen: soft/nontender/nondistended/normal bowel sounds.  Ext: no edema, 2+ radial pulses Skin: warm, dry, no rash-no active psoriasis or rosacea noted Neuro: grossly normal, moves all extremities, PERRLA  Assessment/Plan:  58 y.o. female presenting for annual physical.  Health Maintenance counseling: 1. Anticipatory guidance: Patient counseled regarding regular dental exams, wearing seatbelts.  2. Risk factor reduction:  Advised patient of need for regular exercise and diet rich and fruits and vegetables to reduce risk of heart attack and stroke. Advised 150 minutes a week of exercise. Already doing 2 sessions a week with Yoga so advised add 3 days of exercise. Patient set goal to work on snacking and cut out some of her favorite foods mainly cookies/sweets.  3. Immunizations/screenings/ancillary studies Health Maintenance Due  Topic Date Due  . Colonoscopy - due in 1 year 01/02/2004  . Pap Smear - 10/2013 10/04/2012  . Tetanus/tdap -next visit 06/06/2013  . Mammogram -10/2013 09/27/2013  . Zostavax -ask again in 5 years 01/01/2014  . Influenza Vaccine - today 07/07/2014  4. Will ask for records to be sent for pap smear and mammogram  No problem-specific assessment & plan notes found for this  encounter.  Fasting Orders Placed This Encounter  Procedures  . CBC    [place]  . Comprehensive metabolic panel    [place]  . Lipid panel    [place]  . TSH    [place]  . Vitamin B12  . Vit D  25 hydroxy (rtn osteoporosis monitoring)      . Iron and TIBC  . Ferritin    Meds ordered this encounter  Medications  . omeprazole (PRILOSEC) 20 MG capsule    Sig: Take 1 capsule (20 mg  total) by mouth 2 (two) times daily.    Dispense:  60 capsule    Refill:  11  . temazepam (RESTORIL) 15 MG capsule    Sig: TAKE ONE CAPSULE AT BEDTIME    Dispense:  30 capsule    Refill:  5

## 2014-08-31 LAB — IRON AND TIBC
%SAT: 39 % (ref 20–55)
Iron: 111 ug/dL (ref 42–145)
TIBC: 283 ug/dL (ref 250–470)
UIBC: 172 ug/dL (ref 125–400)

## 2014-09-19 ENCOUNTER — Ambulatory Visit: Payer: BC Managed Care – PPO | Admitting: Podiatry

## 2014-09-21 ENCOUNTER — Encounter: Payer: Self-pay | Admitting: Podiatry

## 2014-09-21 ENCOUNTER — Ambulatory Visit (INDEPENDENT_AMBULATORY_CARE_PROVIDER_SITE_OTHER): Payer: BC Managed Care – PPO | Admitting: Podiatry

## 2014-09-21 VITALS — BP 128/67 | HR 70 | Resp 16

## 2014-09-21 DIAGNOSIS — M722 Plantar fascial fibromatosis: Secondary | ICD-10-CM

## 2014-09-21 NOTE — Progress Notes (Signed)
Patient ID: Alexis Wallace, female   DOB: 1956-08-09, 58 y.o.   MRN: [id number]  Subjective: Alexis Wallace returns after for followup evaluation of right foot plantar fasciitis. She states that she is better than what she was she first presented.She does state that she continues have some discomfort into the plantar medial aspect of her heel, mostly in the morning. Also states that she has some pain after periods of weightbearing. She's been continuing stretching icing exercises. She's been wearing the plantar fascial brace intermittently. Patient presents in a flat shoe she is going to yoga. Denies any acute changes since last appointment. No injury or trauma to the area.   Objective: AAO x3, NAD DP/PT pulses palpable b/l. CRT < 3 sec  Protective sensation intact with Simms Weinstein monofilament. Mild tenderness to palpation of the right plantar medial tubercle of the right calcaneus. No pain along the course of plantar fashion the arch of the foot. No pain with lateral compression of the calcaneus or with vibratory sensation. No pain along the posterior aspect. No overlying edema or increased warmth. No erythema. MMT 5/5, ROM WNL No calf pain, swelling, warmth. No open lesions.  Assessment: 58 year old female with right foot plantar fasciitis.  Plan: -Treatment options discussed including alternatives, risks, complications. -At this time patient wishes to hold off on another steroid injection. She states this [place] in January with her kids and she wants to hold off on any further injections until later in the year. -Discussed orthotics. At this time the patient was scanned for orthotics and sent to [facility] labs.  -Continue stretching exercises. -Continue icing the effected area. -Continue to wear plantar fascial brace. -Discussed the importance of supportive shoe gear. -Followup at the orthotics are made. In the meantime call the office with any questions, concerns, change in  symptoms.

## 2014-10-12 ENCOUNTER — Other Ambulatory Visit: Payer: BC Managed Care – PPO

## 2014-10-12 ENCOUNTER — Ambulatory Visit: Payer: BC Managed Care – PPO

## 2014-10-12 DIAGNOSIS — M722 Plantar fascial fibromatosis: Secondary | ICD-10-CM

## 2014-10-12 NOTE — Progress Notes (Signed)
Pt is here to PUO 

## 2014-10-12 NOTE — Patient Instructions (Signed)

## 2014-10-25 ENCOUNTER — Other Ambulatory Visit: Payer: Self-pay | Admitting: Obstetrics & Gynecology

## 2014-10-30 LAB — CYTOLOGY - PAP

## 2014-11-12 ENCOUNTER — Telehealth: Payer: Self-pay | Admitting: Family Medicine

## 2014-11-12 NOTE — Telephone Encounter (Signed)
That's fine

## 2014-11-12 NOTE — Telephone Encounter (Signed)
Pt is scheduled for fri 11/16/14 for Urgent care fup. She said her cough is getting worse and is asking if she can come in earlier. Can we work her in.

## 2014-11-13 NOTE — Telephone Encounter (Signed)
S/w pt she will wait till Friday .Marland Kitchen

## 2014-11-16 ENCOUNTER — Telehealth: Payer: Self-pay | Admitting: Family Medicine

## 2014-11-16 ENCOUNTER — Encounter: Payer: Self-pay | Admitting: Family Medicine

## 2014-11-16 ENCOUNTER — Ambulatory Visit (INDEPENDENT_AMBULATORY_CARE_PROVIDER_SITE_OTHER): Payer: BC Managed Care – PPO | Admitting: Family Medicine

## 2014-11-16 VITALS — BP 122/70 | HR 66 | Temp 97.7°F | Wt 227.0 lb

## 2014-11-16 DIAGNOSIS — J189 Pneumonia, unspecified organism: Secondary | ICD-10-CM

## 2014-11-16 MED ORDER — AMOXICILLIN-POT CLAVULANATE 875-125 MG PO TABS: 1.0000 | ORAL_TABLET | Freq: Two times a day (BID) | ORAL | Status: DC

## 2014-11-16 MED ORDER — AZITHROMYCIN 250 MG PO TABS: ORAL_TABLET | ORAL | Status: DC

## 2014-11-16 MED ORDER — ALBUTEROL SULFATE 108 (90 BASE) MCG/ACT IN AEPB: 2.0000 | INHALATION_SPRAY | Freq: Four times a day (QID) | RESPIRATORY_TRACT | Status: DC | PRN

## 2014-11-16 NOTE — Telephone Encounter (Signed)
Called and informed pt of Dr. Yong Channel recommedation

## 2014-11-16 NOTE — Patient Instructions (Addendum)
Take both antibiotics Follow up Tuesday-Thursday of next week to ensure improvement If symptoms worsen especially the shortness of breath or new ones appear, you need to seek care  Pneumonia Pneumonia is an infection of the lungs.  CAUSES Pneumonia may be caused by bacteria or a virus. Usually, these infections are caused by breathing infectious particles into the lungs (respiratory tract). SIGNS AND SYMPTOMS   Cough.  Fever.  Chest pain.  Increased rate of breathing.  Wheezing.  Mucus production. DIAGNOSIS  If you have the common symptoms of pneumonia, your health care provider will typically confirm the diagnosis with a chest X-ray. The X-ray will show an abnormality in the lung (pulmonary infiltrate) if you have pneumonia. Other tests of your blood, urine, or sputum may be done to find the specific cause of your pneumonia. Your health care provider may also do tests (blood gases or pulse oximetry) to see how well your lungs are working. TREATMENT  Some forms of pneumonia may be spread to other people when you cough or sneeze. You may be asked to wear a mask before and during your exam. Pneumonia that is caused by bacteria is treated with antibiotic medicine. Pneumonia that is caused by the influenza virus may be treated with an antiviral medicine. Most other viral infections must run their course. These infections will not respond to antibiotics.  HOME CARE INSTRUCTIONS   Cough suppressants may be used if you are losing too much rest. However, coughing protects you by clearing your lungs. You should avoid using cough suppressants if you can.  Your health care provider may have prescribed medicine if he or she thinks your pneumonia is caused by bacteria or influenza. Finish your medicine even if you start to feel better.  Your health care provider may also prescribe an expectorant. This loosens the mucus to be coughed up.  Take medicines only as directed by your health care  provider.  Do not smoke. Smoking is a common cause of bronchitis and can contribute to pneumonia. If you are a smoker and continue to smoke, your cough may last several weeks after your pneumonia has cleared.  A cold steam vaporizer or humidifier in your room or home may help loosen mucus.  Coughing is often worse at night. Sleeping in a semi-upright position in a recliner or using a couple pillows under your head will help with this.  Get rest as you feel it is needed. Your body will usually let you know when you need to rest. PREVENTION A pneumococcal shot (vaccine) is available to prevent a common bacterial cause of pneumonia. This is usually suggested for:  People over 24 years old.  Patients on chemotherapy.  People with chronic lung problems, such as bronchitis or emphysema.  People with immune system problems. If you are over 65 or have a high risk condition, you may receive the pneumococcal vaccine if you have not received it before. In some countries, a routine influenza vaccine is also recommended. This vaccine can help prevent some cases of pneumonia.You may be offered the influenza vaccine as part of your care. If you smoke, it is time to quit. You may receive instructions on how to stop smoking. Your health care provider can provide medicines and counseling to help you quit. SEEK MEDICAL CARE IF: You have a fever. SEEK IMMEDIATE MEDICAL CARE IF:   Your illness becomes worse. This is especially true if you are elderly or weakened from any other disease.  You cannot control your cough  with suppressants and are losing sleep.  You begin coughing up blood.  You develop pain which is getting worse or is uncontrolled with medicines.  Any of the symptoms which initially brought you in for treatment are getting worse rather than better.  You develop shortness of breath or chest pain. MAKE SURE YOU:   Understand these instructions.  Will watch your condition.  Will get  help right away if you are not doing well or get worse. Document Released: 11/23/2005 Document Revised: 04/09/2014 Document Reviewed: 02/12/2011 [facility] Patient Information 2015 [place], [place]. This information is not intended to replace advice given to you by your health care provider. Make sure you discuss any questions you have with your health care provider.

## 2014-11-16 NOTE — Telephone Encounter (Signed)
PLEASE NOTE: All timestamps contained within this report are represented as [place] Standard Time. CONFIDENTIALTY NOTICE: This fax transmission is intended only for the addressee. It contains information that is legally privileged, confidential or otherwise protected from use or disclosure. If you are not the intended recipient, you are strictly prohibited from reviewing, disclosing, copying using or disseminating any of this information or taking any action in reliance on or regarding this information. If you have received this fax in error, please notify us immediately by telephone so that we can arrange for its return to [place]. Phone: [phone], Toll-Free: [phone], Fax: [phone] Page: 1 of 1 Call Id: [id number] [place] [facility] - Client [place] Patient Name: [facility] Gender: Female DOB: [birth year] Age: 58 Y 10 M 15 D Return Phone Number: [phone] (Primary) Address: City/State/Zip: [place] Client [facility] - Client Client Site [place] - Day Physician [name] Contact Type Call Call Type Triage / Clinical Relationship To Patient Self Return Phone Number [phone] (Primary) Chief Complaint Unclassified Symptom Initial Comment Caller states she just saw the Dr, has pneumonia type symptoms and is being treated for pneumonia without a chest xray to verify. Is going to a party this evening and children will be there. Is she contagious? Nurse Assessment Nurse: Donalynn Furlong, RN, Myna Hidalgo Date/Time Eilene Ghazi Time): 11/16/2014 10:40:42 AM Confirm and document reason for call. If symptomatic, describe symptoms. ---Caller states she just saw the Dr, has pneumonia type symptoms and is being treated for pneumonia without a chest xray to verify. Is going to a party this evening and children will be there. Is she contagious? Has the patient traveled out of the  country within the last 30 days? ---Not Applicable Does the patient require triage? ---No Please document clinical information provided and list any resource used. ---Referencing [employer] and General Nursing Knowledge, discussed methods to prevent transmission of illnesses in general. Also discussed her phase of illness, convalescing, and facts regarding this phase, in addition to prophylaxis. Good comprehension noted Guidelines Guideline Title Affirmed Question Affirmed Notes Nurse  Date/Time (Eastern Time) Disp. Time Eilene Ghazi Time) Disposition Final User 11/16/2014 10:51:13 AM Clinical Call Yes Donalynn Furlong, RN, Myna Hidalgo After Care Instructions Given Call Event Type User Date / Time Description

## 2014-11-16 NOTE — Telephone Encounter (Signed)
She is ok as long as she does not have a fever right? Please advise

## 2014-11-16 NOTE — Progress Notes (Signed)
[name], MD Phone: [phone]  Subjective:   Alexis Wallace is a 58 y.o. year old very pleasant female patient who presents with the following:  Cough/congestion/mild dyspnea on exertion -2 weeks ago you started with symptoms of diffuse body, joint, skin aching and a very bad headache. Had mild cough, congestion as well. Went to urgent care and was told she had UTI. Flu test was negative. Took ciprofloxacin for a week-shot of phenergan and toradol as well which resolved headache. Then it felt like things moved down into her chest. Cannot take a deep breath without coughing. Coughing is productive and she swallows it. Mild left sided chest pain with the cough or if she turns in a specific direction. No chest x-ray.   ROS-mild sob climbing up stairs, otherwise none. Fevers around time of initial illness-none recent. Does have some chills.   Past Medical History- Patient Active Problem List   Diagnosis Date Noted  . Rosacea 08/30/2014    Priority: Medium  . Insomnia 08/30/2014    Priority: Medium  . Bariatric surgery status 09/01/2010    Priority: Medium  . 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 Family history of hypertrophic cardiomyopathy 05/13/2011    Priority: Low  . Actinic keratosis 10/22/2010    Priority: Low  . 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 ALLERGIC RHINITIS 03/01/2008    Priority: Low  . ANEMIA-IRON DEFICIENCY 11/21/2007    Priority: Low  . ANEMIA, B12 DEFICIENCY 08/15/2007    Priority: Low  . GERD 06/02/2007    Priority: Low  . PSORIASIS 06/02/2007    Priority: Low  . OSTEOARTHRITIS 06/02/2007    Priority: Low  . OSTEOPENIA 06/02/2007    Priority: Low  . CAP (community acquired pneumonia) 11/18/2014   Medications- reviewed and updated Current Outpatient Prescriptions  Medication Sig Dispense Refill  . amitriptyline (ELAVIL) 10 MG tablet Take 10 mg by mouth at bedtime.    . calcium  carbonate 200 MG capsule Take 3,000 mg by mouth daily.    Marland Kitchen estradiol (ESTRACE) 2 MG tablet Take 2 mg by mouth daily.      . hydrOXYzine (ATARAX/VISTARIL) 10 MG tablet Take 10 mg by mouth daily.    . minocycline (DYNACIN) 50 MG tablet Take 50 mg by mouth 2 (two) times daily.    . Multiple Vitamin (MULTIVITAMIN) capsule Take 1 capsule by mouth daily.      Marland Kitchen omeprazole (PRILOSEC) 20 MG capsule Take 1 capsule (20 mg total) by mouth 2 (two) times daily. 60 capsule 11  . progesterone (PROMETRIUM) 200 MG capsule Take 200 mg by mouth daily. 12 days every 3 months     . temazepam (RESTORIL) 15 MG capsule TAKE ONE CAPSULE AT BEDTIME 30 capsule 5  . Albuterol Sulfate (PROAIR RESPICLICK) 924 (90 BASE) MCG/ACT AEPB Inhale 2 puffs into the lungs every 6 (six) hours as needed. 1 each 0  . amoxicillin-clavulanate (AUGMENTIN) 875-125 MG per tablet Take 1 tablet by mouth 2 (two) times daily. 14 tablet 0  . azithromycin (ZITHROMAX) 250 MG tablet 2 tabs day 1, then 1 tab daily until finished 6 tablet 0   Current Facility-Administered Medications  Medication Dose Route Frequency Provider Last Rate Last Dose  . triamcinolone acetonide (KENALOG) 10 MG/ML injection 10 mg  10 mg Other Once Celesta Gentile, DPM        Objective: BP 122/70 mmHg  Pulse 66  Temp(Src) 97.7 F (  36.5 C)  Wt 227 lb (102.967 kg)  SpO2 97% Gen: NAD, appears fatigued, coarse "wet" cough recurrent CV: RRR no murmurs rubs or gallops Lungs: right lung base with crackles, also mild wheeze and rhonchi Abdomen: soft/nontender specifically no suprapubic tenderness/nondistended/normal bowel sounds.  No CVA tenderness Ext: 1+ edema Skin: warm, dry, no rash  Neuro: grossly normal, moves all extremities   Assessment/Plan:  CAP (community acquired pneumonia) Cough/congestion/mild DOE with isolated crackles RLL strongly suggestive of pneumonia. Considered CXR but we discussed likely would not change management.  Took a quinolone within last 2  weeks. Treat with azithromycin and augmentin given this. Patient will return tues-Thursday of next week for repeat evaluation to ensure improvement and given strict return precautions to care/such as ER over weekend.   Also gave patient a coupon for proair respiclick with mild wheeze and DOE to see if can improve flow to lungs.   Meds ordered this encounter  Medications  . amoxicillin-clavulanate (AUGMENTIN) 875-125 MG per tablet    Sig: Take 1 tablet by mouth 2 (two) times daily.    Dispense:  14 tablet    Refill:  0  . azithromycin (ZITHROMAX) 250 MG tablet    Sig: 2 tabs day 1, then 1 tab daily until finished    Dispense:  6 tablet    Refill:  0  . Albuterol Sulfate (PROAIR RESPICLICK) 409 (90 BASE) MCG/ACT AEPB    Sig: Inhale 2 puffs into the lungs every 6 (six) hours as needed.    Dispense:  1 each    Refill:  0

## 2014-11-16 NOTE — Telephone Encounter (Signed)
Is contagious (bacteria that caused pneumonia but would not necessarily cause someone else to have pneumonia). I would recommend her wait until at least 48 hours of antibiotics before going to parties.

## 2014-11-18 DIAGNOSIS — J189 Pneumonia, unspecified organism: Secondary | ICD-10-CM | POA: Insufficient documentation

## 2014-11-18 HISTORY — DX: Pneumonia, unspecified organism: J18.9

## 2014-11-18 NOTE — Assessment & Plan Note (Signed)
Cough/congestion/mild DOE with isolated crackles RLL strongly suggestive of pneumonia. Considered CXR but we discussed likely would not change management.  Took a quinolone within last 2 weeks. Treat with azithromycin and augmentin given this. Patient will return tues-Thursday of next week for repeat evaluation to ensure improvement and given strict return precautions to care/such as ER over weekend.

## 2014-11-29 ENCOUNTER — Ambulatory Visit (INDEPENDENT_AMBULATORY_CARE_PROVIDER_SITE_OTHER): Payer: BC Managed Care – PPO | Admitting: Family Medicine

## 2014-11-29 ENCOUNTER — Ambulatory Visit (INDEPENDENT_AMBULATORY_CARE_PROVIDER_SITE_OTHER)
Admission: RE | Admit: 2014-11-29 | Discharge: 2014-11-29 | Disposition: A | Discharge status: Home / Self Care | Payer: BC Managed Care – PPO | Source: Ambulatory Visit | Attending: Family Medicine | Admitting: Family Medicine

## 2014-11-29 ENCOUNTER — Telehealth: Payer: Self-pay | Admitting: Family Medicine

## 2014-11-29 ENCOUNTER — Ambulatory Visit: Payer: BC Managed Care – PPO | Admitting: Family Medicine

## 2014-11-29 ENCOUNTER — Encounter: Payer: Self-pay | Admitting: Family Medicine

## 2014-11-29 ENCOUNTER — Telehealth: Payer: Self-pay

## 2014-11-29 VITALS — BP 140/72 | Temp 98.0°F | Wt 228.0 lb

## 2014-11-29 DIAGNOSIS — R05 Cough: Secondary | ICD-10-CM

## 2014-11-29 DIAGNOSIS — R059 Cough, unspecified: Secondary | ICD-10-CM

## 2014-11-29 DIAGNOSIS — R911 Solitary pulmonary nodule: Secondary | ICD-10-CM

## 2014-11-29 DIAGNOSIS — M722 Plantar fascial fibromatosis: Secondary | ICD-10-CM

## 2014-11-29 NOTE — Patient Instructions (Signed)
CT scan early next week. If you developed shortness of breath, chest pain, please seek care next week. I won't be back in the office until after the new year but I will try to take a look at your scan next week.   Refer to Dr. Oneida Alar of sports medicine to eval plantar fasciitis

## 2014-11-29 NOTE — Telephone Encounter (Signed)
Have her go to [facility] for chest x-ray. Please inform patient. They close at 2 and I will be away from my computer after 2pm. She needs to get this as early as possible. Another option would be for her to go to urgent care for evaluation if symptoms worsen.

## 2014-11-29 NOTE — Telephone Encounter (Signed)
Pt has been scheduled.  °

## 2014-11-29 NOTE — Telephone Encounter (Signed)
Patient is coming today at 1:15 pm.

## 2014-11-29 NOTE — Telephone Encounter (Signed)
Pt notified and is headed to [employer]

## 2014-11-29 NOTE — Telephone Encounter (Signed)
Please call pt and make her a f/u appt for next week with anybody since Dr. Yong Channel is not going to be available. There was no pneumonia found but they need to f/u on the nodule that was found.

## 2014-11-29 NOTE — Telephone Encounter (Signed)
Dr. Hunter please advise. 

## 2014-11-29 NOTE — Telephone Encounter (Signed)
Pt states she had PNA a couple of weeks ago, (12/11) and now thinks she is beginning to get the same symptoms.  Has a deep cough, low grade fever last night, and thinks that perhaps her issue has not completely cleared up.  Pt knows you will be out and [place] and would like your advise.

## 2014-11-29 NOTE — Telephone Encounter (Signed)
Mrs. Alexis Wallace please inform pt Dr. Yong Channel wants pt to come in at 1:00 today to discuss and she can bring her sister then. You can create a spot. Thank you

## 2014-11-29 NOTE — Telephone Encounter (Signed)
Pt states she has a couple of questions she would like to ask Dr Retail banker about her condition. pls cb.

## 2014-11-29 NOTE — Telephone Encounter (Signed)
Alexis Wallace [phone] is the patient's sister and she would like to speak to you about the patient's medical condition.  The patient is calling to give verbal consent for Dr. Yong Channel to speak with her.  Her sister is a physician but will not handle patient's plan of care, she is just wanting more information.  Patient is aware that normal protocol is to have a DPR on file but she is asking to give verbal consent at this time.

## 2014-11-29 NOTE — Telephone Encounter (Signed)
Needs to come by to sign form.

## 2014-11-29 NOTE — Progress Notes (Signed)
Alexis Reddish, MD Phone: [phone]  Subjective:   Alexis Wallace is a 58 y.o. year old very pleasant female patient who presents with the following:  Cough/congestion-new Pulmonary Nodule-discovered on CXR Started a few days ago with deep cough again after 2-3 days of complete resolution. Dry cough. Feels mildly short of breath. Feels like can't deep breath. More shallow breathing. Low grade temperature to around 99. Ride was 6 hours back from [place].  ROS- no chest pain. No chills. No trouble with appetite.  Does admit to chronic plantar fasciitis for which she would like another opinion.   Past Medical History- Patient Active Problem List   Diagnosis Date Noted  . Rosacea 08/30/2014    Priority: Medium  . Insomnia 08/30/2014    Priority: Medium  . Bariatric surgery status 09/01/2010    Priority: Medium  . 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 Family history of hypertrophic cardiomyopathy 05/13/2011    Priority: Low  . Actinic keratosis 10/22/2010    Priority: Low  . 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 ALLERGIC RHINITIS 03/01/2008    Priority: Low  . ANEMIA-IRON DEFICIENCY 11/21/2007    Priority: Low  . ANEMIA, B12 DEFICIENCY 08/15/2007    Priority: Low  . GERD 06/02/2007    Priority: Low  . PSORIASIS 06/02/2007    Priority: Low  . OSTEOARTHRITIS 06/02/2007    Priority: Low  . OSTEOPENIA 06/02/2007    Priority: Low  . CAP (community acquired pneumonia) 11/18/2014   Medications- reviewed and updated Current Outpatient Prescriptions  Medication Sig Dispense Refill  . Albuterol Sulfate (PROAIR RESPICLICK) 426 (90 BASE) MCG/ACT AEPB Inhale 2 puffs into the lungs every 6 (six) hours as needed. 1 each 0  . amitriptyline (ELAVIL) 10 MG tablet Take 10 mg by mouth at bedtime.    Marland Kitchen amoxicillin-clavulanate (AUGMENTIN) 875-125 MG per tablet Take 1 tablet by mouth 2 (two) times daily. 14 tablet 0  .  azithromycin (ZITHROMAX) 250 MG tablet 2 tabs day 1, then 1 tab daily until finished 6 tablet 0  . calcium carbonate 200 MG capsule Take 3,000 mg by mouth daily.    Marland Kitchen estradiol (ESTRACE) 2 MG tablet Take 2 mg by mouth daily.      . hydrOXYzine (ATARAX/VISTARIL) 10 MG tablet Take 10 mg by mouth daily.    . minocycline (DYNACIN) 50 MG tablet Take 50 mg by mouth 2 (two) times daily.    . Multiple Vitamin (MULTIVITAMIN) capsule Take 1 capsule by mouth daily.      Marland Kitchen omeprazole (PRILOSEC) 20 MG capsule Take 1 capsule (20 mg total) by mouth 2 (two) times daily. 60 capsule 11  . progesterone (PROMETRIUM) 200 MG capsule Take 200 mg by mouth daily. 12 days every 3 months     . temazepam (RESTORIL) 15 MG capsule TAKE ONE CAPSULE AT BEDTIME 30 capsule 5   Current Facility-Administered Medications  Medication Dose Route Frequency Provider Last Rate Last Dose  . triamcinolone acetonide (KENALOG) 10 MG/ML injection 10 mg  10 mg Other Once Celesta Gentile, DPM        Objective: BP 140/72 mmHg  Temp(Src) 98 F (36.7 C)  Wt 228 lb (103.42 kg) Gen: NAD, resting comfortably Turbinates erythematous, oropharynx normal, no cough during visit CV: RRR no murmurs rubs or gallops Lungs: CTAB no crackles, wheeze, rhonchi. Crackles no longer heard . Abdomen: soft/nontender/nondistended/normal bowel sounds. Ext: no edema, no calf pain or  tenderness Skin: warm, dry, no rash  Neuro: grossly normal, moves all extremities   Assessment/Plan:  Pulmonary Nodule on CXR Former smoker but only 5 pack years. Suspect patient is very low risk. And nodule likely incidental finding. Regardless, will get CT chest to further evaluate as malignancy is certainly on differential.   Patient was also concerned about recurrence of pneumonia but CXR clear and right lower lobe crackles now resolved. Patient likely with URI  Also patient mentions history of recurrent plantar fasciitis for which she has seen orthopedics and little was  done, podiatry and had 2 steroid injections and orthotics made. Decision made to send to Dr. Oneida Alar of sports medicine for another perspective/eval.   Strict  Return precautions advised.   Orders Placed This Encounter  Procedures  . CT Chest Wo Contrast    Standing Status: Future     Number of Occurrences:      Standing Expiration Date: 01/31/2016    Scheduling Instructions:     Early next week is preferred.    Order Specific Question:  Reason for Exam (SYMPTOM  OR DIAGNOSIS REQUIRED)    Answer:  solitary pulmonary nodule on x-ray    Order Specific Question:  Is the patient pregnant?    Answer:  No    Order Specific Question:  Preferred imaging location?    Answer:  [facility]  . Ambulatory referral to Sports Medicine    Referral Priority:  Routine    Referral Type:  Consultation    Referred to Provider:  Stefanie Libel, MD    Number of Visits Requested:  1

## 2014-11-29 NOTE — Telephone Encounter (Signed)
Pt has been sch 12/28 dr Maudie Mercury

## 2014-11-29 NOTE — Telephone Encounter (Signed)
See below

## 2014-12-03 ENCOUNTER — Ambulatory Visit: Payer: BC Managed Care – PPO | Admitting: Family Medicine

## 2014-12-04 ENCOUNTER — Ambulatory Visit (HOSPITAL_COMMUNITY)
Admission: RE | Admit: 2014-12-04 | Discharge: 2014-12-04 | Disposition: A | Discharge status: Home / Self Care | Payer: BC Managed Care – PPO | Source: Ambulatory Visit | Attending: Family Medicine | Admitting: Family Medicine

## 2014-12-04 DIAGNOSIS — R911 Solitary pulmonary nodule: Secondary | ICD-10-CM | POA: Diagnosis present

## 2014-12-05 ENCOUNTER — Encounter: Payer: Self-pay | Admitting: Family Medicine

## 2014-12-05 DIAGNOSIS — R911 Solitary pulmonary nodule: Secondary | ICD-10-CM | POA: Insufficient documentation

## 2014-12-05 HISTORY — DX: Solitary pulmonary nodule: R91.1

## 2014-12-12 ENCOUNTER — Ambulatory Visit (INDEPENDENT_AMBULATORY_CARE_PROVIDER_SITE_OTHER): Payer: BC Managed Care – PPO | Admitting: Family Medicine

## 2014-12-12 ENCOUNTER — Encounter: Payer: Self-pay | Admitting: Family Medicine

## 2014-12-12 VITALS — BP 154/98 | HR 65 | Ht 67.0 in | Wt 220.0 lb

## 2014-12-12 DIAGNOSIS — M722 Plantar fascial fibromatosis: Secondary | ICD-10-CM

## 2014-12-12 NOTE — Patient Instructions (Signed)
You have plantar fasciitis Take tylenol or aleve as needed for pain  Plantar fascia stretch for 20-30 seconds (do 3 of these) in morning Lowering/raise on a step exercises 3 x 10 once or twice a day - this is very important for long term recovery. Can add heel walks, toe walks forward and backward as well Ice heel for 15 minutes as needed. Avoid flat shoes/barefoot walking as much as possible. Arch straps have been shown to help with pain - use these when up and walking around at all times. Continue your orthotics. Steroid injection is a consideration for short term pain relief if you are struggling - I don't think I would do a third one of these. Start physical therapy. Consider night splints if not improving. Follow up with me in 6 weeks for reevaluation.

## 2014-12-13 ENCOUNTER — Encounter: Payer: Self-pay | Admitting: Family Medicine

## 2014-12-13 ENCOUNTER — Telehealth: Payer: Self-pay | Admitting: *Deleted

## 2014-12-13 DIAGNOSIS — M722 Plantar fascial fibromatosis: Secondary | ICD-10-CM | POA: Insufficient documentation

## 2014-12-13 NOTE — Telephone Encounter (Signed)
Patient would like to know if they can get a temporary handicap placard.

## 2014-12-13 NOTE — Progress Notes (Signed)
PCP and referred by: [name], MD  Subjective:   HPI: Patient is a 59 y.o. female here for right foot pain.  Patient reports she's had problems with right foot for about 18 months. No injury or trauma preceding this. Was given custom orthotics by one provider and also has tried two injections into plantar fascia. First one lasted a month, second one with no relief. Has not been shown any exercises to do, not tried arch binders.  Past Medical History  Diagnosis Date  . Osteoarthritis   . GERD (gastroesophageal reflux disease)   . Osteopenia   . Iron deficiency   . Anemia, iron deficiency   . B12 deficiency   . Allergy     Current Outpatient Prescriptions on File Prior to Visit  Medication Sig Dispense Refill  . Albuterol Sulfate (PROAIR RESPICLICK) 562 (90 BASE) MCG/ACT AEPB Inhale 2 puffs into the lungs every 6 (six) hours as needed. 1 each 0  . amitriptyline (ELAVIL) 10 MG tablet Take 10 mg by mouth at bedtime.    . calcium carbonate 200 MG capsule Take 3,000 mg by mouth daily.    Marland Kitchen estradiol (ESTRACE) 2 MG tablet Take 2 mg by mouth daily.      . hydrOXYzine (ATARAX/VISTARIL) 10 MG tablet Take 10 mg by mouth daily.    . minocycline (DYNACIN) 50 MG tablet Take 50 mg by mouth 2 (two) times daily.    . Multiple Vitamin (MULTIVITAMIN) capsule Take 1 capsule by mouth daily.      Marland Kitchen omeprazole (PRILOSEC) 20 MG capsule Take 1 capsule (20 mg total) by mouth 2 (two) times daily. 60 capsule 11  . progesterone (PROMETRIUM) 200 MG capsule Take 200 mg by mouth daily. 12 days every 3 months     . temazepam (RESTORIL) 15 MG capsule TAKE ONE CAPSULE AT BEDTIME 30 capsule 5   Current Facility-Administered Medications on File Prior to Visit  Medication Dose Route Frequency Provider Last Rate Last Dose  . triamcinolone acetonide (KENALOG) 10 MG/ML injection 10 mg  10 mg Other Once Celesta Gentile, DPM        Past Surgical History  Procedure Laterality Date  . Gastric bypass  2001  .  Cosmetic surgery      tummy tuck  . Carpal tunnel right      No Known Allergies  History   Social History  . Marital Status: Married    Spouse Name: N/A    Number of Children: N/A  . Years of Education: N/A   Occupational History  . Not on file.   Social History Main Topics  . Smoking status: Former Smoker -- 1.00 packs/day for 5 years    Types: Cigarettes    Quit date: 02/10/1978  . Smokeless tobacco: Not on file  . Alcohol Use: 0.0 oz/week    0 Glasses of wine per week     Comment: rare  . Drug Use: No  . Sexual Activity: Yes   Other Topics Concern  . Not on file   Social History Narrative   Married 1976. 2 kids. Jenny Reichmann (his wife had TBI being struck by car) with daughter [name]. Patient takes care of [name] and son lives with her and husband) and Colletta Maryland lives in [place] with no kids.       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 Raising granddaughter after daughter has TBI.  Hobbies: spending time with friends and family             Family History  Problem Relation Age of Onset  . Heart disease Father   . Heart disease Sister     Hypertrophic cardiomyopathy  . Lung cancer Mother     stage IV, smoker    BP 154/98 mmHg  Pulse 65  Ht 5\' 7"  (1.702 m)  Wt 220 lb (99.791 kg)  BMI 34.45 kg/m2  Review of Systems: See HPI above.    Objective:  Physical Exam:  Gen: NAD  Right foot/ankle: No gross deformity, swelling, ecchymoses.  Mild cavus. FROM TTP plantar fascia proximally. Negative ant drawer and talar tilt.   Negative syndesmotic compression. Negative calcaneal squeeze. Thompsons test negative. NV intact distally.    Assessment & Plan:  1. Right plantar fasciitis - shown home exercises to do daily.  Would not repeat injection - has had two of these already.  Arch binder, physical therapy, continue with orthotics.  Icing, tylenol or nsaids if needed.  Consider  night splints if not improving.  F/u in 6 weeks.

## 2014-12-13 NOTE — Telephone Encounter (Signed)
No - cannot get a handicap placard for plantar fasciitis.

## 2014-12-13 NOTE — Assessment & Plan Note (Signed)
shown home exercises to do daily.  Would not repeat injection - has had two of these already.  Arch binder, physical therapy, continue with orthotics.  Icing, tylenol or nsaids if needed.  Consider night splints if not improving.  F/u in 6 weeks.

## 2014-12-14 ENCOUNTER — Ambulatory Visit (INDEPENDENT_AMBULATORY_CARE_PROVIDER_SITE_OTHER): Payer: BC Managed Care – PPO | Admitting: Internal Medicine

## 2014-12-14 ENCOUNTER — Encounter: Payer: Self-pay | Admitting: Internal Medicine

## 2014-12-14 VITALS — BP 122/82 | HR 70 | Ht 67.0 in | Wt 227.6 lb

## 2014-12-14 DIAGNOSIS — J189 Pneumonia, unspecified organism: Secondary | ICD-10-CM

## 2014-12-14 DIAGNOSIS — R911 Solitary pulmonary nodule: Secondary | ICD-10-CM

## 2014-12-14 NOTE — Patient Instructions (Addendum)
Whenever coughing for any reason:  Prilosec Take 30- 60 min before your first and last meals of the day   GERD (REFLUX)  is an extremely common cause of respiratory symptoms just like yours , many times with no obvious heartburn at all.    It can be treated with medication, but also with lifestyle changes including avoidance of late meals, excessive alcohol, smoking cessation, and avoid fatty foods, chocolate, peppermint, colas, red wine, and acidic juices such as orange juice.  NO MINT OR MENTHOL PRODUCTS SO NO COUGH DROPS  USE SUGARLESS CANDY INSTEAD (Jolley ranchers or Stover's or Life Savers) or even ice chips will also do - the key is to swallow to prevent all throat clearing. NO OIL BASED VITAMINS - use powdered substitutes.  We will call you in dec 2016 to make sure you have a follow up ct but this is a very low risk situation

## 2014-12-14 NOTE — Progress Notes (Signed)
Subjective:    Patient ID: Alexis Wallace, female    DOB: 09/03/1956  MRN: [id number]  HPI  88 yowf quit smoking 1979 p 5 years and noted onset of seasonal allergies (fall = spring)  In early 19080s pmoved to [place] in late 1970s controlled with otcs but oct 2015 with acutee onset of nonspecific  nasal symptoms then bad cough > UC > [facility] > total of 3 abx last round zmax/augmentin seemed to help transiently > CT due to cough 12/209/15  3 mm nodule, no def pna,  so self referred to pulmonary clinic 12/14/14    12/14/2014 1st [facility] office visit/ [facility]   Chief Complaint  Patient presents with  . Pulmonary Consult    Self referral for eval of pulmonary nodules. Pt states that cxr was done initially for eval of cough and was eventually dxed with PNA. Cough has resolved.    cough was mostly prod white mucus x sev tsp at most,  day > night /  some better p 3 rd abx but still throat clearling some Was taking prilosec maint but at hs prior to and during the cough  No better with saba while coughing   No obvious day to day or daytime variabilty or assoc   sob or cp or chest tightness, subjective wheeze overt sinus or hb symptoms. No unusual exp hx or h/o childhood pna/ asthma or knowledge of premature birth.  Sleeping ok without nocturnal  or early am exacerbation  of respiratory  c/o's or need for noct saba. Also denies any obvious fluctuation of symptoms with weather or environmental changes or other aggravating or alleviating factors except as outlined above   Current Medications, Allergies, Complete Past Medical History, Past Surgical History, Family History, and Social History were reviewed in [employer] record.   .          Review of Systems  Constitutional: Negative for fever, chills and unexpected weight change.  HENT: Negative for congestion, dental problem, ear pain, nosebleeds, postnasal drip, rhinorrhea, sinus pressure, sneezing, sore throat, trouble  swallowing and voice change.   Eyes: Negative for visual disturbance.  Respiratory: Negative for cough, choking and shortness of breath.   Cardiovascular: Negative for chest pain and leg swelling.  Gastrointestinal: Negative for vomiting, abdominal pain and diarrhea.  Genitourinary: Negative for difficulty urinating.  Musculoskeletal: Negative for arthralgias.  Skin: Negative for rash.  Neurological: Negative for tremors, syncope and headaches.  Hematological: Does not bruise/bleed easily.       Objective:   Physical Exam  amb wf nad   Wt Readings from Last 3 Encounters:  12/14/14 227 lb 9.6 oz (103.239 kg)  12/12/14 220 lb (99.791 kg)  11/29/14 228 lb (103.42 kg)    Vital signs reviewed    HEENT: nl dentition, turbinates, and orophanx. Nl external ear canals without cough reflex   NECK :  without JVD/Nodes/TM/ nl carotid upstrokes bilaterally   LUNGS: no acc muscle use, clear to A and P bilaterally without cough on insp or exp maneuvers   CV:  RRR  no s3 or murmur or increase in P2, no edema   ABD:  soft and nontender with nl excursion in the supine position. No bruits or organomegaly, bowel sounds nl  MS:  warm without deformities, calf tenderness, cyanosis or clubbing  SKIN: warm and dry without lesions    NEURO:  alert, approp, no deficits    Chest CT 11/29/14 3 mm spn sup seg  LLL, no air space dz      Assessment & Plan:

## 2014-12-15 ENCOUNTER — Encounter: Payer: Self-pay | Admitting: Internal Medicine

## 2014-12-15 NOTE — Assessment & Plan Note (Signed)
Clinically resolved p approp rx though lingering cough typical of upper airway cough/ cyclical mech with secondary gerd/ reviewed with pt

## 2014-12-15 NOTE — Assessment & Plan Note (Signed)
CT results reviewed with pt >>> Too small for PET or bx, not suspicious enough for excisional bx > really only option for now is follow the Fleischner society guidelines as rec by radiology > agree with plans for CT in one year as already rec (she is very very low risk but hard to say zero)  Discussed in detail all the  indications, usual  risks and alternatives  relative to the benefits with patient who agrees to proceed with conservative rx as outllined assuming no recurrent cough or other resp symptoms in meantime  See instructions for specific recommendations which were reviewed directly with the patient who was given a copy with highlighter outlining the key components.

## 2015-01-29 ENCOUNTER — Ambulatory Visit: Payer: BC Managed Care – PPO | Admitting: Family Medicine

## 2015-01-31 ENCOUNTER — Telehealth: Payer: Self-pay

## 2015-01-31 NOTE — Telephone Encounter (Signed)
Yes for sleep only and place qualifier 3x a week max. 6 months worth ok.

## 2015-01-31 NOTE — Telephone Encounter (Signed)
Refill request for Temazepam 15mg  #30. Is this ok to refill?

## 2015-02-01 MED ORDER — TEMAZEPAM 15 MG PO CAPS: 15.0000 mg | ORAL_CAPSULE | Freq: Every evening | ORAL | Status: DC | PRN

## 2015-02-01 NOTE — Addendum Note (Signed)
Addended by: Clyde Lundborg A on: 02/01/2015 08:53 AM   Modules accepted: Orders

## 2015-02-01 NOTE — Telephone Encounter (Signed)
Medication called in 

## 2015-02-12 ENCOUNTER — Ambulatory Visit (INDEPENDENT_AMBULATORY_CARE_PROVIDER_SITE_OTHER): Payer: BC Managed Care – PPO | Admitting: Internal Medicine

## 2015-02-12 ENCOUNTER — Encounter: Payer: Self-pay | Admitting: Internal Medicine

## 2015-02-12 DIAGNOSIS — R8290 Unspecified abnormal findings in urine: Secondary | ICD-10-CM

## 2015-02-12 LAB — POCT URINALYSIS DIPSTICK
BILIRUBIN UA: NEGATIVE
Glucose, UA: NEGATIVE
Ketones, UA: NEGATIVE
Leukocytes, UA: NEGATIVE
Nitrite, UA: NEGATIVE
PROTEIN UA: NEGATIVE
RBC UA: NEGATIVE
Spec Grav, UA: 1.01
UROBILINOGEN UA: 0.2
pH, UA: 7

## 2015-02-12 NOTE — Patient Instructions (Signed)
Drink as much fluid as you  can tolerate over the next few days  Call or return to clinic prn if these symptoms worsen or fail to improve as anticipated.  

## 2015-02-12 NOTE — Progress Notes (Signed)
Pre visit review using our clinic review tool, if applicable. No additional management support is needed unless otherwise documented below in the visit note. 

## 2015-02-12 NOTE — Progress Notes (Signed)
Subjective:    Patient ID: Alexis Wallace, female    DOB: Apr 28, 1956, 59 y.o.   MRN: [id number]  HPI 59 year old patient who presents with a chief complaint of a 4-6 week history of  strong odor to her urine.  No dysuria or frequency. Patient has not identified any change in her diet or medications.  She does state that today her urine odor has improved. The urinalysis was reviewed and was normal  Past Medical History  Diagnosis Date  . Osteoarthritis   . GERD (gastroesophageal reflux disease)   . Osteopenia   . Iron deficiency   . Anemia, iron deficiency   . B12 deficiency   . Allergy     History   Social History  . Marital Status: Married    Spouse Name: N/A  . Number of Children: N/A  . Years of Education: N/A   Occupational History  . Not on file.   Social History Main Topics  . Smoking status: Former Smoker -- 1.00 packs/day for 5 years    Types: Cigarettes    Quit date: 02/10/1978  . Smokeless tobacco: Not on file  . Alcohol Use: 0.0 oz/week    0 Glasses of wine per week     Comment: rare  . Drug Use: No  . Sexual Activity: Yes   Other Topics Concern  . Not on file   Social History Narrative   Married 1976. 2 kids. Alexis Wallace (his wife had TBI being struck by car) with daughter Alexis Wallace. Patient takes care of Alexis Wallace and son lives with her and husband) and Alexis Wallace lives in [place] with no kids.       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 Raising granddaughter after daughter has TBI.       Hobbies: spending time with friends and family             Past Surgical History  Procedure Laterality Date  . Gastric bypass  2001  . Cosmetic surgery      tummy tuck  . Carpal tunnel right      Family History  Problem Relation Age of Onset  . Heart disease Father   . Heart disease Sister     Hypertrophic cardiomyopathy  . Lung cancer Mother     stage IV, smoker    No Known  Allergies  Current Outpatient Prescriptions on File Prior to Visit  Medication Sig Dispense Refill  . Albuterol Sulfate (PROAIR RESPICLICK) 749 (90 BASE) MCG/ACT AEPB Inhale 2 puffs into the lungs every 6 (six) hours as needed. 1 each 0  . calcium carbonate 200 MG capsule Take 3,000 mg by mouth daily.    Marland Kitchen estradiol (ESTRACE) 2 MG tablet Take 2 mg by mouth daily.      . Multiple Vitamin (MULTIVITAMIN) capsule Take 1 capsule by mouth daily.      Marland Kitchen omeprazole (PRILOSEC) 20 MG capsule Take 1 capsule (20 mg total) by mouth 2 (two) times daily. (Patient taking differently: Take 20 mg by mouth daily. ) 60 capsule 11  . progesterone (PROMETRIUM) 200 MG capsule Take 200 mg by mouth daily. 12 days every 3 months     . temazepam (RESTORIL) 15 MG capsule Take 1 capsule (15 mg total) by mouth at bedtime as needed for sleep. 30 capsule 0   Current Facility-Administered Medications on File Prior to Visit  Medication Dose Route Frequency Provider Last Rate Last  Dose  . triamcinolone acetonide (KENALOG) 10 MG/ML injection 10 mg  10 mg Other Once Trula Slade, DPM        BP 140/80 mmHg  Pulse 57  Temp(Src) 97.6 F (36.4 C) (Oral)  Resp 20  Ht 5\' 7"  (1.702 m)  Wt 225 lb (102.059 kg)  BMI 35.23 kg/m2  SpO2 98%      Review of Systems  Constitutional: Negative.   HENT: Negative for congestion, dental problem, hearing loss, rhinorrhea, sinus pressure, sore throat and tinnitus.   Eyes: Negative for pain, discharge and visual disturbance.  Respiratory: Negative for cough and shortness of breath.   Cardiovascular: Negative for chest pain, palpitations and leg swelling.  Gastrointestinal: Negative for nausea, vomiting, abdominal pain, diarrhea, constipation, blood in stool and abdominal distention.  Genitourinary: Negative for dysuria, urgency, frequency, hematuria, flank pain, vaginal bleeding, vaginal discharge, difficulty urinating, vaginal pain and pelvic pain.  Musculoskeletal: Negative for  joint swelling, arthralgias and gait problem.  Skin: Negative for rash.  Neurological: Negative for dizziness, syncope, speech difficulty, weakness, numbness and headaches.  Hematological: Negative for adenopathy.  Psychiatric/Behavioral: Negative for behavioral problems, dysphoric mood and agitation. The patient is not nervous/anxious.        Objective:   Physical Exam  Constitutional: She appears well-developed and well-nourished. No distress.          Assessment & Plan:   Rule out UTI.  Urinalysis reviewed and was normal.  Patient has been encouraged to liberalize her fluid intake and will simply observe at this time.

## 2015-04-04 ENCOUNTER — Encounter: Payer: Self-pay | Admitting: Internal Medicine

## 2015-04-05 ENCOUNTER — Encounter: Payer: Self-pay | Admitting: Internal Medicine

## 2015-04-26 ENCOUNTER — Encounter: Payer: Self-pay | Admitting: *Deleted

## 2015-05-28 ENCOUNTER — Other Ambulatory Visit: Payer: Self-pay

## 2015-05-28 ENCOUNTER — Other Ambulatory Visit: Payer: Self-pay | Admitting: Family Medicine

## 2015-05-29 ENCOUNTER — Other Ambulatory Visit: Payer: Self-pay

## 2015-05-29 MED ORDER — TEMAZEPAM 15 MG PO CAPS: 15.0000 mg | ORAL_CAPSULE | Freq: Every evening | ORAL | Status: DC | PRN

## 2015-06-04 ENCOUNTER — Ambulatory Visit (INDEPENDENT_AMBULATORY_CARE_PROVIDER_SITE_OTHER): Payer: BC Managed Care – PPO | Admitting: Family Medicine

## 2015-06-04 ENCOUNTER — Encounter: Payer: Self-pay | Admitting: Family Medicine

## 2015-06-04 VITALS — BP 130/88 | HR 56 | Temp 97.5°F | Wt 214.0 lb

## 2015-06-04 DIAGNOSIS — M722 Plantar fascial fibromatosis: Secondary | ICD-10-CM | POA: Diagnosis not present

## 2015-06-04 DIAGNOSIS — G4700 Insomnia, unspecified: Secondary | ICD-10-CM | POA: Diagnosis not present

## 2015-06-04 DIAGNOSIS — Z9884 Bariatric surgery status: Secondary | ICD-10-CM

## 2015-06-04 MED ORDER — TEMAZEPAM 15 MG PO CAPS: 15.0000 mg | ORAL_CAPSULE | Freq: Every evening | ORAL | Status: DC | PRN

## 2015-06-04 NOTE — Assessment & Plan Note (Signed)
S: down 11 lbs through weight watchers and exercise. Walking during breaks- 20 minutes twice a day. 5 days week.  A/P: update bariatric labs today and in 6 months, if normal, likely space labs to yearly. Congratulated on weight loss

## 2015-06-04 NOTE — Patient Instructions (Addendum)
Have copy of mammogram faxed to [place] at [phone].  Refilled temazepam  Can see if zantac/ranitidine works for reflux  Sorry about your loss  Check with your eye doctor about your eyes, can refer to derm if needed  Update labs today for gastric bypass  Great job on being 11 pounds down- keep up the good work

## 2015-06-04 NOTE — Progress Notes (Signed)
[name], MD  Subjective:  Alexis Wallace is a 59 y.o. year old very pleasant female patient who presents with:  Mother passed 3 weeks ago at age 61 from lung cancer and mets.  High stress-raising granddaughter and working full time. Working at [employer]  Floaters since April. Seen optho- posterior vitreous detachment.   See problem oriented charting ROS- no depressed mood. Foul smelling urine started suddenly a few months ago and persisted- continues to smell. Even if drinks a lot of fluid. No vaginal discharge. Urine was unremarkable for infection that time. Minimal heal pain.   Past Medical History- overactive bladder followed by urology, rosacea  Medications- reviewed and updated Current Outpatient Prescriptions  Medication Sig Dispense Refill  . calcium carbonate 200 MG capsule Take 3,000 mg by mouth daily.    Marland Kitchen estradiol (ESTRACE) 2 MG tablet Take 2 mg by mouth daily.      . Multiple Vitamin (MULTIVITAMIN) capsule Take 1 capsule by mouth daily.      Marland Kitchen omeprazole (PRILOSEC) 20 MG capsule Take 1 capsule (20 mg total) by mouth 2 (two) times daily. (Patient taking differently: Take 20 mg by mouth daily. ) 60 capsule 11  . progesterone (PROMETRIUM) 200 MG capsule Take 200 mg by mouth daily. 12 days every 3 months     . Albuterol Sulfate (PROAIR RESPICLICK) 353 (90 BASE) MCG/ACT AEPB Inhale 2 puffs into the lungs every 6 (six) hours as needed. (Patient not taking: Reported on 06/04/2015) 1 each 0  . temazepam (RESTORIL) 15 MG capsule Take 1 capsule (15 mg total) by mouth at bedtime as needed for sleep. (Patient not taking: Reported on 06/04/2015) 30 capsule 2   Objective: BP 130/88 mmHg  Pulse 56  Temp(Src) 97.5 F (36.4 C)  Wt 214 lb (97.07 kg) Gen: NAD, resting comfortably CV: RRR no murmurs rubs or gallops Lungs: CTAB no crackles, wheeze, rhonchi Abdomen: soft/nontender/nondistended/normal bowel sounds. No rebound or guarding. Obese.  Ext: 1+ pitting  edema Skin: warm, dry, no rash Neuro: grossly normal, moves all extremities   Assessment/Plan:  Insomnia S: reasonable control on temazepam A/P: refilled today for prn use   Bariatric surgery status S: down 11 lbs through weight watchers and exercise. Walking during breaks- 20 minutes twice a day. 5 days week.  A/P: update bariatric labs today and in 6 months, if normal, likely space labs to yearly. Congratulated on weight loss   Plantar fasciitis, right S: saw me, podiatry, sports med, ortho without relief. Ended up seeing an acupuncturist and states 97% better. Still point acupuncture.  A/P: thrilled acupuncture worked for patient. We discussed reasons for return to me or acupuncturist.    S:Dry under eyes. To discuss with optho. Cortisone has helped but does not want to use near eyes A/P: advised talk to optho, we could refer to derm if needed. I am hesitant to use more than lotion that near the eyes without specialist consultation.   6 month follow up.   Orders Placed This Encounter  Procedures  . Comprehensive metabolic panel    [place]  . CBC    [place]  . Iron and TIBC  . Ferritin  . Vitamin B12  . Vit D  25 hydroxy (rtn osteoporosis monitoring)    [place]  . PTH, Intact and Calcium  . Vitamin B1  . Folate    Meds ordered this encounter  Medications  . temazepam (RESTORIL) 15 MG capsule    Sig: Take 1 capsule (15 mg  total) by mouth at bedtime as needed for sleep.    Dispense:  30 capsule    Refill:  5

## 2015-06-04 NOTE — Assessment & Plan Note (Signed)
S: reasonable control on temazepam A/P: refilled today for prn use

## 2015-06-04 NOTE — Assessment & Plan Note (Signed)
S: saw me, podiatry, sports med, ortho without relief. Ended up seeing an acupuncturist and states 97% better. Still point acupuncture.  A/P: thrilled acupuncture worked for patient. We discussed reasons for return to me or acupuncturist.

## 2015-06-05 LAB — IRON AND TIBC
%SAT: 35 % (ref 20–55)
IRON: 91 ug/dL (ref 42–145)
TIBC: 263 ug/dL (ref 250–470)
UIBC: 172 ug/dL (ref 125–400)

## 2015-06-05 LAB — PTH, INTACT AND CALCIUM
Calcium: 8.9 mg/dL (ref 8.4–10.5)
PTH: 60 pg/mL (ref 14–64)

## 2015-06-05 LAB — CBC
HCT: 37.3 % (ref 36.0–46.0)
Hemoglobin: 12.7 g/dL (ref 12.0–15.0)
MCHC: 34.1 g/dL (ref 30.0–36.0)
MCV: 92.3 fl (ref 78.0–100.0)
PLATELETS: 199 10*3/uL (ref 150.0–400.0)
RBC: 4.05 Mil/uL (ref 3.87–5.11)
RDW: 12.4 % (ref 11.5–15.5)
WBC: 4.8 10*3/uL (ref 4.0–10.5)

## 2015-06-05 LAB — COMPREHENSIVE METABOLIC PANEL
ALBUMIN: 3.9 g/dL (ref 3.5–5.2)
ALT: 18 U/L (ref 0–35)
AST: 23 U/L (ref 0–37)
Alkaline Phosphatase: 75 U/L (ref 39–117)
BUN: 12 mg/dL (ref 6–23)
CALCIUM: 9.3 mg/dL (ref 8.4–10.5)
CHLORIDE: 104 meq/L (ref 96–112)
CO2: 30 mEq/L (ref 19–32)
Creatinine, Ser: 0.66 mg/dL (ref 0.40–1.20)
GFR: 96.63 mL/min (ref >60.00)
GLUCOSE: 91 mg/dL (ref 70–99)
POTASSIUM: 4 meq/L (ref 3.5–5.1)
Sodium: 140 mEq/L (ref 135–145)
Total Bilirubin: 0.4 mg/dL (ref 0.2–1.2)
Total Protein: 6.7 g/dL (ref 6.0–8.3)

## 2015-06-05 LAB — FERRITIN: FERRITIN: 148.1 ng/mL (ref 10.0–291.0)

## 2015-06-05 LAB — VITAMIN B12: Vitamin B-12: 278 pg/mL (ref 211–911)

## 2015-06-05 LAB — VITAMIN D 25 HYDROXY (VIT D DEFICIENCY, FRACTURES): VITD: 24.91 ng/mL — ABNORMAL LOW (ref 30.00–100.00)

## 2015-06-05 LAB — FOLATE: FOLATE: 15.8 ng/mL (ref >5.9)

## 2015-06-08 ENCOUNTER — Other Ambulatory Visit: Payer: Self-pay | Admitting: Family Medicine

## 2015-06-08 LAB — VITAMIN B1: VITAMIN B1 (THIAMINE): 11 nmol/L (ref 8–30)

## 2015-06-08 MED ORDER — CHOLECALCIFEROL 1.25 MG (50000 UT) PO CAPS: 50000.0000 [IU] | ORAL_CAPSULE | ORAL | Status: DC

## 2015-09-10 ENCOUNTER — Ambulatory Visit: Payer: BC Managed Care – PPO | Admitting: Family Medicine

## 2015-10-02 ENCOUNTER — Telehealth: Payer: Self-pay | Admitting: *Deleted

## 2015-10-02 DIAGNOSIS — R911 Solitary pulmonary nodule: Secondary | ICD-10-CM

## 2015-10-02 NOTE — Telephone Encounter (Signed)
CT chest due in December. Called made pt aware. Order placed. Nothing further needed

## 2015-10-02 NOTE — Telephone Encounter (Signed)
-----   Message from Tanda Rockers, MD sent at 12/14/2014  9:53 AM EST ----- CT no contrast f/u spn

## 2015-10-07 ENCOUNTER — Other Ambulatory Visit: Payer: Self-pay | Admitting: Family Medicine

## 2015-11-07 ENCOUNTER — Inpatient Hospital Stay: Admission: RE | Admit: 2015-11-07 | Payer: BC Managed Care – PPO | Source: Ambulatory Visit

## 2015-11-13 ENCOUNTER — Telehealth: Payer: Self-pay | Admitting: Internal Medicine

## 2015-11-13 LAB — HM DEXA SCAN

## 2015-11-13 NOTE — Telephone Encounter (Signed)
Called and spoke with pt Pt states that she has a f/u CT chest scheduled 11/27/2015 Pt stated she received a appeal package yesterday from [facility] stating that CT was denied unless appeal was placed Pt wanted to know if office had received same package and what the next steps were going forward to get her CT approved Pt stated that if she has to pay out of pocket she would cancel study   Informed pt that i would send a message to office [facility] to see if paperwork had been received

## 2015-11-14 NOTE — Telephone Encounter (Signed)
Spoke to pt that paperwork was old we did get it authorized and pt was given the auth# Joellen [place]

## 2015-11-27 ENCOUNTER — Ambulatory Visit (INDEPENDENT_AMBULATORY_CARE_PROVIDER_SITE_OTHER)
Admission: RE | Admit: 2015-11-27 | Discharge: 2015-11-27 | Disposition: A | Discharge status: Home / Self Care | Payer: BC Managed Care – PPO | Source: Ambulatory Visit | Attending: Internal Medicine | Admitting: Internal Medicine

## 2015-11-27 DIAGNOSIS — R911 Solitary pulmonary nodule: Secondary | ICD-10-CM

## 2015-11-28 ENCOUNTER — Telehealth: Payer: Self-pay | Admitting: Internal Medicine

## 2015-11-28 NOTE — Telephone Encounter (Signed)
Patient notified of CT results.  Nothing further needed. 

## 2015-12-10 ENCOUNTER — Ambulatory Visit (INDEPENDENT_AMBULATORY_CARE_PROVIDER_SITE_OTHER): Payer: BC Managed Care – PPO | Admitting: Family Medicine

## 2015-12-10 ENCOUNTER — Encounter: Payer: Self-pay | Admitting: Family Medicine

## 2015-12-10 VITALS — BP 120/70 | HR 64 | Temp 98.1°F | Wt 203.0 lb

## 2015-12-10 DIAGNOSIS — G4700 Insomnia, unspecified: Secondary | ICD-10-CM

## 2015-12-10 DIAGNOSIS — Z1211 Encounter for screening for malignant neoplasm of colon: Secondary | ICD-10-CM

## 2015-12-10 DIAGNOSIS — Z9884 Bariatric surgery status: Secondary | ICD-10-CM | POA: Diagnosis not present

## 2015-12-10 DIAGNOSIS — E785 Hyperlipidemia, unspecified: Secondary | ICD-10-CM

## 2015-12-10 DIAGNOSIS — Z23 Encounter for immunization: Secondary | ICD-10-CM | POA: Diagnosis not present

## 2015-12-10 DIAGNOSIS — Z20828 Contact with and (suspected) exposure to other viral communicable diseases: Secondary | ICD-10-CM

## 2015-12-10 LAB — COMPREHENSIVE METABOLIC PANEL
ALBUMIN: 3.9 g/dL (ref 3.5–5.2)
ALT: 21 U/L (ref 0–35)
AST: 19 U/L (ref 0–37)
Alkaline Phosphatase: 76 U/L (ref 39–117)
BILIRUBIN TOTAL: 0.5 mg/dL (ref 0.2–1.2)
BUN: 14 mg/dL (ref 6–23)
CO2: 30 mEq/L (ref 19–32)
Calcium: 9.1 mg/dL (ref 8.4–10.5)
Chloride: 104 mEq/L (ref 96–112)
Creatinine, Ser: 0.61 mg/dL (ref 0.40–1.20)
GFR: 105.65 mL/min (ref >60.00)
GLUCOSE: 86 mg/dL (ref 70–99)
Potassium: 3.9 mEq/L (ref 3.5–5.1)
Sodium: 139 mEq/L (ref 135–145)
TOTAL PROTEIN: 6.4 g/dL (ref 6.0–8.3)

## 2015-12-10 LAB — CBC
HCT: 39.5 % (ref 36.0–46.0)
Hemoglobin: 12.9 g/dL (ref 12.0–15.0)
MCHC: 32.6 g/dL (ref 30.0–36.0)
MCV: 94 fl (ref 78.0–100.0)
Platelets: 196 10*3/uL (ref 150.0–400.0)
RBC: 4.2 Mil/uL (ref 3.87–5.11)
RDW: 12.8 % (ref 11.5–15.5)
WBC: 6.5 10*3/uL (ref 4.0–10.5)

## 2015-12-10 LAB — FERRITIN: Ferritin: 137.7 ng/mL (ref 10.0–291.0)

## 2015-12-10 LAB — VITAMIN D 25 HYDROXY (VIT D DEFICIENCY, FRACTURES): VITD: 28.37 ng/mL — AB (ref 30.00–100.00)

## 2015-12-10 LAB — LDL CHOLESTEROL, DIRECT: LDL DIRECT: 84 mg/dL

## 2015-12-10 LAB — VITAMIN B12: Vitamin B-12: 483 pg/mL (ref 211–911)

## 2015-12-10 MED ORDER — TEMAZEPAM 15 MG PO CAPS: 15.0000 mg | ORAL_CAPSULE | Freq: Every evening | ORAL | Status: DC | PRN

## 2015-12-10 MED ORDER — CHOLECALCIFEROL 1.25 MG (50000 UT) PO CAPS: 50000.0000 [IU] | ORAL_CAPSULE | ORAL | Status: DC

## 2015-12-10 NOTE — Addendum Note (Signed)
Addended by: Marin Olp on: 12/10/2015 05:25 PM   Modules accepted: Orders

## 2015-12-10 NOTE — Addendum Note (Signed)
Addended by: Clyde Lundborg A on: 12/10/2015 10:04 AM   Modules accepted: Orders

## 2015-12-10 NOTE — Patient Instructions (Addendum)
[facility] will call you regarding colonoscopy.  Have mammogram results faxed to [place] at [phone]. Sign release of information at the front desk for this if you want.   Labs before you go- if normal will space to a year or just do abnormals in 6 months.   Health Maintenance Due  Topic Date Due  . Hepatitis C Screening - today 1956-02-09  . HIV Screening - declined as low risk 01/01/1969  . TETANUS/TDAP - today 06/06/2013

## 2015-12-10 NOTE — Progress Notes (Signed)
[name], MD  Subjective:  Alexis Wallace is a 60 y.o. year old very pleasant female patient who presents for/with See problem oriented charting ROS- No chest pain or shortness of breath. No headache or blurry vision. Losing weight but intended  Past Medical History-  Patient Active Problem List   Diagnosis Date Noted  . Rosacea 08/30/2014    Priority: Medium  . Insomnia 08/30/2014    Priority: Medium  . Bariatric surgery status 09/01/2010    Priority: Medium  . 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 Plantar fasciitis, right 12/13/2014    Priority: Low  . Solitary pulmonary nodule 12/05/2014    Priority: Low  . Family history of hypertrophic cardiomyopathy 05/13/2011    Priority: Low  . Actinic keratosis 10/22/2010    Priority: Low  . 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 ALLERGIC RHINITIS 03/01/2008    Priority: Low  . ANEMIA-IRON DEFICIENCY 11/21/2007    Priority: Low  . ANEMIA, B12 DEFICIENCY 08/15/2007    Priority: Low  . GERD 06/02/2007    Priority: Low  . PSORIASIS 06/02/2007    Priority: Low  . OSTEOARTHRITIS 06/02/2007    Priority: Low  . OSTEOPENIA 06/02/2007    Priority: Low  . CAP (community acquired pneumonia) 11/18/2014    Medications- reviewed and updated Current Outpatient Prescriptions  Medication Sig Dispense Refill  . calcium carbonate 200 MG capsule Take 3,000 mg by mouth daily.    . Cholecalciferol 50000 UNITS capsule Take 1 capsule (50,000 Units total) by mouth once a week. 8 capsule 0  . estradiol (ESTRACE) 2 MG tablet Take 1 mg by mouth daily.     . medroxyPROGESTERone (PROVERA) 10 MG tablet Take 10 mg by mouth daily.    . Multiple Vitamin (MULTIVITAMIN) capsule Take 1 capsule by mouth daily.      Marland Kitchen omeprazole (PRILOSEC) 20 MG capsule TAKE ONE CAPSULE BY MOUTH TWICE A DAY 60 capsule 5  . temazepam (RESTORIL) 15 MG capsule Take 1 capsule (15 mg total) by mouth at bedtime as  needed for sleep. 30 capsule 5   Current Facility-Administered Medications  Medication Dose Route Frequency Provider Last Rate Last Dose  . triamcinolone acetonide (KENALOG) 10 MG/ML injection 10 mg  10 mg Other Once Trula Slade, DPM        Objective: BP 120/70 mmHg  Pulse 64  Temp(Src) 98.1 F (36.7 C)  Wt 203 lb (92.08 kg) Gen: NAD, resting comfortably CV: RRR no murmurs rubs or gallops Lungs: CTAB no crackles, wheeze, rhonchi Abdomen: soft/nontender/nondistended/normal bowel sounds. No rebound or guarding.  Ext: no edema Skin: warm, dry Neuro: grossly normal, moves all extremities   Assessment/Plan:  Bariatric surgery status S: surgery 2001. Recently has lost another 11 lbs through weight watchers and walking during breaks at work [employer] from Last 3 Encounters:  12/10/15 203 lb (92.08 kg)  06/04/15 214 lb (97.07 kg)  02/12/15 225 lb (102.059 kg)  A/P: doing well- encouraged continued healthy habits. Check Bariatric labs- cmet, cbc, iron and tibc, ferritin, vitamin b12, vit D, PTH, b1, folate, direct ldl   Insomnia S: mainly using temazepam at least once a week but at times more. Always uses with travel. Controlled when using A/P: refill temazepam doing well  Hyperlipidemia S: mild with last LDL 104 but below threshold for treatment. Has lost 22 lbs in last 10 months through diet/exercise A/P: repeat direct LDL today-  likely has improved  6 months See avs- bariatric labs yearly or 6 months only for abnormals after this Return precautions advised.   Orders Placed This Encounter  Procedures  . Comprehensive metabolic panel    [facility]  . CBC    [facility]  . Iron and TIBC  . Ferritin  . Vitamin B12  . VITAMIN D 25 Hydroxy (Vit-D Deficiency, Fractures)    [facility]  . PTH, Intact and Calcium  . Vitamin B1  . Folate RBC  . LDL cholesterol, direct      . Hepatitis C antibody, reflex    [facility]  . Ambulatory referral to Gastroenterology     Referral Priority:  Routine    Referral Type:  Consultation    Referral Reason:  Specialty Services Required    Number of Visits Requested:  1    Meds ordered this encounter  . temazepam (RESTORIL) 15 MG capsule    Sig: Take 1 capsule (15 mg total) by mouth at bedtime as needed for sleep.    Dispense:  30 capsule    Refill:  5

## 2015-12-10 NOTE — Assessment & Plan Note (Signed)
S: surgery 2001. Recently has lost another 11 lbs through weight watchers and walking during breaks at work [employer] from Last 3 Encounters:  12/10/15 203 lb (92.08 kg)  06/04/15 214 lb (97.07 kg)  02/12/15 225 lb (102.059 kg)  A/P: doing well- encouraged continued healthy habits. Check Bariatric labs- cmet, cbc, iron and tibc, ferritin, vitamin b12, vit D, PTH, b1, folate, direct ldl

## 2015-12-10 NOTE — Assessment & Plan Note (Signed)
S: mainly using temazepam at least once a week but at times more. Always uses with travel. Controlled when using A/P: refill temazepam doing well

## 2015-12-11 LAB — HEPATITIS C ANTIBODY: HCV AB: NEGATIVE

## 2015-12-11 LAB — PTH, INTACT AND CALCIUM
CALCIUM: 8.7 mg/dL (ref 8.4–10.5)
PTH: 66 pg/mL — ABNORMAL HIGH (ref 14–64)

## 2015-12-11 LAB — IRON AND TIBC
%SAT: 35 % (ref 11–50)
IRON: 91 ug/dL (ref 45–160)
TIBC: 263 ug/dL (ref 250–450)
UIBC: 172 ug/dL (ref 125–400)

## 2015-12-12 LAB — FOLATE RBC: RBC Folate: 594 ng/mL (ref >280)

## 2015-12-16 LAB — VITAMIN B1: Vitamin B1 (Thiamine): 12 nmol/L (ref 8–30)

## 2015-12-19 ENCOUNTER — Telehealth: Payer: Self-pay | Admitting: Family Medicine

## 2015-12-19 ENCOUNTER — Other Ambulatory Visit: Payer: Self-pay | Admitting: Family Medicine

## 2015-12-19 NOTE — Telephone Encounter (Signed)
Didn't see this mentioned in the last OV note, is this ok?

## 2015-12-19 NOTE — Telephone Encounter (Signed)
Called and lm for pt tcb and leave urinary symptoms so I can place order for Urine Culture and UA.

## 2015-12-19 NOTE — Telephone Encounter (Signed)
Patient called to advise that the UTI discussed at the previous appt has come back. She states that she was told to notify us if this happened, and we would do a UA. She is requesting for the order to be placed. Please advise.

## 2015-12-19 NOTE — Telephone Encounter (Signed)
I do not recall either. What symptoms is she having? I am ok with her dropping off for UA and culture.

## 2015-12-20 ENCOUNTER — Other Ambulatory Visit (INDEPENDENT_AMBULATORY_CARE_PROVIDER_SITE_OTHER): Payer: BC Managed Care – PPO

## 2015-12-20 ENCOUNTER — Other Ambulatory Visit: Payer: Self-pay | Admitting: Family Medicine

## 2015-12-20 DIAGNOSIS — R3915 Urgency of urination: Secondary | ICD-10-CM | POA: Diagnosis not present

## 2015-12-20 DIAGNOSIS — R8290 Unspecified abnormal findings in urine: Secondary | ICD-10-CM

## 2015-12-20 NOTE — Telephone Encounter (Signed)
Pt states she is having urgency have and foul smelling odor with her urine. Pt will be by this afternoon to drop of urine sample.

## 2015-12-23 ENCOUNTER — Other Ambulatory Visit: Payer: Self-pay | Admitting: Family Medicine

## 2015-12-23 LAB — URINE CULTURE: Colony Count: >=100000

## 2015-12-23 MED ORDER — NITROFURANTOIN MONOHYD MACRO 100 MG PO CAPS: 100.0000 mg | ORAL_CAPSULE | Freq: Two times a day (BID) | ORAL | Status: DC

## 2016-01-13 IMAGING — CR DG CHEST 2V
2 series · 2 of 2 positions shown · non-contrast
Comparison: Chest radiograph 02/11/2007.

CLINICAL DATA: Cough with low-grade fever. Recent pneumonia and
completed antibiotics. Symptoms persist.

EXAM:
CHEST  2 VIEW

[view not recorded (1 of 2)]
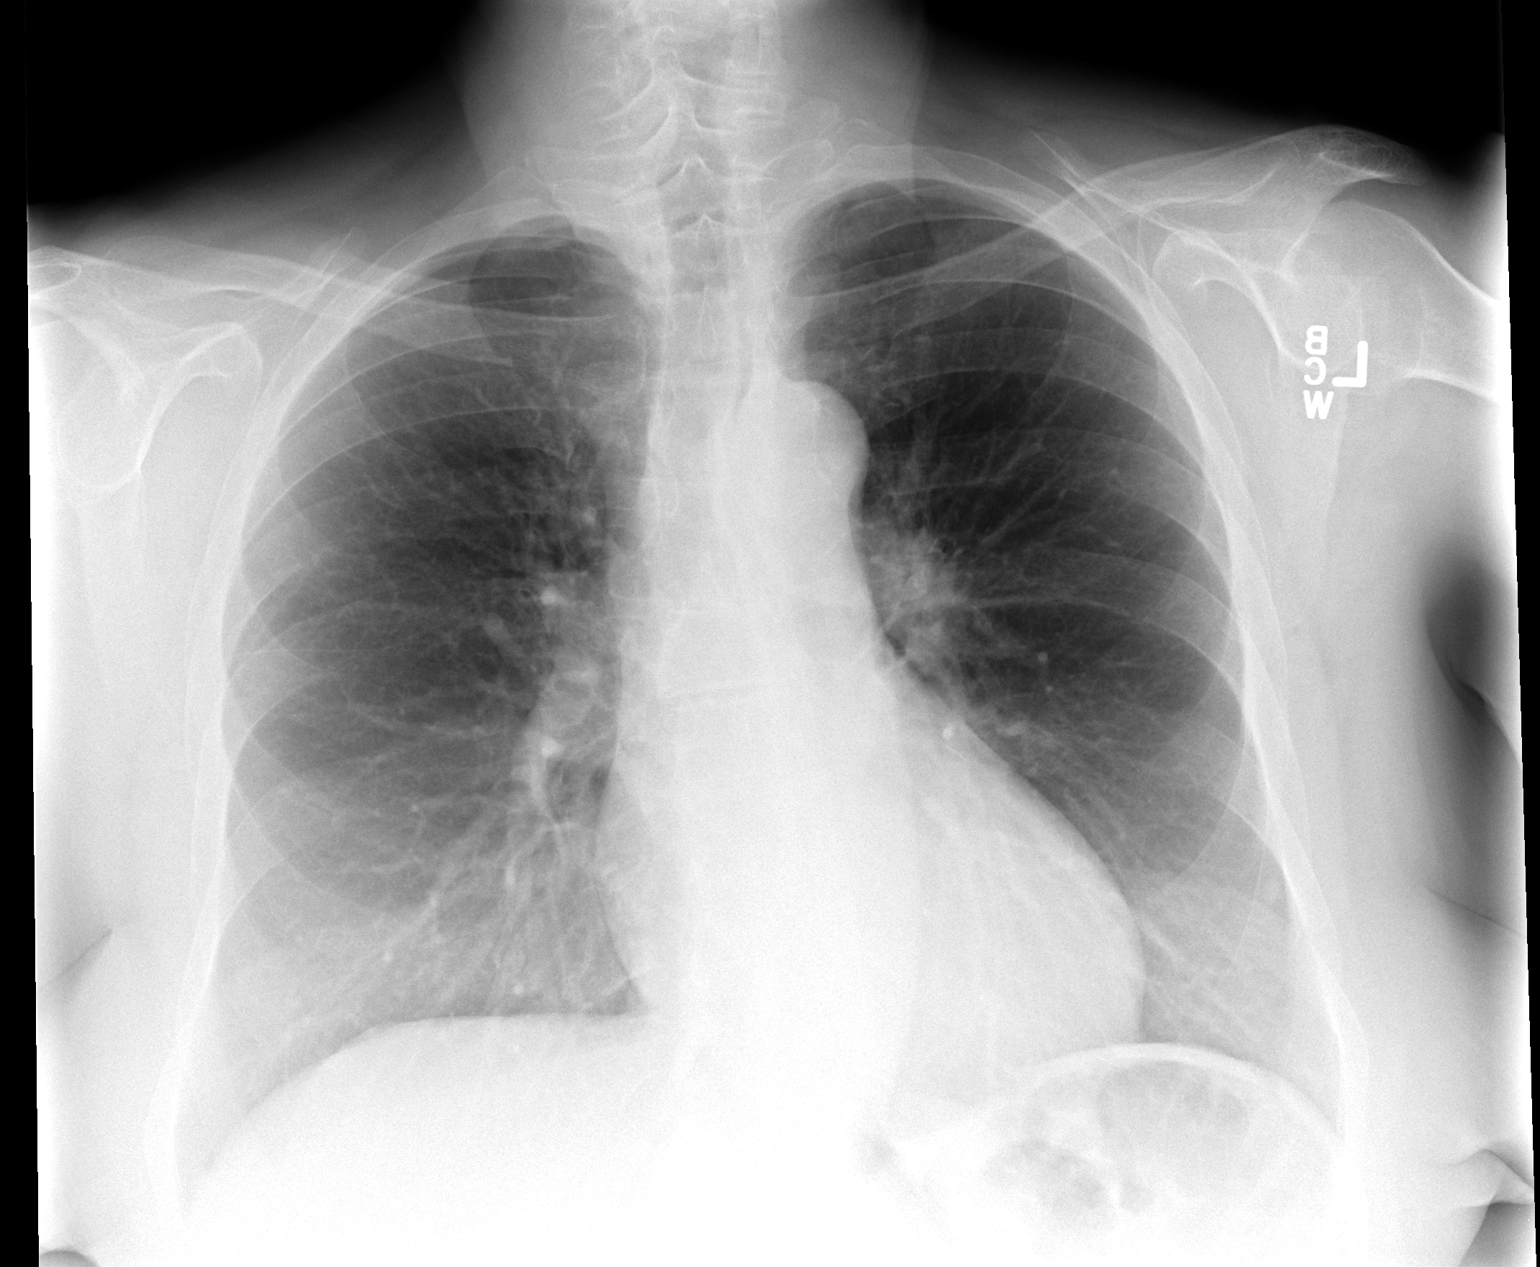

[view not recorded (2 of 2)]
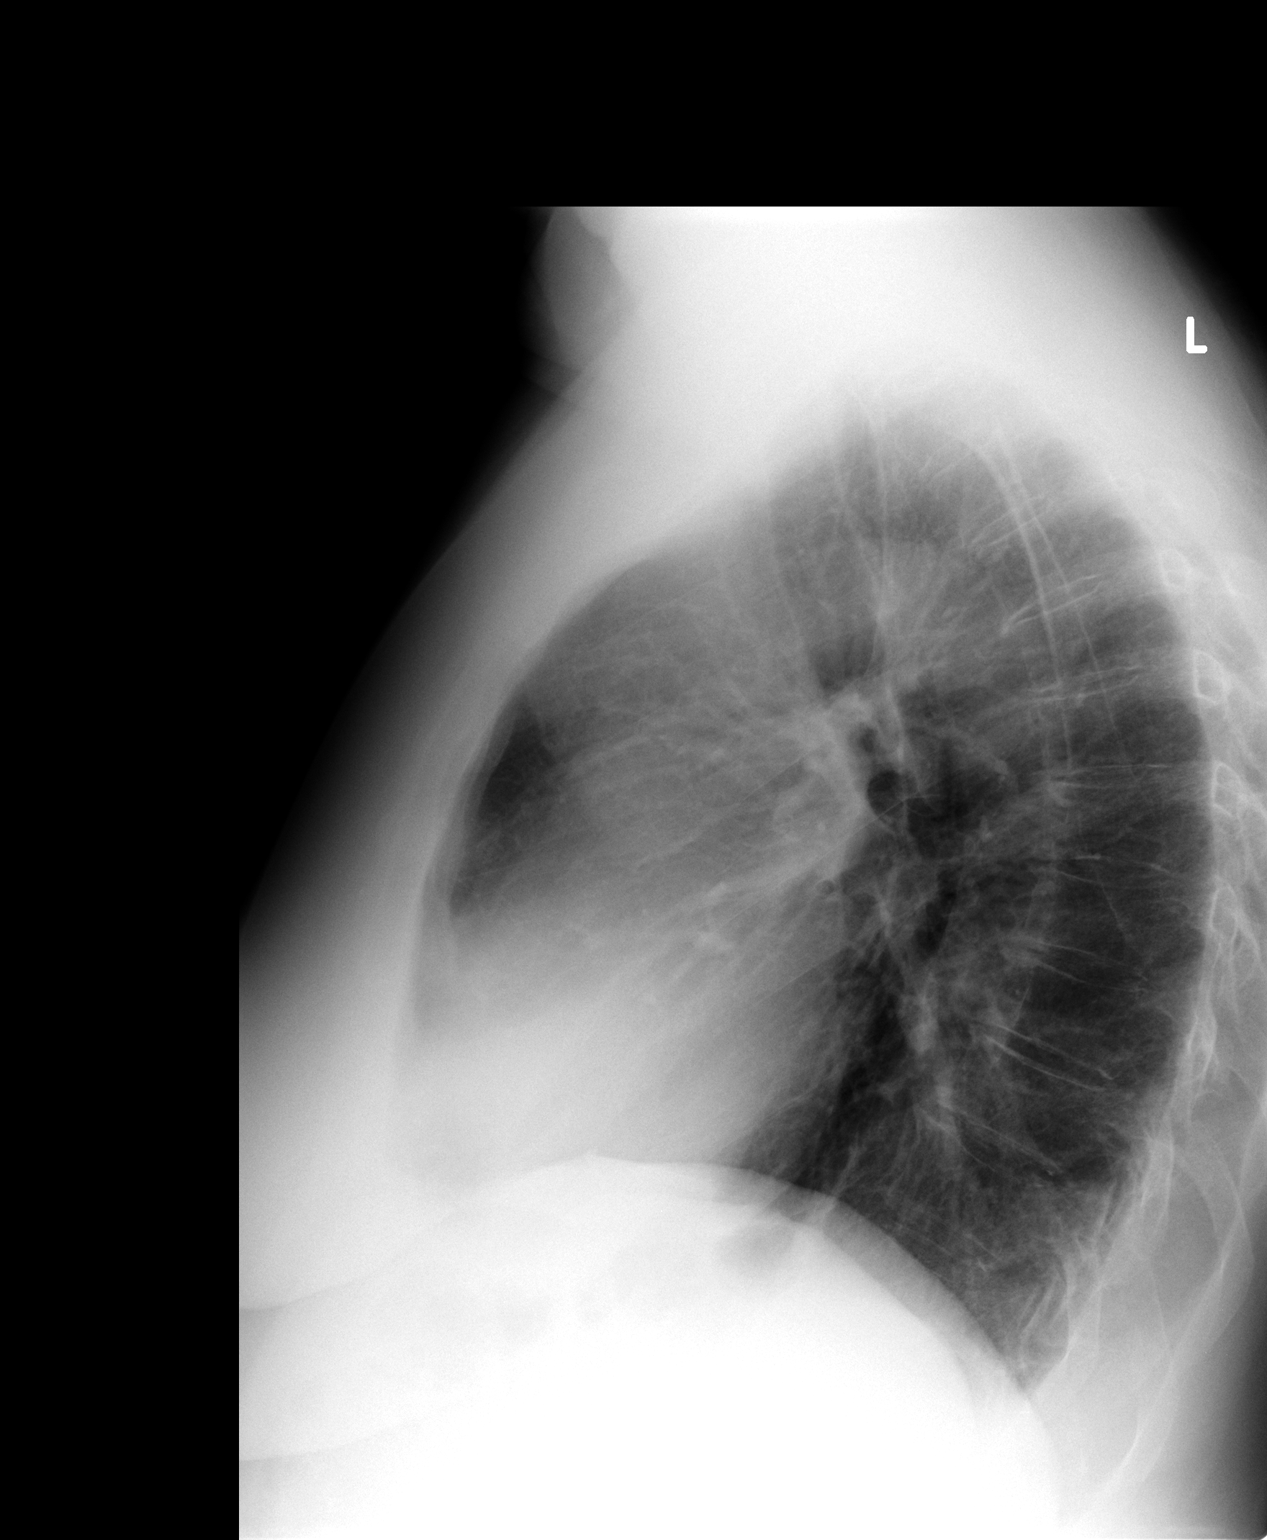

[2 of 2 positions shown; findings below may reference images not displayed]

FINDINGS: There is an 8 mm oval-shaped density in the left lower chest.
Findings could be related to a pulmonary nodule. This area was not
imaged on the previous examination. Heart size is normal. No focal
airspace disease or pulmonary edema. Trachea is midline. Negative
for pleural effusions.
IMPRESSION: 8 mm nodular density in the left lower chest. Recommend further
evaluation with a chest CT to exclude a pulmonary nodule.

No evidence for airspace disease.

These results will be called to the ordering clinician or
representative by the Radiologist Assistant, and communication
documented in the PACS or zVision Dashboard.

## 2016-01-18 IMAGING — CT CT CHEST W/O CM
2 of 4 series · 15 of 36 positions shown, 18 images · non-contrast
Comparison: Chest radiograph November 29, 2014

CLINICAL DATA: Pulmonary nodule on chest radiograph

EXAM:
CT CHEST WITHOUT CONTRAST
TECHNIQUE: Multidetector CT imaging of the chest was performed following the
standard protocol without IV contrast material administration.

[Series 2: thorax 5.0 i31f 1 · axial · 0.76mm/px · z∈[-311,-61]mm · 12 of 60 slices shown, 15 images]
[im 5/60  mediastinal]
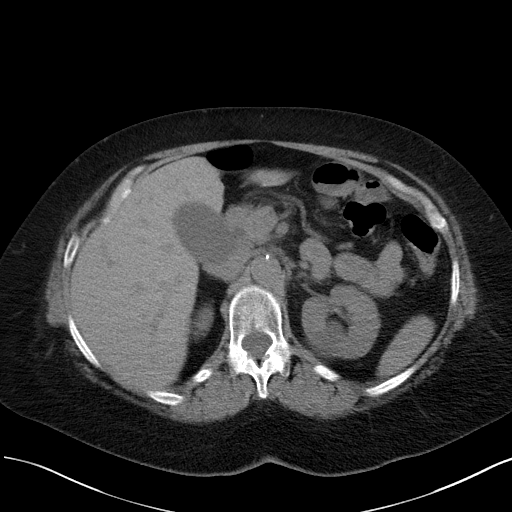
[im 5/60  lung]
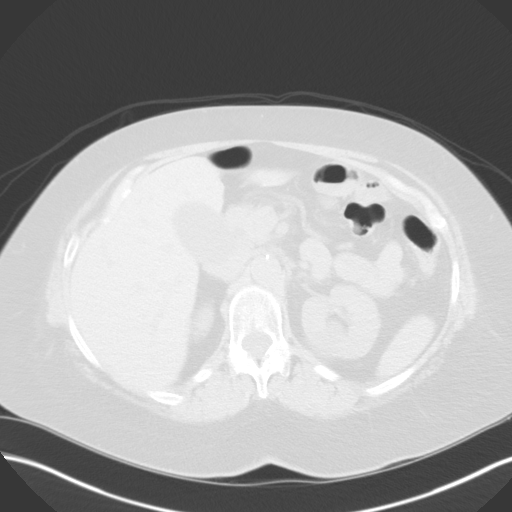
[im 9/60  lung]
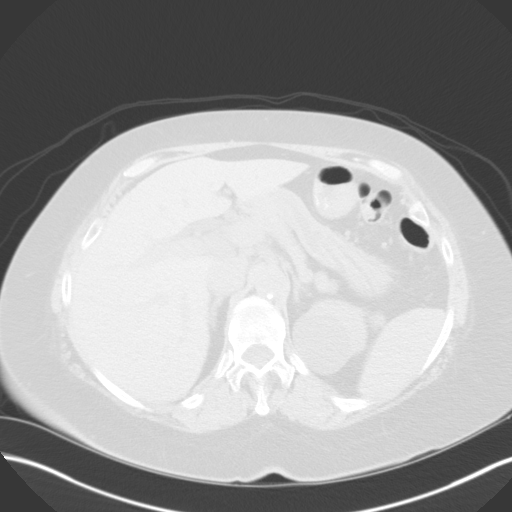
[im 13/60  lung]
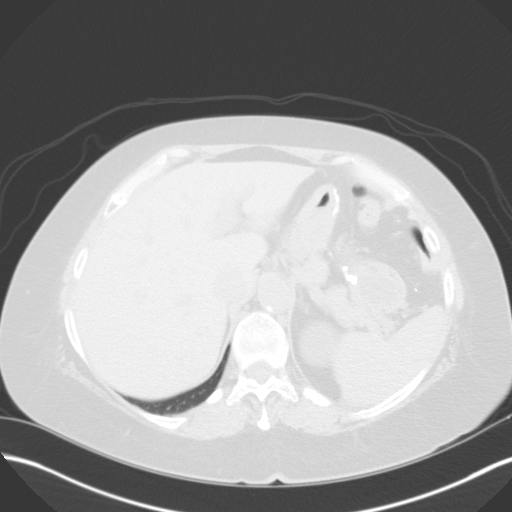
[im 17/60  lung]
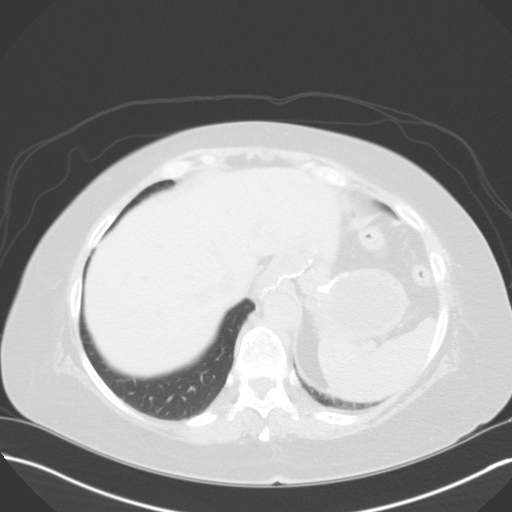
[im 22/60  mediastinal]
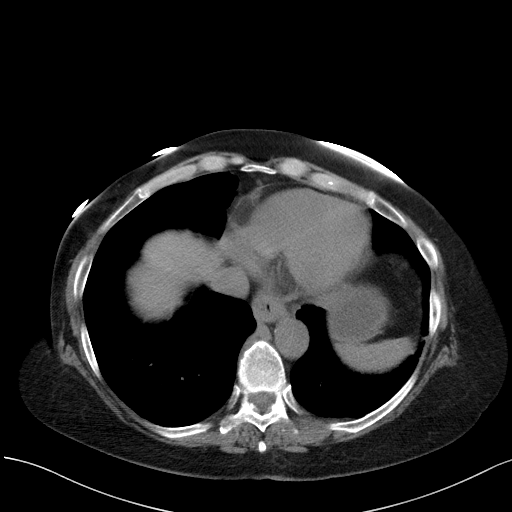
[im 22/60  lung]
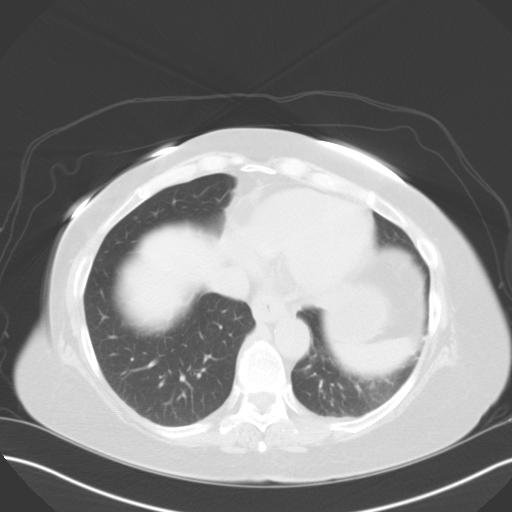
[im 26/60  lung]
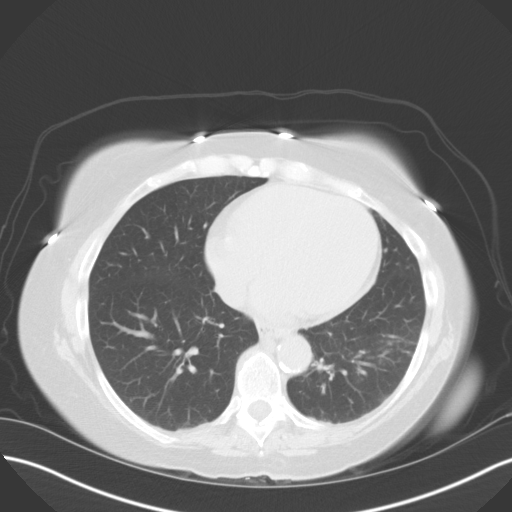
[im 34/60  lung]
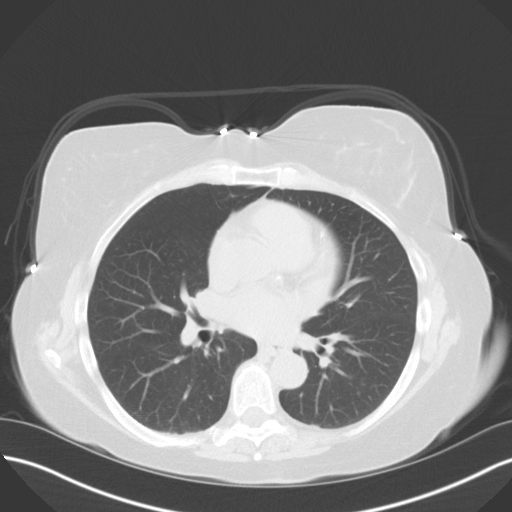
[im 38/60  lung]
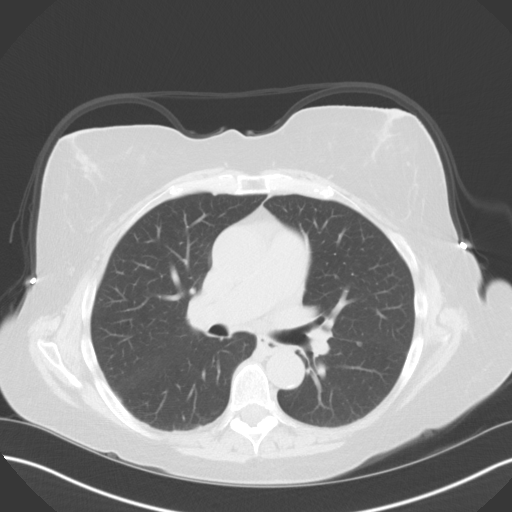
[im 43/60  mediastinal]
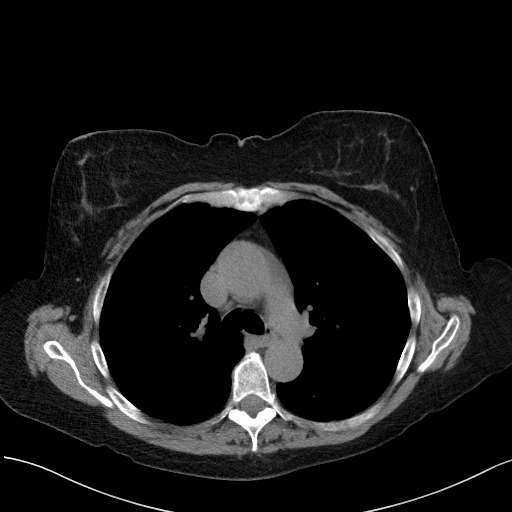
[im 43/60  lung]
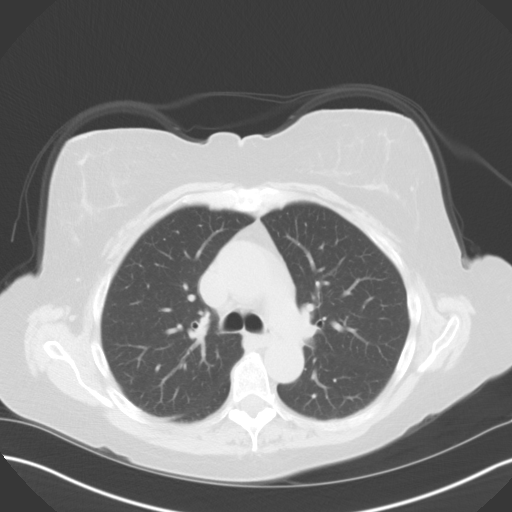
[im 47/60  lung]
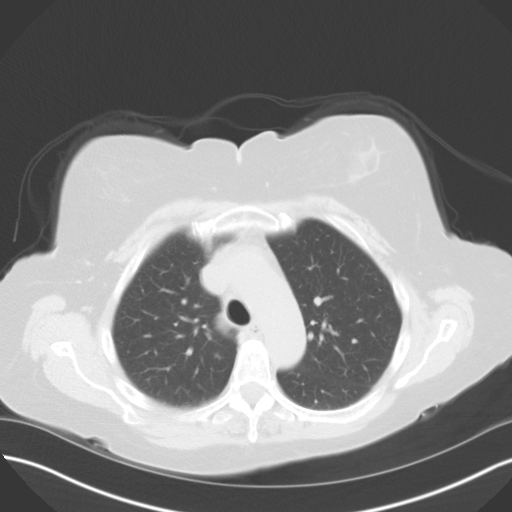
[im 51/60  lung]
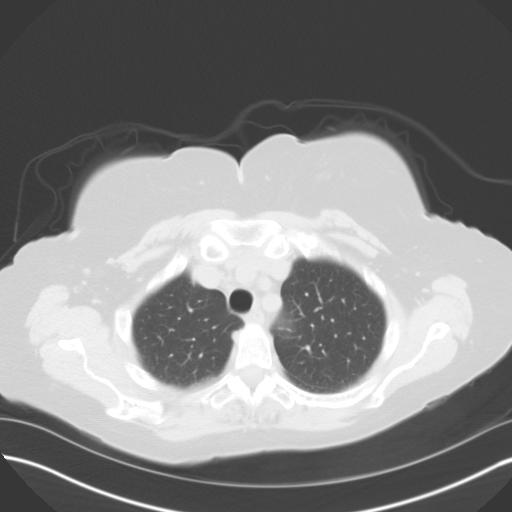
[im 55/60  lung]
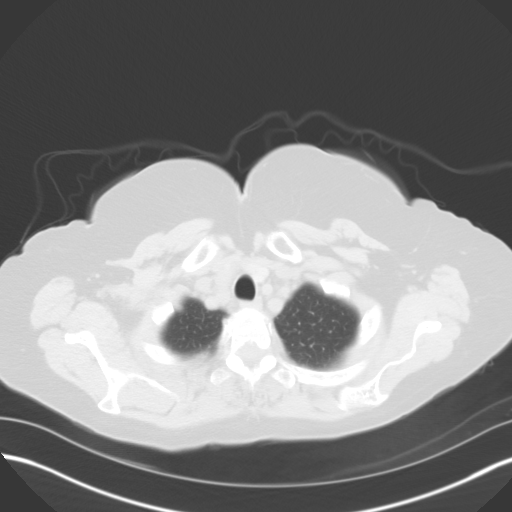

[Series 5: coronal · coronal · 0.65mm/px · 3 of 80 slices shown]
[im 16/80  lung]
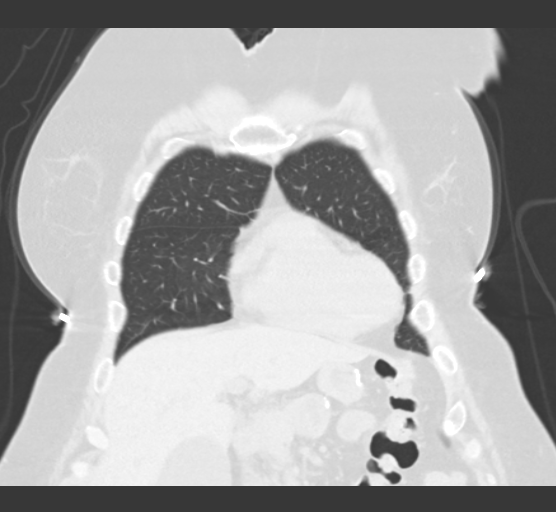
[im 32/80  lung]
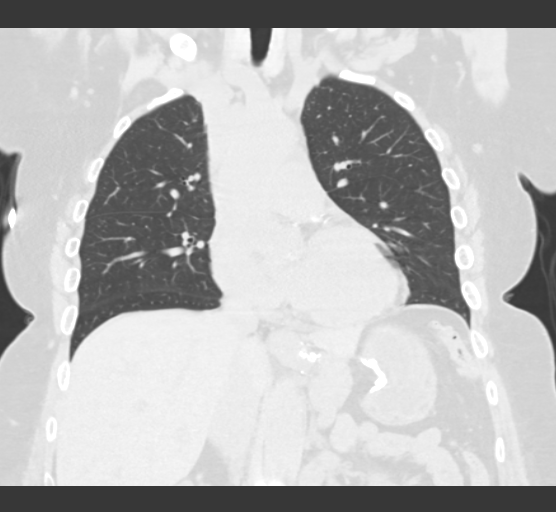
[im 48/80  lung]
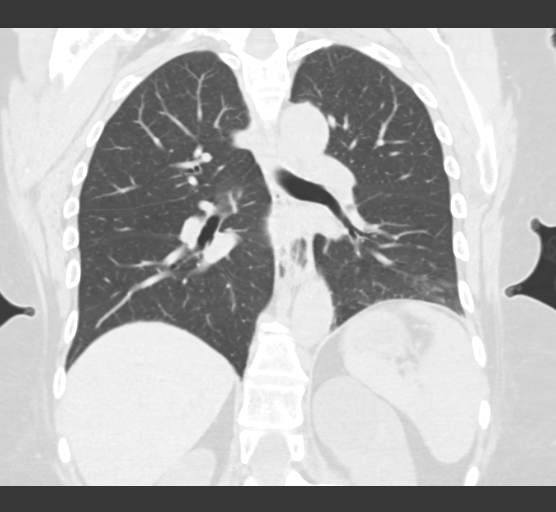

[15 of 36 positions shown; findings below may reference images not displayed]

FINDINGS: There is patchy atelectasis in the lateral segment of the left lower
lobe. There is no edema or consolidation. There is no apparent
pulmonary nodular opacity corresponding to the lesions seen on the
chest radiograph. However, there is a 3 mm pulmonary nodular opacity
in the superior segment of the left lower lobe seen on axial slice
27 series 3. Lungs elsewhere are clear.

There is no appreciable thoracic adenopathy. Pericardium is not
thickened. There are scattered foci of coronary artery
calcification. There is no thoracic aortic aneurysm or dissection.
There is no pulmonary embolus appreciable on this noncontrast
enhanced study.

There is a small hiatal hernia. There is postoperative change at the
gastroesophageal junction.

There is a cyst arising from the upper pole left kidney measuring
4.7 x 4.5 cm. Adrenals appear normal bilaterally.

Visualized thyroid appears normal. There are no blastic or lytic
bone lesions.
IMPRESSION: Atelectatic change lateral left base. 3 mm nodular opacity in the
superior segment left lower lobe. Followup of this nodular opacity
should be based on [HOSPITAL] guidelines. If the patient is
at high risk for bronchogenic carcinoma, follow-up chest CT at 1
year is recommended. If the patient is at low risk, no follow-up is
needed. This recommendation follows the consensus statement:
Guidelines for Management of Small Pulmonary Nodules Detected on CT
Scans: A Statement from the [HOSPITAL] as published in

Scattered foci of coronary artery calcification. Small hiatal
hernia. Cyst upper pole left kidney. No appreciable thoracic
adenopathy.

## 2016-01-22 ENCOUNTER — Telehealth: Payer: Self-pay | Admitting: Family Medicine

## 2016-01-22 NOTE — Telephone Encounter (Signed)
Pt call today and said she think her UTI is returning. She said Dr Yong Channel told her if she felt like it was returning to give him a call

## 2016-01-22 NOTE — Telephone Encounter (Signed)
Pt will need to be seen to be evaluated.

## 2016-01-23 NOTE — Telephone Encounter (Signed)
Pt sched for 01/28/16 and I had to use a sda 1pm

## 2016-01-28 ENCOUNTER — Encounter: Payer: Self-pay | Admitting: Family Medicine

## 2016-01-28 ENCOUNTER — Ambulatory Visit (INDEPENDENT_AMBULATORY_CARE_PROVIDER_SITE_OTHER): Payer: BC Managed Care – PPO | Admitting: Family Medicine

## 2016-01-28 VITALS — BP 130/80 | HR 58 | Temp 97.7°F | Wt 201.0 lb

## 2016-01-28 DIAGNOSIS — N3001 Acute cystitis with hematuria: Secondary | ICD-10-CM

## 2016-01-28 DIAGNOSIS — R3915 Urgency of urination: Secondary | ICD-10-CM

## 2016-01-28 DIAGNOSIS — R8290 Unspecified abnormal findings in urine: Secondary | ICD-10-CM

## 2016-01-28 LAB — POC URINALSYSI DIPSTICK (AUTOMATED)
BILIRUBIN UA: NEGATIVE
Glucose, UA: NEGATIVE
Ketones, UA: NEGATIVE
LEUKOCYTES UA: NEGATIVE
Nitrite, UA: NEGATIVE
Protein, UA: NEGATIVE
SPEC GRAV UA: 1.02
Urobilinogen, UA: 0.2
pH, UA: 6

## 2016-01-28 MED ORDER — CEPHALEXIN 500 MG PO CAPS: 500.0000 mg | ORAL_CAPSULE | Freq: Four times a day (QID) | ORAL | Status: DC

## 2016-01-28 NOTE — Progress Notes (Signed)
[name], MD  Subjective:  Alexis Wallace is a 60 y.o. year old very pleasant female patient who presents for/with See problem oriented charting ROS- no fever, chills, nausea, vomiting.   Past Medical History-  Patient Active Problem List   Diagnosis Date Noted  . Rosacea 08/30/2014    Priority: Medium  . Insomnia 08/30/2014    Priority: Medium  . Bariatric surgery status 09/01/2010    Priority: Medium  . 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 Plantar fasciitis, right 12/13/2014    Priority: Low  . Solitary pulmonary nodule 12/05/2014    Priority: Low  . Family history of hypertrophic cardiomyopathy 05/13/2011    Priority: Low  . Actinic keratosis 10/22/2010    Priority: Low  . 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 ALLERGIC RHINITIS 03/01/2008    Priority: Low  . ANEMIA-IRON DEFICIENCY 11/21/2007    Priority: Low  . ANEMIA, B12 DEFICIENCY 08/15/2007    Priority: Low  . GERD 06/02/2007    Priority: Low  . PSORIASIS 06/02/2007    Priority: Low  . OSTEOARTHRITIS 06/02/2007    Priority: Low  . OSTEOPENIA 06/02/2007    Priority: Low  . CAP (community acquired pneumonia) 11/18/2014    Medications- reviewed and updated Current Outpatient Prescriptions  Medication Sig Dispense Refill  . calcium carbonate 200 MG capsule Take 3,000 mg by mouth daily.    . Cholecalciferol 50000 units capsule Take 1 capsule (50,000 Units total) by mouth once a week. 12 capsule 0  . estradiol (ESTRACE) 2 MG tablet Take 1 mg by mouth daily.     . medroxyPROGESTERone (PROVERA) 10 MG tablet Take 10 mg by mouth daily.    . Multiple Vitamin (MULTIVITAMIN) capsule Take 1 capsule by mouth daily.      . nitrofurantoin, macrocrystal-monohydrate, (MACROBID) 100 MG capsule Take 1 capsule (100 mg total) by mouth 2 (two) times daily. 14 capsule 0  . omeprazole (PRILOSEC) 20 MG capsule TAKE ONE CAPSULE BY MOUTH TWICE A DAY 60 capsule 5  .  temazepam (RESTORIL) 15 MG capsule Take 1 capsule (15 mg total) by mouth at bedtime as needed for sleep. 30 capsule 5   Current Facility-Administered Medications  Medication Dose Route Frequency Provider Last Rate Last Dose  . triamcinolone acetonide (KENALOG) 10 MG/ML injection 10 mg  10 mg Other Once Trula Slade, DPM        Objective: BP 130/80 mmHg  Pulse 58  Temp(Src) 97.7 F (36.5 C)  Wt 201 lb (91.173 kg) Gen: NAD, resting comfortably CV: RRR no murmurs rubs or gallops Lungs: CTAB no crackles, wheeze, rhonchi Abdomen: soft/nontender/nondistended/normal bowel sounds. No rebound or guarding.  No cva tenderness Ext: no edema Skin: warm, dry Neuro: grossly normal, moves all extremities  Assessment/Plan:  UTI S: In December saw gynecologist Dr. Nori Riis and treated with cipro for 5 days. Symptoms resolved completely but returned in 2 weeks- smell and urgency returned and called the PA for gynecology- cultured and grew unknown bacteria and prescribed nitrofurantoin. Patient was treated for UTI approximately a month ago. She was treated with nitrofurantoin for 7 days for E. Coli. Developed urinary frequency and urgency for at least 2 weeks.  A/P: Symptomatically with potentially recurrent UTI. UA not overly impressive. ? Never cleared. We will treat with keflex 7 days and get urine culture today and  in 14 days. Patient interested in prophylaxis which we could consider and would  use this urine culture, and previous to guide choice of antibiotic. With recurrent irritative symptoms, could also consider urology input.   Sooner Return precautions advised.   Results for orders placed or performed in visit on 01/28/16 (from the past 24 hour(s))  POCT Urinalysis Dipstick (Automated)     Status: None   Collection Time: 01/28/16  1:16 PM  Result Value Ref Range   Color, UA yellow    Clarity, UA clear    Glucose, UA n    Bilirubin, UA n    Ketones, UA n    Spec Grav, UA 1.020    Blood,  UA trace-intact    pH, UA 6.0    Protein, UA n    Urobilinogen, UA 0.2    Nitrite, UA n    Leukocytes, UA Negative Negative

## 2016-01-28 NOTE — Patient Instructions (Addendum)
UTI it sounds like Treat with keflex for 7 days  Let's actually have you in 2 weeks drop off a urine for culture to see if it has completely cleared  Get urine culture today to make sure you are on the right antibiotic  Send me a message about the 185 goal and I will send letter back to you

## 2016-01-31 LAB — URINE CULTURE: Colony Count: >=100000

## 2016-02-12 ENCOUNTER — Other Ambulatory Visit: Payer: Self-pay | Admitting: Family Medicine

## 2016-02-26 ENCOUNTER — Other Ambulatory Visit (INDEPENDENT_AMBULATORY_CARE_PROVIDER_SITE_OTHER): Payer: BC Managed Care – PPO

## 2016-02-26 ENCOUNTER — Telehealth: Payer: Self-pay | Admitting: Family Medicine

## 2016-02-26 ENCOUNTER — Other Ambulatory Visit: Payer: Self-pay | Admitting: Family Medicine

## 2016-02-26 DIAGNOSIS — E559 Vitamin D deficiency, unspecified: Secondary | ICD-10-CM

## 2016-02-26 DIAGNOSIS — N3001 Acute cystitis with hematuria: Secondary | ICD-10-CM

## 2016-02-26 DIAGNOSIS — N390 Urinary tract infection, site not specified: Secondary | ICD-10-CM

## 2016-02-26 NOTE — Telephone Encounter (Signed)
Dr Yong Channel started patient on Vitamin D and she wanted to know if she needed to get her Vitamin D levels checked again to see if it was helping.

## 2016-02-26 NOTE — Telephone Encounter (Signed)
Ok to order VIt D lab?

## 2016-02-27 NOTE — Telephone Encounter (Signed)
Spoke to pt, told her Dr.Hunter said will recheck Vit D level after you finish the full 12 weeks of therapy. Pt verbalized understanding and stated she just finished this week. Told her okay just need to call and schedule lab appt only and I will put order in. Pt verbalized understanding. Order put in EPIC.

## 2016-02-27 NOTE — Telephone Encounter (Signed)
After she finished the full 12 weeks- yes lets have her back for repeat Vit D. You may order under low vitamin d or vitamin d deficiency

## 2016-02-28 LAB — URINE CULTURE

## 2016-03-06 ENCOUNTER — Other Ambulatory Visit (INDEPENDENT_AMBULATORY_CARE_PROVIDER_SITE_OTHER): Payer: BC Managed Care – PPO

## 2016-03-06 DIAGNOSIS — N390 Urinary tract infection, site not specified: Secondary | ICD-10-CM

## 2016-03-06 DIAGNOSIS — E559 Vitamin D deficiency, unspecified: Secondary | ICD-10-CM

## 2016-03-06 LAB — VITAMIN D 25 HYDROXY (VIT D DEFICIENCY, FRACTURES): VITD: 37.9 ng/mL (ref 30.00–100.00)

## 2016-03-09 LAB — URINE CULTURE

## 2016-03-09 MED ORDER — AMOXICILLIN-POT CLAVULANATE 500-125 MG PO TABS: 1.0000 | ORAL_TABLET | Freq: Two times a day (BID) | ORAL | Status: DC

## 2016-04-07 ENCOUNTER — Ambulatory Visit (INDEPENDENT_AMBULATORY_CARE_PROVIDER_SITE_OTHER): Payer: BC Managed Care – PPO | Admitting: Family Medicine

## 2016-04-07 ENCOUNTER — Encounter: Payer: Self-pay | Admitting: Family Medicine

## 2016-04-07 VITALS — BP 122/80 | HR 72 | Temp 98.6°F | Wt 201.0 lb

## 2016-04-07 DIAGNOSIS — N390 Urinary tract infection, site not specified: Secondary | ICD-10-CM | POA: Insufficient documentation

## 2016-04-07 DIAGNOSIS — R3589 Other polyuria: Secondary | ICD-10-CM

## 2016-04-07 DIAGNOSIS — R358 Other polyuria: Secondary | ICD-10-CM | POA: Diagnosis not present

## 2016-04-07 NOTE — Progress Notes (Signed)
Subjective:  Alexis Wallace is a 60 y.o. year old very pleasant female patient who presents for/with See problem oriented charting ROS- no fever, chills, nausea, vomiting.   Past Medical History-  Patient Active Problem List   Diagnosis Date Noted  . Recurrent UTI 04/07/2016    Priority: High  . Rosacea 08/30/2014    Priority: Medium  . Insomnia 08/30/2014    Priority: Medium  . Bariatric surgery status 09/01/2010    Priority: Medium  . 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 Plantar fasciitis, right 12/13/2014    Priority: Low  . Solitary pulmonary nodule 12/05/2014    Priority: Low  . Family history of hypertrophic cardiomyopathy 05/13/2011    Priority: Low  . Actinic keratosis 10/22/2010    Priority: Low  . 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 ALLERGIC RHINITIS 03/01/2008    Priority: Low  . ANEMIA-IRON DEFICIENCY 11/21/2007    Priority: Low  . ANEMIA, B12 DEFICIENCY 08/15/2007    Priority: Low  . GERD 06/02/2007    Priority: Low  . PSORIASIS 06/02/2007    Priority: Low  . OSTEOARTHRITIS 06/02/2007    Priority: Low  . OSTEOPENIA 06/02/2007    Priority: Low  . CAP (community acquired pneumonia) 11/18/2014    Medications- reviewed and updated Current Outpatient Prescriptions  Medication Sig Dispense Refill  . calcium carbonate 200 MG capsule Take 3,000 mg by mouth daily.    Marland Kitchen estradiol (ESTRACE) 2 MG tablet Take 1 mg by mouth daily.     . medroxyPROGESTERone (PROVERA) 10 MG tablet Take 10 mg by mouth daily.    . Multiple Vitamin (MULTIVITAMIN) capsule Take 1 capsule by mouth daily.      Marland Kitchen omeprazole (PRILOSEC) 20 MG capsule TAKE ONE CAPSULE BY MOUTH TWICE A DAY 60 capsule 2  . temazepam (RESTORIL) 15 MG capsule Take 1 capsule (15 mg total) by mouth at bedtime as needed for sleep. (Patient not taking: Reported on 04/07/2016) 30 capsule 5   Current Facility-Administered Medications  Medication Dose Route  Frequency Provider Last Rate Last Dose  . triamcinolone acetonide (KENALOG) 10 MG/ML injection 10 mg  10 mg Other Once Trula Slade, DPM        Objective: BP 122/80 mmHg  Pulse 72  Temp(Src) 98.6 F (37 C)  Wt 201 lb (91.173 kg) Gen: NAD, resting comfortably CV: RRR no murmurs rubs or gallops Lungs: CTAB no crackles, wheeze, rhonchi Abdomen: soft/nontender/nondistended/normal bowel sounds. No rebound or guarding.  Ext: no edema Msk: mild pain with palpation paraspinous muscles Right low back Skin: warm, dry Neuro: grossly normal, moves all extremities  Assessment/Plan:  Recurrent UTI S: Patient years ago had seen Dr. Gaynelle Arabian of [facility] and diagnosed with overactive detrussor- she had been on amitriptyline 10mg  and hydroxyzine.   Since late last year, patient has had recurrent UTI symptoms and has now been treated 5x. First 2 times through gynecology that I do not have records for but know she was treated with cipro for 5 days first episode and nitrofurantoin next episode for E. Coli. My records show she has had 3 UTI here confirmed with >100k E. Coli. Has been treated with keflex, nitrofurantoin and last 14 days of augmentin. Usually within 2 weeks of stopping medicine she has recurrent symptoms and that is her issue today. She has developed Left back pain, symptoms started again with frequency and urgency since mid last week.  A/P: Appears to have another UTI- we will test urine culture again today. She fortunately has secured an appointment with [facility] for June. We had considered prophlyaxis with keflex or nitrofurantoin but will decline at this point until she ha s her evaluation.    Return precautions advised.   Orders Placed This Encounter  Procedures  . Urine culture    [facility]   The duration of face-to-face time during this visit was 15 minutes. Greater than 50% of this time was spent in counseling, explanation of diagnosis, planning of further  management, and/or coordination of care.    [name], MD

## 2016-04-07 NOTE — Patient Instructions (Signed)
Urine culture today- treat based off of available data in 2-3 days.   Very glad you have a urogynecology visit at [facility] in June, please take copy of urine cultures I gave you

## 2016-04-07 NOTE — Assessment & Plan Note (Signed)
S: Patient years ago had seen Dr. Gaynelle Arabian of [facility] and diagnosed with overactive detrussor- she had been on amitriptyline 10mg  and hydroxyzine.   Since late last year, patient has had recurrent UTI symptoms and has now been treated 5x. First 2 times through gynecology that I do not have records for but know she was treated with cipro for 5 days first episode and nitrofurantoin next episode for E. Coli. My records show she has had 3 UTI here confirmed with >100k E. Coli. Has been treated with keflex, nitrofurantoin and last 14 days of augmentin. Usually within 2 weeks of stopping medicine she has recurrent symptoms and that is her issue today. She has developed Left back pain, symptoms started again with frequency and urgency since mid last week.  A/P: Appears to have another UTI- we will test urine culture again today. She fortunately has secured an appointment with [facility] for June. We had considered prophlyaxis with keflex or nitrofurantoin but will decline at this point until she ha s her evaluation.

## 2016-04-10 LAB — URINE CULTURE: Colony Count: >=100000

## 2016-04-11 ENCOUNTER — Encounter: Payer: Self-pay | Admitting: Family Medicine

## 2016-04-11 ENCOUNTER — Other Ambulatory Visit: Payer: Self-pay | Admitting: Family Medicine

## 2016-04-11 MED ORDER — SULFAMETHOXAZOLE-TRIMETHOPRIM 800-160 MG PO TABS: 1.0000 | ORAL_TABLET | Freq: Two times a day (BID) | ORAL | Status: DC

## 2016-07-16 ENCOUNTER — Encounter: Payer: Self-pay | Admitting: Gastroenterology

## 2016-09-11 ENCOUNTER — Encounter: Payer: Self-pay | Admitting: Family Medicine

## 2016-09-11 ENCOUNTER — Telehealth: Payer: Self-pay | Admitting: Family Medicine

## 2016-09-11 NOTE — Telephone Encounter (Signed)
Pt is stating that she is taking AZO and want to know if that would affect her urine test?  Pt would like to have call.

## 2016-09-11 NOTE — Telephone Encounter (Signed)
Sent her a Pharmacist, community message on this but you can call as well if you would like- can affect results of urine on urinalysis so if someone is concerned about UTI I get a urine culture.

## 2016-09-13 ENCOUNTER — Emergency Department (HOSPITAL_COMMUNITY)
Admission: EM | Admit: 2016-09-13 | Discharge: 2016-09-13 | Disposition: A | Discharge status: Home / Self Care | Payer: BC Managed Care – PPO | Attending: Emergency Medicine | Admitting: Emergency Medicine

## 2016-09-13 ENCOUNTER — Encounter (HOSPITAL_COMMUNITY): Payer: Self-pay | Admitting: *Deleted

## 2016-09-13 DIAGNOSIS — Z87891 Personal history of nicotine dependence: Secondary | ICD-10-CM | POA: Insufficient documentation

## 2016-09-13 DIAGNOSIS — N3001 Acute cystitis with hematuria: Secondary | ICD-10-CM | POA: Insufficient documentation

## 2016-09-13 DIAGNOSIS — R319 Hematuria, unspecified: Secondary | ICD-10-CM | POA: Diagnosis present

## 2016-09-13 HISTORY — DX: Urinary tract infection, site not specified: N39.0

## 2016-09-13 LAB — URINE MICROSCOPIC-ADD ON

## 2016-09-13 LAB — URINALYSIS, ROUTINE W REFLEX MICROSCOPIC
GLUCOSE, UA: NEGATIVE mg/dL
Nitrite: NEGATIVE
PH: 6.5 (ref 5.0–8.0)
Protein, ur: >300 mg/dL — AB
Specific Gravity, Urine: >1.03 — ABNORMAL HIGH (ref 1.005–1.030)

## 2016-09-13 MED ORDER — CEFTRIAXONE SODIUM 1 G IJ SOLR
1.0000 g | Freq: Once | INTRAMUSCULAR | Status: AC
  Administered 2016-09-13: 1 g via INTRAMUSCULAR
  Filled 2016-09-13: qty 10

## 2016-09-13 MED ORDER — LIDOCAINE HCL (PF) 1 % IJ SOLN
INTRAMUSCULAR | Status: AC
  Administered 2016-09-13: 06:00:00
  Filled 2016-09-13: qty 5

## 2016-09-13 MED ORDER — URIBEL 118 MG PO CAPS: 1.0000 | ORAL_CAPSULE | Freq: Four times a day (QID) | ORAL | 0 refills | Status: DC | PRN

## 2016-09-13 MED ORDER — CEFUROXIME AXETIL 250 MG PO TABS: 250.0000 mg | ORAL_TABLET | Freq: Two times a day (BID) | ORAL | 0 refills | Status: DC

## 2016-09-13 NOTE — ED Provider Notes (Signed)
[facility] DEPT Provider Note   CSN: [id number] Arrival date & time: 09/13/16  0457     History   Chief Complaint Chief Complaint  Patient presents with  . Urinary Tract Infection    HPI Alexis Wallace is a 60 y.o. female.  Patient presents to the emergency department for evaluation of bladder spasms and hematuria. Patient reports that she has had a refractory urinary tract infection in the past. She is seeing a gynecology-urology specialist at [facility]. She has been on long-term Bactrim with improvement of her symptoms until the last few days. Patient has not had fever, nausea, vomiting or diarrhea. She reports urinary urgency and frequency with end stream pain.      Past Medical History:  Diagnosis Date  . Allergy   . Anemia, iron deficiency   . B12 deficiency   . GERD (gastroesophageal reflux disease)   . Iron deficiency   . Osteoarthritis   . Osteopenia   . Solitary pulmonary nodule 12/05/2014   Dx 11/19/14  3 mm > f/u 11/27/2015 > resolved, no further studies rec    . Urinary tract infection     Patient Active Problem List   Diagnosis Date Noted  . Recurrent UTI 04/07/2016  . Plantar fasciitis, right 12/13/2014  . Solitary pulmonary nodule 12/05/2014  . CAP (community acquired pneumonia) 11/18/2014  . Rosacea 08/30/2014  . Insomnia 08/30/2014  . Family history of hypertrophic cardiomyopathy 05/13/2011  . Actinic keratosis 10/22/2010  . Bariatric surgery status 09/01/2010  . 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 ALLERGIC RHINITIS 03/01/2008  . ANEMIA-IRON DEFICIENCY 11/21/2007  . ANEMIA, B12 DEFICIENCY 08/15/2007  . GERD 06/02/2007  . PSORIASIS 06/02/2007  . OSTEOARTHRITIS 06/02/2007  . OSTEOPENIA 06/02/2007    Past Surgical History:  Procedure Laterality Date  . carpal tunnel right    . COSMETIC SURGERY     tummy tuck  . GASTRIC BYPASS  2001    OB History    No data available         Home Medications    Prior to Admission medications   Medication Sig Start Date End Date Taking? Authorizing Provider  calcium carbonate 200 MG capsule Take 3,000 mg by mouth daily.    Historical Provider, MD  cefUROXime (CEFTIN) 250 MG tablet Take 1 tablet (250 mg total) by mouth 2 (two) times daily with a meal. 09/13/16   Orpah Greek, MD  estradiol (ESTRACE) 2 MG tablet Take 1 mg by mouth daily.     Historical Provider, MD  medroxyPROGESTERone (PROVERA) 10 MG tablet Take 10 mg by mouth daily.    Historical Provider, MD  Meth-Hyo-M Bl-Na Phos-Ph Sal (URIBEL) 118 MG CAPS Take 1 capsule (118 mg total) by mouth 4 (four) times daily as needed (urinary pain). 09/13/16   Orpah Greek, MD  Multiple Vitamin (MULTIVITAMIN) capsule Take 1 capsule by mouth daily.      Historical Provider, MD  omeprazole (PRILOSEC) 20 MG capsule TAKE ONE CAPSULE BY MOUTH TWICE A DAY 02/13/16   Marin Olp, MD  sulfamethoxazole-trimethoprim (BACTRIM DS,SEPTRA DS) 800-160 MG tablet Take 1 tablet by mouth 2 (two) times daily. 04/11/16   Marin Olp, MD  temazepam (RESTORIL) 15 MG capsule Take 1 capsule (15 mg total) by mouth at bedtime as needed for sleep. Patient not taking: Reported on 04/07/2016 12/10/15   Marin Olp, MD    Family History Family History  Problem Relation Age  of Onset  . Heart disease Father   . Lung cancer Mother     stage IV, smoker  . Heart disease Sister     Hypertrophic cardiomyopathy    Social History Social History  Substance Use Topics  . Smoking status: Former Smoker    Packs/day: 1.00    Years: 5.00    Types: Cigarettes    Quit date: 02/10/1978  . Smokeless tobacco: Never Used  . Alcohol use 0.0 oz/week     Comment: rare     Allergies   Review of patient's allergies indicates no known allergies.   Review of Systems Review of Systems  Genitourinary: Positive for dysuria, frequency, hematuria and urgency.  All other systems reviewed and are  negative.    Physical Exam Updated Vital Signs Ht 5\' 8"  (1.727 m)   Wt 199 lb (90.3 kg)   BMI 30.26 kg/m   Physical Exam  Constitutional: She is oriented to person, place, and time. She appears well-developed and well-nourished. No distress.  HENT:  Head: Normocephalic and atraumatic.  Right Ear: Hearing normal.  Left Ear: Hearing normal.  Nose: Nose normal.  Mouth/Throat: Oropharynx is clear and moist and mucous membranes are normal.  Eyes: Conjunctivae and EOM are normal. Pupils are equal, round, and reactive to light.  Neck: Normal range of motion. Neck supple.  Cardiovascular: Regular rhythm, S1 normal and S2 normal.  Exam reveals no gallop and no friction rub.   No murmur heard. Pulmonary/Chest: Effort normal and breath sounds normal. No respiratory distress. She exhibits no tenderness.  Abdominal: Soft. Normal appearance and bowel sounds are normal. There is no hepatosplenomegaly. There is no tenderness. There is no rebound, no guarding, no tenderness at McBurney's point and negative Murphy's sign. No hernia.  Musculoskeletal: Normal range of motion.  Neurological: She is alert and oriented to person, place, and time. She has normal strength. No cranial nerve deficit or sensory deficit. Coordination normal. GCS eye subscore is 4. GCS verbal subscore is 5. GCS motor subscore is 6.  Skin: Skin is warm, dry and intact. No rash noted. No cyanosis.  Psychiatric: She has a normal mood and affect. Her speech is normal and behavior is normal. Thought content normal.  Nursing note and vitals reviewed.    ED Treatments / Results  Labs (all labs ordered are listed, but only abnormal results are displayed) Labs Reviewed  URINE CULTURE  URINALYSIS, ROUTINE W REFLEX MICROSCOPIC (NOT AT [facility])    EKG  EKG Interpretation None       Radiology No results found.  Procedures Procedures (including critical care time)  Medications Ordered in ED Medications  cefTRIAXone  (ROCEPHIN) injection 1 g (not administered)     Initial Impression / Assessment and Plan / ED Course  I have reviewed the triage vital signs and the nursing notes.  Pertinent labs & imaging results that were available during my care of the patient were reviewed by me and considered in my medical decision making (see chart for details).  Clinical Course  Patient with history of recurrent urinary tract infection presents with symptoms that are very similar to those that she has had in the past and are convincing for urinary tract infection. Based on her multiple antibiotic usage in the past, will obtain urine culture. Initiate Rocephin and Ceftin, follow-up with her specialist at [facility].  Final Clinical Impressions(s) / ED Diagnoses   Final diagnoses:  Acute cystitis with hematuria    New Prescriptions New Prescriptions   CEFUROXIME (  CEFTIN) 250 MG TABLET    Take 1 tablet (250 mg total) by mouth 2 (two) times daily with a meal.   METH-HYO-M BL-NA PHOS-PH SAL (URIBEL) 118 MG CAPS    Take 1 capsule (118 mg total) by mouth 4 (four) times daily as needed (urinary pain).     Orpah Greek, MD 09/13/16 [phone]

## 2016-09-13 NOTE — ED Triage Notes (Signed)
Pt states that she has been having problems with uti's , started having spasms and blood in her urine a week ago,

## 2016-09-14 ENCOUNTER — Ambulatory Visit: Payer: BC Managed Care – PPO | Admitting: Family Medicine

## 2016-09-15 ENCOUNTER — Ambulatory Visit (AMBULATORY_SURGERY_CENTER): Payer: Self-pay | Admitting: *Deleted

## 2016-09-15 VITALS — Ht 67.5 in | Wt 200.0 lb

## 2016-09-15 DIAGNOSIS — Z1211 Encounter for screening for malignant neoplasm of colon: Secondary | ICD-10-CM

## 2016-09-15 LAB — URINE CULTURE

## 2016-09-15 MED ORDER — NA SULFATE-K SULFATE-MG SULF 17.5-3.13-1.6 GM/177ML PO SOLN: ORAL | 0 refills | Status: DC

## 2016-09-15 NOTE — Progress Notes (Signed)
Patient denies any allergies to eggs or soy. Patient denies any problems with anesthesia/sedation. Patient denies any oxygen use at home and does not take any diet/weight loss medications.  

## 2016-09-18 ENCOUNTER — Encounter: Payer: Self-pay | Admitting: Gastroenterology

## 2016-10-02 ENCOUNTER — Encounter: Payer: Self-pay | Admitting: Gastroenterology

## 2016-10-02 ENCOUNTER — Ambulatory Visit (AMBULATORY_SURGERY_CENTER): Payer: BC Managed Care – PPO | Admitting: Gastroenterology

## 2016-10-02 VITALS — BP 129/63 | HR 62 | Temp 98.4°F | Resp 19 | Ht 67.0 in | Wt 200.0 lb

## 2016-10-02 DIAGNOSIS — D128 Benign neoplasm of rectum: Secondary | ICD-10-CM | POA: Diagnosis not present

## 2016-10-02 DIAGNOSIS — Z1211 Encounter for screening for malignant neoplasm of colon: Secondary | ICD-10-CM

## 2016-10-02 DIAGNOSIS — D122 Benign neoplasm of ascending colon: Secondary | ICD-10-CM | POA: Diagnosis not present

## 2016-10-02 DIAGNOSIS — D124 Benign neoplasm of descending colon: Secondary | ICD-10-CM

## 2016-10-02 DIAGNOSIS — Z1212 Encounter for screening for malignant neoplasm of rectum: Secondary | ICD-10-CM

## 2016-10-02 MED ORDER — SODIUM CHLORIDE 0.9 % IV SOLN: 500.0000 mL | INTRAVENOUS | Status: DC

## 2016-10-02 NOTE — Patient Instructions (Signed)
Discharge instructions given. Handouts on polyps and hemorrhoids. No aspirin,ibuprofen,naproxen, or other non-steroidal anti-inflammatory drugs for 2 weeks. Resume previous medications. YOU HAD AN ENDOSCOPIC PROCEDURE TODAY AT [facility]:   Refer to the procedure report that was given to you for any specific questions about what was found during the examination.  If the procedure report does not answer your questions, please call your gastroenterologist to clarify.  If you requested that your care partner not be given the details of your procedure findings, then the procedure report has been included in a sealed envelope for you to review at your convenience later.  YOU SHOULD EXPECT: Some feelings of bloating in the abdomen. Passage of more gas than usual.  Walking can help get rid of the air that was put into your GI tract during the procedure and reduce the bloating. If you had a lower endoscopy (such as a colonoscopy or flexible sigmoidoscopy) you may notice spotting of blood in your stool or on the toilet paper. If you underwent a bowel prep for your procedure, you may not have a normal bowel movement for a few days.  Please Note:  You might notice some irritation and congestion in your nose or some drainage.  This is from the oxygen used during your procedure.  There is no need for concern and it should clear up in a day or so.  SYMPTOMS TO REPORT IMMEDIATELY:   Following lower endoscopy (colonoscopy or flexible sigmoidoscopy):  Excessive amounts of blood in the stool  Significant tenderness or worsening of abdominal pains  Swelling of the abdomen that is new, acute  Fever of 100F or higher   For urgent or emergent issues, a gastroenterologist can be reached at any hour by calling [phone].   DIET:  We do recommend a small meal at first, but then you may proceed to your regular diet.  Drink plenty of fluids but you should avoid alcoholic beverages for 24  hours.  ACTIVITY:  You should plan to take it easy for the rest of today and you should NOT DRIVE or use heavy machinery until tomorrow (because of the sedation medicines used during the test).    FOLLOW UP: Our staff will call the number listed on your records the next business day following your procedure to check on you and address any questions or concerns that you may have regarding the information given to you following your procedure. If we do not reach you, we will leave a message.  However, if you are feeling well and you are not experiencing any problems, there is no need to return our call.  We will assume that you have returned to your regular daily activities without incident.  If any biopsies were taken you will be contacted by phone or by letter within the next 1-3 weeks.  Please call us at [phone] if you have not heard about the biopsies in 3 weeks.    SIGNATURES/CONFIDENTIALITY: You and/or your care partner have signed paperwork which will be entered into your electronic medical record.  These signatures attest to the fact that that the information above on your After Visit Summary has been reviewed and is understood.  Full responsibility of the confidentiality of this discharge information lies with you and/or your care-partner.

## 2016-10-02 NOTE — Progress Notes (Signed)
Called to room to assist during endoscopic procedure.  Patient ID and intended procedure confirmed with present staff. Received instructions for my participation in the procedure from the performing physician.  

## 2016-10-02 NOTE — Progress Notes (Signed)
To recovery, report to McCoy, RN, VSS 

## 2016-10-02 NOTE — Op Note (Signed)
[place] Patient Name: Alexis Wallace Procedure Date: 10/02/2016 7:59 AM MRN: [id number] Endoscopist: Mauri Pole , MD Age: 60 Referring MD:  Date of Birth: April 05, 1956 Gender: Female Account #: [id number] Procedure:                Colonoscopy Indications:              Screening for colorectal malignant neoplasm, Last                            colonoscopy: 2006 Medicines:                Monitored Anesthesia Care Procedure:                Pre-Anesthesia Assessment:                           - Prior to the procedure, a History and Physical                            was performed, and patient medications and                            allergies were reviewed. The patient's tolerance of                            previous anesthesia was also reviewed. The risks                            and benefits of the procedure and the sedation                            options and risks were discussed with the patient.                            All questions were answered, and informed consent                            was obtained. Prior Anticoagulants: The patient has                            taken no previous anticoagulant or antiplatelet                            agents. ASA Grade Assessment: II - A patient with                            mild systemic disease. After reviewing the risks                            and benefits, the patient was deemed in                            satisfactory condition to undergo the procedure.  After obtaining informed consent, the colonoscope                            was passed under direct vision. Throughout the                            procedure, the patient's blood pressure, pulse, and                            oxygen saturations were monitored continuously. The                            Model CF-HQ190L [id number]) scope was introduced                            through the anus and advanced to  the the terminal                            ileum, with identification of the appendiceal                            orifice and IC valve. The colonoscopy was somewhat                            difficult due to a tortuous colon and the patient's                            body habitus. The patient tolerated the procedure                            well. The quality of the bowel preparation was                            good. The terminal ileum, ileocecal valve,                            appendiceal orifice, and rectum were photographed.                            The quality of the bowel preparation was evaluated                            using the BBPS [facility] Bowel Preparation Scale)                            with scores of: Right Colon = 3, Transverse Colon =                            3 and Left Colon = 3 (entire mucosa seen well with                            no residual staining, small fragments of stool or  opaque liquid). The total BBPS score equals 9. Scope In: 8:13:15 AM Scope Out: 8:35:58 AM Scope Withdrawal Time: 0 hours 18 minutes 44 seconds  Total Procedure Duration: 0 hours 22 minutes 43 seconds  Findings:                 The perianal and digital rectal examinations were                            normal.                           A 2 mm polyp was found in the ascending colon. The                            polyp was sessile. The polyp was removed with a                            cold biopsy forceps. Resection and retrieval were                            complete.                           A 5 mm polyp was found in the descending colon. The                            polyp was sessile. The polyp was removed with a                            cold snare. Resection and retrieval were complete.                           A 12 mm polyp was found in the rectum, 10 cm from                            anal verge. The polyp was semi-pedunculated. The                             polyp was removed with a hot snare. Resection and                            retrieval were complete.                           Non-bleeding internal hemorrhoids were found during                            retroflexion. The hemorrhoids were small. Complications:            No immediate complications. Estimated Blood Loss:     Estimated blood loss was minimal. Impression:               - One 2 mm polyp in the ascending colon, removed  with a cold biopsy forceps. Resected and retrieved.                           - One 5 mm polyp in the descending colon, removed                            with a cold snare. Resected and retrieved.                           - One 12 mm polyp in the rectum, removed with a hot                            snare. Resected and retrieved.                           - Non-bleeding internal hemorrhoids. Recommendation:           - Patient has a contact number available for                            emergencies. The signs and symptoms of potential                            delayed complications were discussed with the                            patient. Return to normal activities tomorrow.                            Written discharge instructions were provided to the                            patient.                           - Resume previous diet.                           - Continue present medications.                           - Await pathology results.                           - Repeat colonoscopy in 3 years for surveillance.                           - No aspirin, ibuprofen, naproxen, or other                            non-steroidal anti-inflammatory drugs for 2 weeks. Mauri Pole, MD 10/02/2016 8:41:40 AM This report has been signed electronically.

## 2016-10-05 ENCOUNTER — Telehealth: Payer: Self-pay | Admitting: *Deleted

## 2016-10-05 NOTE — Telephone Encounter (Signed)
  Follow up Call-  Call back number 10/02/2016  Post procedure Call Back phone  # [phone]  Permission to leave phone message Yes  Some recent data might be hidden     Patient questions:  Do you have a fever, pain , or abdominal swelling? No. Pain Score  0 *  Have you tolerated food without any problems? Yes.    Have you been able to return to your normal activities? Yes.    Do you have any questions about your discharge instructions: Diet   No. Medications  No. Follow up visit  No.  Do you have questions or concerns about your Care? No.  Actions: * If pain score is 4 or above: No action needed, pain <4.

## 2016-10-11 ENCOUNTER — Encounter: Payer: Self-pay | Admitting: Gastroenterology

## 2016-10-12 ENCOUNTER — Encounter: Payer: Self-pay | Admitting: Family Medicine

## 2016-10-12 DIAGNOSIS — Z86010 Personal history of colonic polyps: Secondary | ICD-10-CM | POA: Insufficient documentation

## 2016-12-04 ENCOUNTER — Ambulatory Visit (INDEPENDENT_AMBULATORY_CARE_PROVIDER_SITE_OTHER): Payer: BC Managed Care – PPO | Admitting: Family Medicine

## 2016-12-04 ENCOUNTER — Encounter: Payer: Self-pay | Admitting: Family Medicine

## 2016-12-04 VITALS — BP 138/84 | HR 62 | Temp 98.2°F | Ht 67.0 in | Wt 200.0 lb

## 2016-12-04 DIAGNOSIS — S2329XA Dislocation of other parts of thorax, initial encounter: Secondary | ICD-10-CM

## 2016-12-04 MED ORDER — TRAMADOL HCL 50 MG PO TABS: ORAL_TABLET | ORAL | 0 refills | Status: DC

## 2016-12-04 NOTE — Progress Notes (Signed)
Pre visit review using our clinic review tool, if applicable. No additional management support is needed unless otherwise documented below in the visit note. 

## 2016-12-04 NOTE — Progress Notes (Signed)
   Subjective:    Patient ID: Clayborne Artist, female    DOB: [birth year], 60 y.o.   MRN: [id number]  HPI Here for right sided rib pains after an incident about 8 days ago. While she was leaning across a an infant car seat in her car to pick up something, she felt a "pop" and had a sudden sharp pain in the right lower rib area. The pain has persisted ever since. She has applied heat and has been taking 800 mg of Ibuprofen 2-3 times a day, but this has not helped much. No SOB.    Review of Systems  Constitutional: Negative.   Respiratory: Negative.   Cardiovascular: Positive for chest pain. Negative for palpitations and leg swelling.  Gastrointestinal: Negative.   Neurological: Negative.        Objective:   Physical Exam  Constitutional: She appears well-developed and well-nourished. No distress.  Cardiovascular: Normal rate, regular rhythm, normal heart sounds and intact distal pulses.   Pulmonary/Chest: Effort normal and breath sounds normal. No respiratory distress. She has no wheezes. She has no rales.  Mildly tender in the right lower anterior rib area. No crepitus or ecchymoses   Abdominal: Soft. Bowel sounds are normal. She exhibits no distension and no mass. There is no tenderness. There is no rebound and no guarding.          Assessment & Plan:  Probable rib separation. Rest. Instead of Ibuprofen she can use an Aleve bid, and add Tramadol for sleeping at night. This should resolve over the next few weeks. Recheck prn.  Alysia Penna, MD

## 2016-12-23 ENCOUNTER — Other Ambulatory Visit: Payer: Self-pay | Admitting: Family Medicine

## 2016-12-30 LAB — HM MAMMOGRAPHY

## 2016-12-30 LAB — HM PAP SMEAR: HM Pap smear: NORMAL

## 2017-02-26 ENCOUNTER — Other Ambulatory Visit: Payer: Self-pay | Admitting: Family Medicine

## 2017-03-01 ENCOUNTER — Telehealth: Payer: Self-pay

## 2017-03-01 NOTE — Telephone Encounter (Signed)
Received PA request for Temazepam from [facility]. PA submitted & is pending. [id number]

## 2017-03-10 NOTE — Telephone Encounter (Signed)
PA denied, they will only cover 15 capsules per month. This is the maximum quantity that the plan covers & the request for additional quantities is denied.

## 2017-03-11 NOTE — Telephone Encounter (Signed)
Inform patient that #15 per month is maximum allowed per insurance.

## 2017-03-11 NOTE — Telephone Encounter (Signed)
I left a detailed message with the information below at the pts cell number. 

## 2017-04-19 ENCOUNTER — Ambulatory Visit: Payer: BC Managed Care – PPO | Admitting: Podiatry

## 2017-05-20 ENCOUNTER — Ambulatory Visit (INDEPENDENT_AMBULATORY_CARE_PROVIDER_SITE_OTHER): Payer: BC Managed Care – PPO | Admitting: Family Medicine

## 2017-05-20 ENCOUNTER — Encounter: Payer: Self-pay | Admitting: Family Medicine

## 2017-05-20 VITALS — BP 120/62 | HR 66 | Temp 98.1°F | Ht 66.0 in | Wt 201.6 lb

## 2017-05-20 DIAGNOSIS — Z0000 Encounter for general adult medical examination without abnormal findings: Secondary | ICD-10-CM | POA: Diagnosis not present

## 2017-05-20 DIAGNOSIS — F5101 Primary insomnia: Secondary | ICD-10-CM

## 2017-05-20 DIAGNOSIS — E785 Hyperlipidemia, unspecified: Secondary | ICD-10-CM | POA: Insufficient documentation

## 2017-05-20 DIAGNOSIS — K219 Gastro-esophageal reflux disease without esophagitis: Secondary | ICD-10-CM

## 2017-05-20 DIAGNOSIS — Z9884 Bariatric surgery status: Secondary | ICD-10-CM

## 2017-05-20 LAB — COMPREHENSIVE METABOLIC PANEL
ALBUMIN: 3.9 g/dL (ref 3.5–5.2)
ALT: 18 U/L (ref 0–35)
AST: 18 U/L (ref 0–37)
Alkaline Phosphatase: 83 U/L (ref 39–117)
BUN: 15 mg/dL (ref 6–23)
CALCIUM: 9.2 mg/dL (ref 8.4–10.5)
CHLORIDE: 104 meq/L (ref 96–112)
CO2: 32 meq/L (ref 19–32)
Creatinine, Ser: 0.62 mg/dL (ref 0.40–1.20)
GFR: 103.2 mL/min (ref >60.00)
Glucose, Bld: 90 mg/dL (ref 70–99)
POTASSIUM: 4.2 meq/L (ref 3.5–5.1)
Sodium: 140 mEq/L (ref 135–145)
Total Bilirubin: 0.3 mg/dL (ref 0.2–1.2)
Total Protein: 6.3 g/dL (ref 6.0–8.3)

## 2017-05-20 LAB — CBC
HEMATOCRIT: 39.7 % (ref 36.0–46.0)
HEMOGLOBIN: 13.2 g/dL (ref 12.0–15.0)
MCHC: 33.4 g/dL (ref 30.0–36.0)
MCV: 93.4 fl (ref 78.0–100.0)
PLATELETS: 207 10*3/uL (ref 150.0–400.0)
RBC: 4.25 Mil/uL (ref 3.87–5.11)
RDW: 12.6 % (ref 11.5–15.5)
WBC: 6 10*3/uL (ref 4.0–10.5)

## 2017-05-20 LAB — LIPID PANEL
CHOL/HDL RATIO: 3
CHOLESTEROL: 165 mg/dL (ref 0–200)
HDL: 60.7 mg/dL (ref >39.00)
LDL CALC: 86 mg/dL (ref 0–99)
NonHDL: 104.3
Triglycerides: 90 mg/dL (ref 0.0–149.0)
VLDL: 18 mg/dL (ref 0.0–40.0)

## 2017-05-20 LAB — VITAMIN D 25 HYDROXY (VIT D DEFICIENCY, FRACTURES): VITD: 26.24 ng/mL — ABNORMAL LOW (ref 30.00–100.00)

## 2017-05-20 LAB — VITAMIN B12: VITAMIN B 12: 514 pg/mL (ref 211–911)

## 2017-05-20 LAB — FERRITIN: FERRITIN: 90.9 ng/mL (ref 10.0–291.0)

## 2017-05-20 MED ORDER — OMEPRAZOLE 20 MG PO CPDR: 20.0000 mg | DELAYED_RELEASE_CAPSULE | Freq: Every day | ORAL | 3 refills | Status: DC

## 2017-05-20 MED ORDER — VALACYCLOVIR HCL 1 G PO TABS: ORAL_TABLET | ORAL | 1 refills | Status: DC

## 2017-05-20 MED ORDER — TEMAZEPAM 15 MG PO CAPS: 15.0000 mg | ORAL_CAPSULE | Freq: Every evening | ORAL | 1 refills | Status: DC | PRN

## 2017-05-20 NOTE — Patient Instructions (Addendum)
Sign release of information at the check out desk for mammogram and pap smear  Please stop by lab before you go  Use prilosec for at least 6 weeks- if not improved follow up with me so we can reevaluate  ______________________________________________________________________  Starting October 1st 2018, I will be transferring to our new location: [place] (corner of [address] and [address] from [employer]) [place], [place] Phone: [phone]  I would love to have you remain my patient at this new location as long as it remains convenient for you. I am excited about the opportunity to have x-ray and sports medicine in the new building but will really miss the awesome staff and physicians at [facility]. Continue to schedule appointments at [facility] and we will automatically transfer them to the [place] location starting October 1st.

## 2017-05-20 NOTE — Progress Notes (Signed)
Phone: [phone]  Subjective:  Patient presents today for their annual physical. Chief complaint-noted.   See problem oriented charting- ROS- full  review of systems was completed and negative except for: intermittent RUQ/ epigastric pain at night when lays down  The following were reviewed and entered/updated in epic: Past Medical History:  Diagnosis Date  . Allergy   . Anemia, iron deficiency   . B12 deficiency   . CAP (community acquired pneumonia) 11/18/2014  . GERD (gastroesophageal reflux disease)   . Iron deficiency   . Osteoarthritis   . Osteopenia   . Solitary pulmonary nodule 12/05/2014   Dx 11/19/14  3 mm > f/u 11/27/2015 > resolved, no further studies rec    . Urinary tract infection    Patient Active Problem List   Diagnosis Date Noted  . Recurrent UTI 04/07/2016    Priority: High  . Hyperlipidemia 05/20/2017    Priority: Medium  . History of adenomatous polyp of colon 10/12/2016    Priority: Medium  . Rosacea 08/30/2014    Priority: Medium  . Insomnia 08/30/2014    Priority: Medium  . Bariatric surgery status 09/01/2010    Priority: Medium  . 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 Plantar fasciitis, right 12/13/2014    Priority: Low  . Solitary pulmonary nodule 12/05/2014    Priority: Low  . CAP (community acquired pneumonia) 11/18/2014    Priority: Low  . Family history of hypertrophic cardiomyopathy 05/13/2011    Priority: Low  . Actinic keratosis 10/22/2010    Priority: Low  . 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 ALLERGIC RHINITIS 03/01/2008    Priority: Low  . ANEMIA-IRON DEFICIENCY 11/21/2007    Priority: Low  . ANEMIA, B12 DEFICIENCY 08/15/2007    Priority: Low  . GERD 06/02/2007    Priority: Low  . PSORIASIS 06/02/2007    Priority: Low  . OSTEOARTHRITIS 06/02/2007    Priority: Low  . OSTEOPENIA 06/02/2007    Priority: Low   Past Surgical History:  Procedure  Laterality Date  . carpal tunnel right    . COSMETIC SURGERY     tummy tuck  . GASTRIC BYPASS  2001    Family History  Problem Relation Age of Onset  . Heart disease Father   . Lung cancer Mother        stage IV, smoker  . Heart disease Sister        Hypertrophic cardiomyopathy  . Colon cancer Neg Hx     Medications- reviewed and updated Current Outpatient Prescriptions  Medication Sig Dispense Refill  . calcium carbonate 200 MG capsule Take 3,000 mg by mouth daily.    Marland Kitchen estradiol (ESTRACE) 2 MG tablet Take 1 mg by mouth daily.     . medroxyPROGESTERone (PROVERA) 10 MG tablet Take 10 mg by mouth daily.    . Meth-Hyo-M Bl-Na Phos-Ph Sal (URIBEL) 118 MG CAPS Take 1 capsule (118 mg total) by mouth 4 (four) times daily as needed (urinary pain). 120 capsule 0  . Multiple Vitamin (MULTIVITAMIN) capsule Take 1 capsule by mouth daily.      Marland Kitchen omeprazole (PRILOSEC) 20 MG capsule Take 1 capsule (20 mg total) by mouth daily. 30 capsule 3  . progesterone (PROMETRIUM) 100 MG capsule Take 1 capsule by mouth daily.    . temazepam (RESTORIL) 15 MG capsule Take 1 capsule (15 mg total) by mouth at bedtime as needed. for sleep 30 capsule  1  . valACYclovir (VALTREX) 1000 MG tablet Take 2 pills twice a day for 1 day at first sign of cold sore 30 tablet 1   No current facility-administered medications for this visit.     Allergies-reviewed and updated No Known Allergies  Social History   Social History  . Marital status: Married    Spouse name: N/A  . Number of children: N/A  . Years of education: N/A   Social History Main Topics  . Smoking status: Former Smoker    Packs/day: 1.00    Years: 5.00    Types: Cigarettes    Quit date: 02/10/1978  . Smokeless tobacco: Never Used  . Alcohol use No  . Drug use: No  . Sexual activity: Yes   Other Topics Concern  . None   Social History Narrative   Married to [employer]. 2 kids. Jenny Reichmann (his wife had TBI being struck by car) with daughter [name].  Patient takes care of [name] and son lives with her and husband) and Colletta Maryland lives in [place] with no kids.       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  Raising granddaughter after daughter has TBI.       Hobbies: spending time with friends and family             Objective: BP 120/62 (BP Location: Left Arm, Patient Position: Sitting, Cuff Size: Large)   Pulse 66   Temp 98.1 F (36.7 C) (Oral)   Ht 5\' 6"  (1.676 m)   Wt 201 lb 9.6 oz (91.4 kg)   SpO2 98%   BMI 32.54 kg/m  Gen: NAD, resting comfortably HEENT: Mucous membranes are moist. Oropharynx normal Neck: no thyromegaly CV: RRR no murmurs rubs or gallops Lungs: CTAB no crackles, wheeze, rhonchi Abdomen: soft/nontender including RUQ/nondistended/normal bowel sounds. No rebound or guarding.  Ext: trace edema Skin: warm, dry Neuro: grossly normal, moves all extremities, PERRLA  Assessment/Plan:  61 y.o. female presenting for annual physical.  Health Maintenance counseling: 1. Anticipatory guidance: Patient counseled regarding regular dental exams q6 months, eye exams yearly, wearing seatbelts.  2. Risk factor reduction:  Advised patient of need for regular exercise and diet rich and fruits and vegetables to reduce risk of heart attack and stroke. Exercise- 3 days a week. Diet-maintaining weight. Advised return to weight watchers.  Wt Readings from Last 3 Encounters:  05/20/17 201 lb 9.6 oz (91.4 kg)  12/04/16 200 lb (90.7 kg)  10/02/16 200 lb (90.7 kg)  3. Immunizations/screenings/ancillary studies- hold off on shingrix until next year Immunization History  Administered Date(s) Administered  . Influenza Split 10/03/2012  . Influenza Whole 09/06/2001, 10/12/2007, 09/25/2008, 09/01/2010  . Influenza,inj,Quad PF,36+ Mos 08/28/2013, 08/30/2014  . Influenza-Unspecified 09/21/2015, 08/07/2016  . Td 06/07/2003  . Tdap  12/10/2015  4. Cervical cancer screening- 2015 in records, reports 2017= get records 5. Breast cancer screening-  breast exam with GYN and mammogram in the winter- need records 6. Colon cancer screening - 10/02/16 with 3 year follow up 7. Skin cancer screening- using regular sunscreen. Denies lesions she is concerned about. Has not seen Dr. Denna Haggard as psoriasis has been at [place] [facility].   Status of chronic or acute concerns   History of bariatric surgery- weight stable over last 8 months. Update bariatric labs  HLD- update lipids, though was mild  [facility] enrolled in study for recurrent uti. Had been on daily  prophylaxis now off.   Vitamin D sparingly. Advised regular use  Some pain in right hip and left knee (some crepitus). Monitor only.    Insomnia Insomnia- temazepam less than once a week (1-2x a month). Often with prolonged travel needs to readjust. Going to [place] soon  GERD No exertional chest pain. Often at night she is getting chest pain radiating to the back. Does not take omeprazole regularly. Not necessarily with fatty meals. 3x this month total.   Advised regular omeprazole use- will refill  Return in about 6 weeks (around 07/01/2017).  Orders Placed This Encounter  Procedures  . Comprehensive metabolic panel    [place]    Order Specific Question:   Has the patient fasted?    Answer:   No  . CBC    [place]  . Lipid panel    [place]    Order Specific Question:   Has the patient fasted?    Answer:   No  . Iron and TIBC  . Ferritin  . Vitamin B12  . VITAMIN D 25 Hydroxy (Vit-D Deficiency, Fractures)  . PTH, intact and calcium  . Vitamin B1  . Folate RBC  . Zinc  . Copper, serum    Meds ordered this encounter  Medications  . progesterone (PROMETRIUM) 100 MG capsule    Sig: Take 1 capsule by mouth daily.  . valACYclovir (VALTREX) 1000 MG tablet    Sig: Take 2 pills twice a day for 1 day at first sign of cold sore    Dispense:  30 tablet    Refill:  1   . temazepam (RESTORIL) 15 MG capsule    Sig: Take 1 capsule (15 mg total) by mouth at bedtime as needed. for sleep    Dispense:  30 capsule    Refill:  1  . omeprazole (PRILOSEC) 20 MG capsule    Sig: Take 1 capsule (20 mg total) by mouth daily.    Dispense:  30 capsule    Refill:  3   Return precautions advised.  [name], MD

## 2017-05-20 NOTE — Assessment & Plan Note (Signed)
Insomnia- temazepam less than once a week (1-2x a month). Often with prolonged travel needs to readjust. Going to [place] soon

## 2017-05-20 NOTE — Assessment & Plan Note (Signed)
No exertional chest pain. Often at night she is getting chest pain radiating to the back. Does not take omeprazole regularly. Not necessarily with fatty meals. 3x this month total.   Advised regular omeprazole use- will refill

## 2017-05-21 LAB — IRON AND TIBC
%SAT: 25 % (ref 11–50)
Iron: 79 ug/dL (ref 45–160)
TIBC: 312 ug/dL (ref 250–450)
UIBC: 233 ug/dL

## 2017-05-21 LAB — FOLATE RBC: RBC FOLATE: 636 ng/mL (ref >280)

## 2017-05-21 LAB — PTH, INTACT AND CALCIUM
CALCIUM: 9 mg/dL (ref 8.6–10.4)
PTH: 52 pg/mL (ref 14–64)

## 2017-05-22 LAB — ZINC: ZINC: 53 ug/dL — AB (ref 60–130)

## 2017-05-22 LAB — COPPER, SERUM: COPPER: 137 ug/dL (ref 70–175)

## 2017-05-24 LAB — VITAMIN B1: VITAMIN B1 (THIAMINE): 17 nmol/L (ref 8–30)

## 2017-05-26 ENCOUNTER — Encounter: Payer: Self-pay | Admitting: Family Medicine

## 2017-07-07 ENCOUNTER — Telehealth: Payer: Self-pay

## 2017-07-07 NOTE — Telephone Encounter (Signed)
Received PA request for Temazepam. PA submitted & pending. [id number]

## 2017-07-07 NOTE — Telephone Encounter (Signed)
PA denied. Current insomnia agents post limit criteria cover up to 15 capsules per month of Restoril.

## 2017-07-08 NOTE — Telephone Encounter (Signed)
Please inform patient of rx limits

## 2017-07-08 NOTE — Telephone Encounter (Signed)
Called and left a voicemail message asking for a return phone call 

## 2017-07-09 NOTE — Telephone Encounter (Signed)
Called and spoke to patient who had already been informed by the pharmacist. She stated that it was fine as she does not take it regularly

## 2017-12-21 ENCOUNTER — Telehealth: Payer: Self-pay | Admitting: Family Medicine

## 2017-12-21 NOTE — Telephone Encounter (Signed)
MEDICATION: Temazepam 15 mg capsule Take 1 capsule by mouth at bedtime as needed for sleep  PHARMACY:   [place], [place] [phone] (Phone) [phone] (Fax)     IS THIS A 90 DAY SUPPLY : no  IS PATIENT OUT OF MEDICATION:   IF NOT; HOW MUCH IS LEFT:   LAST APPOINTMENT DATE: @06 /14/18  NEXT APPOINTMENT DATE:@Visit  date not found  OTHER COMMENTS:    **Let patient know to contact pharmacy at the end of the day to make sure medication is ready. **  ** Please notify patient to allow 48-72 hours to process**  **Encourage patient to contact the pharmacy for refills or they can request refills through [facility]**

## 2017-12-23 ENCOUNTER — Other Ambulatory Visit: Payer: Self-pay

## 2017-12-23 MED ORDER — TEMAZEPAM 15 MG PO CAPS: 15.0000 mg | ORAL_CAPSULE | Freq: Every evening | ORAL | 1 refills | Status: DC | PRN

## 2017-12-23 NOTE — Telephone Encounter (Signed)
Prescription sent to pharmacy.

## 2018-01-04 LAB — HM MAMMOGRAPHY

## 2018-01-04 LAB — HM PAP SMEAR: HM Pap smear: NEGATIVE

## 2018-05-04 ENCOUNTER — Other Ambulatory Visit: Payer: Self-pay | Admitting: Family Medicine

## 2018-05-04 NOTE — Telephone Encounter (Signed)
Jonni Sanger from [facility] called and said that they could not take the printer rx because it said void on it. Can Dr. Yong Channel please send or call in the medicatio temazepam (RESTORIL) 15 MG capsule to them.

## 2018-05-05 MED ORDER — TEMAZEPAM 15 MG PO CAPS: ORAL_CAPSULE | ORAL | 0 refills | Status: DC

## 2018-05-05 NOTE — Telephone Encounter (Signed)
I sent in #30- I did not do any refills-we see her next month

## 2018-05-05 NOTE — Addendum Note (Signed)
Addended by: Marin Olp on: 05/05/2018 06:00 PM   Modules accepted: Orders

## 2018-05-31 ENCOUNTER — Encounter: Payer: BC Managed Care – PPO | Admitting: Family Medicine

## 2018-06-01 ENCOUNTER — Encounter: Payer: Self-pay | Admitting: Family Medicine

## 2018-06-01 ENCOUNTER — Ambulatory Visit (INDEPENDENT_AMBULATORY_CARE_PROVIDER_SITE_OTHER): Payer: BC Managed Care – PPO | Admitting: Family Medicine

## 2018-06-01 VITALS — BP 128/76 | HR 61 | Temp 98.0°F | Ht 66.0 in | Wt 212.0 lb

## 2018-06-01 DIAGNOSIS — K219 Gastro-esophageal reflux disease without esophagitis: Secondary | ICD-10-CM

## 2018-06-01 DIAGNOSIS — F5101 Primary insomnia: Secondary | ICD-10-CM | POA: Diagnosis not present

## 2018-06-01 DIAGNOSIS — Z0000 Encounter for general adult medical examination without abnormal findings: Secondary | ICD-10-CM

## 2018-06-01 DIAGNOSIS — N390 Urinary tract infection, site not specified: Secondary | ICD-10-CM

## 2018-06-01 DIAGNOSIS — Z9884 Bariatric surgery status: Secondary | ICD-10-CM

## 2018-06-01 DIAGNOSIS — E785 Hyperlipidemia, unspecified: Secondary | ICD-10-CM | POA: Diagnosis not present

## 2018-06-01 NOTE — Assessment & Plan Note (Signed)
[facility]- follows with for  recurrent uti. Done with study but still follows up. Last week of her daily 4 months of trimethoprim. May be using new rx that makes bacteria not adhere to cell wall

## 2018-06-01 NOTE — Patient Instructions (Addendum)
Sign release of information at the check out desk for last mammogram and pap smear from gynecology as well as last bone density  Thrilled you are going back to weight watchers. Lets check in 6 months from now given the swing up in weight. Would love to see you trim 5-10 lbs back off at least in that time frame.   Please stop by lab before you go

## 2018-06-01 NOTE — Assessment & Plan Note (Signed)
HLD- update lipids, though was mildly elevated in past, looked great last year- hopeful not worsening with weight gain

## 2018-06-01 NOTE — Progress Notes (Signed)
Phone: [phone]  Subjective:  Patient presents today for their annual physical. Chief complaint-noted.   See problem oriented charting- ROS- full  review of systems was completed and negative except for: joint pain  The following were reviewed and entered/updated in epic: Past Medical History:  Diagnosis Date  . Allergy   . Anemia, iron deficiency   . B12 deficiency   . CAP (community acquired pneumonia) 11/18/2014  . GERD (gastroesophageal reflux disease)   . Iron deficiency   . Osteoarthritis   . Osteopenia   . Solitary pulmonary nodule 12/05/2014   Dx 11/19/14  3 mm > f/u 11/27/2015 > resolved, no further studies rec    . Urinary tract infection    Patient Active Problem List   Diagnosis Date Noted  . Recurrent UTI 04/07/2016    Priority: High  . Hyperlipidemia 05/20/2017    Priority: Medium  . History of adenomatous polyp of colon 10/12/2016    Priority: Medium  . Rosacea 08/30/2014    Priority: Medium  . Insomnia 08/30/2014    Priority: Medium  . Bariatric surgery status 09/01/2010    Priority: Medium  . 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 Plantar fasciitis, right 12/13/2014    Priority: Low  . Solitary pulmonary nodule 12/05/2014    Priority: Low  . CAP (community acquired pneumonia) 11/18/2014    Priority: Low  . Family history of hypertrophic cardiomyopathy 05/13/2011    Priority: Low  . Actinic keratosis 10/22/2010    Priority: Low  . 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 ALLERGIC RHINITIS 03/01/2008    Priority: Low  . ANEMIA-IRON DEFICIENCY 11/21/2007    Priority: Low  . ANEMIA, B12 DEFICIENCY 08/15/2007    Priority: Low  . GERD 06/02/2007    Priority: Low  . PSORIASIS 06/02/2007    Priority: Low  . OSTEOARTHRITIS 06/02/2007    Priority: Low  . OSTEOPENIA 06/02/2007    Priority: Low   Past Surgical History:  Procedure Laterality Date  . carpal tunnel right    . COSMETIC  SURGERY     tummy tuck  . GASTRIC BYPASS  2001    Family History  Problem Relation Age of Onset  . Heart disease Father   . Lung cancer Mother        stage IV, smoker  . Heart disease Sister        Hypertrophic cardiomyopathy  . Colon cancer Neg Hx     Medications- reviewed and updated Current Outpatient Medications  Medication Sig Dispense Refill  . calcium carbonate 200 MG capsule Take 3,000 mg by mouth daily.    Marland Kitchen estradiol (ESTRACE) 2 MG tablet Take 1 mg by mouth daily.     . Multiple Vitamin (MULTIVITAMIN) capsule Take 1 capsule by mouth daily.      Marland Kitchen omeprazole (PRILOSEC) 20 MG capsule Take 1 capsule (20 mg total) by mouth daily. 30 capsule 3  . progesterone (PROMETRIUM) 100 MG capsule Take 1 capsule by mouth daily.    . temazepam (RESTORIL) 15 MG capsule TAKE ONE CAPSULE (15MG  TOTAL) BY MOUTH AT BEDTIME AS NEEDED FOR SLEEP 30 capsule 0  . valACYclovir (VALTREX) 1000 MG tablet Take 2 pills twice a day for 1 day at first sign of cold sore 30 tablet 1   No current facility-administered medications for this visit.     Allergies-reviewed and updated No Known Allergies  Social History   Social History  Narrative   Married to [employer]. 2 kids. Jenny Reichmann (his wife had TBI being struck by car) with daughter [name]. Patient takes care of [name] and son lives with her and husband) and Colletta Maryland lives in [place] with no kids.       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  Raising granddaughter after daughter has TBI.       Hobbies: spending time with friends and family          Objective: BP 128/76 (BP Location: Left Arm, Patient Position: Sitting, Cuff Size: Large)   Pulse 61   Temp 98 F (36.7 C) (Oral)   Ht 5\' 6"  (1.676 m)   Wt 212 lb (96.2 kg)   SpO2 98%   BMI 34.22 kg/m  Gen: NAD, resting comfortably HEENT: Mucous membranes are moist. Oropharynx normal Neck: no  thyromegaly CV: RRR no murmurs rubs or gallops Lungs: CTAB no crackles, wheeze, rhonchi Abdomen: soft/nontender/nondistended/normal bowel sounds. No rebound or guarding.  Ext: trace edema Skin: warm, dry Neuro: grossly normal, moves all extremities, PERRLA  Assessment/Plan:  62 y.o. female presenting for annual physical.  Health Maintenance counseling: 1. Anticipatory guidance: Patient counseled regarding regular dental exams -q6 months, eye exams - yearly, wearing seatbelts.  2. Risk factor reduction:  Advised patient of need for regular exercise and diet rich and fruits and vegetables to reduce risk of heart attack and stroke. Exercise- struggling lately- fell off wagon and agrees to restart this. Diet-weight continues to creep up- up 22 lbs over last year- encouraged return to weight watchers again- she is already scheduled for friday.  Wt Readings from Last 3 Encounters:  06/01/18 212 lb (96.2 kg)  05/20/17 201 lb 9.6 oz (91.4 kg)  12/04/16 200 lb (90.7 kg)  3. Immunizations/screenings/ancillary studies- shingrix declines for now- didn't have chicken pox at that time.  Immunization History  Administered Date(s) Administered  . Influenza Split 10/03/2012  . Influenza Whole 09/06/2001, 10/12/2007, 09/25/2008, 09/01/2010  . Influenza,inj,Quad PF,6+ Mos 08/28/2013, 08/30/2014  . Influenza-Unspecified 09/21/2015, 08/07/2016  . Td 06/07/2003  . Tdap 12/10/2015  4. Cervical cancer screening- 12/30/16 last on record- also had one 2019 5. Breast cancer screening-  breast exam with gyn and mammogram 12/2017- get records 6. Colon cancer screening - 10/02/16 with 3 year follow up due to polyp history 7. Skin cancer screening- Dr. Wilhemina Bonito planned visit end of august. advised regular sunscreen use. Denies worrisome, changing, or new skin lesions.  8. Birth control/STD check- postmenopausal/monogamous  9. Osteoporosis screening at 68- has done at GYN- will get records  Status of chronic or  acute concerns  Negative depression screen  History of bariatric surgery- weight trending up unfortunately- see above discussion.  Update bariatric labs. In past Vitamin D deficiency- on multivitamin including vitamin D- thinks shes on a good dose. Has had some fatigue.   Some pain in right hip and left knee. Also some pain left 2nd mcp joint. Monitor only. Mild help with topicals.    Cold sores- sparing valtrex  HRT through gynecology- getting records for several HM items as above  Insomnia Insomnia- temazepam less than once a week (1-2x a month or less- mainly with travel)  Hyperlipidemia HLD- update lipids, though was mildly elevated in past, looked great last year- hopeful not worsening with weight gain  Recurrent UTI [facility]- follows with for  recurrent uti. Done with study but  still follows up. Last week of her daily 4 months of trimethoprim. May be using new rx that makes bacteria not adhere to cell wall  GERD Occasional reflux at night (can present as burning at night). Not using omeprazole as much. Moved HRT to AM and that seems to have helped. Wants to keep on list as she uses omeprazole sparingly.   6 months advised- weight loss goal 5-10 lbs  Lab/Order associations: Preventative health care - Plan: CBC, Comprehensive metabolic panel, Vitamin B33, VITAMIN D 25 Hydroxy (Vit-D Deficiency, Fractures), PTH, intact and calcium, Vitamin B1, Folate RBC, Zinc, Copper, serum, Iron, TIBC and Ferritin Panel, Lipid panel  Hyperlipidemia, unspecified hyperlipidemia type - Plan: CBC, Comprehensive metabolic panel, Lipid panel  Bariatric surgery status - Plan: Vitamin B12, VITAMIN D 25 Hydroxy (Vit-D Deficiency, Fractures), PTH, intact and calcium, Vitamin B1, Folate RBC, Zinc, Copper, serum, Iron, TIBC and Ferritin Panel  Primary insomnia  Recurrent UTI  Gastroesophageal reflux disease without esophagitis  Return precautions advised.  [name], MD

## 2018-06-01 NOTE — Assessment & Plan Note (Signed)
Occasional reflux at night (can present as burning at night). Not using omeprazole as much. Moved HRT to AM and that seems to have helped. Wants to keep on list as she uses omeprazole sparingly.

## 2018-06-01 NOTE — Assessment & Plan Note (Signed)
Insomnia- temazepam less than once a week (1-2x a month or less- mainly with travel)

## 2018-06-02 LAB — CBC
HCT: 38.3 % (ref 36.0–46.0)
Hemoglobin: 13.1 g/dL (ref 12.0–15.0)
MCHC: 34.2 g/dL (ref 30.0–36.0)
MCV: 93.1 fl (ref 78.0–100.0)
Platelets: 190 10*3/uL (ref 150.0–400.0)
RBC: 4.11 Mil/uL (ref 3.87–5.11)
RDW: 12.2 % (ref 11.5–15.5)
WBC: 5.4 10*3/uL (ref 4.0–10.5)

## 2018-06-02 LAB — LIPID PANEL
CHOL/HDL RATIO: 3
Cholesterol: 180 mg/dL (ref 0–200)
HDL: 70.2 mg/dL (ref >39.00)
LDL CALC: 93 mg/dL (ref 0–99)
NONHDL: 109.91
TRIGLYCERIDES: 84 mg/dL (ref 0.0–149.0)
VLDL: 16.8 mg/dL (ref 0.0–40.0)

## 2018-06-02 LAB — COMPREHENSIVE METABOLIC PANEL
ALBUMIN: 4 g/dL (ref 3.5–5.2)
ALK PHOS: 74 U/L (ref 39–117)
ALT: 18 U/L (ref 0–35)
AST: 19 U/L (ref 0–37)
BUN: 15 mg/dL (ref 6–23)
CO2: 30 mEq/L (ref 19–32)
Calcium: 9.2 mg/dL (ref 8.4–10.5)
Chloride: 102 mEq/L (ref 96–112)
Creatinine, Ser: 0.67 mg/dL (ref 0.40–1.20)
GFR: 94.06 mL/min (ref >60.00)
Glucose, Bld: 96 mg/dL (ref 70–99)
Potassium: 4 mEq/L (ref 3.5–5.1)
SODIUM: 138 meq/L (ref 135–145)
TOTAL PROTEIN: 6.7 g/dL (ref 6.0–8.3)
Total Bilirubin: 0.4 mg/dL (ref 0.2–1.2)

## 2018-06-02 LAB — VITAMIN D 25 HYDROXY (VIT D DEFICIENCY, FRACTURES): VITD: 33.83 ng/mL (ref 30.00–100.00)

## 2018-06-02 LAB — VITAMIN B12: VITAMIN B 12: 379 pg/mL (ref 211–911)

## 2018-06-04 LAB — IRON,TIBC AND FERRITIN PANEL
%SAT: 29 % (ref 16–45)
FERRITIN: 90 ng/mL (ref 16–288)
Iron: 92 ug/dL (ref 45–160)
TIBC: 314 mcg/dL (calc) (ref 250–450)

## 2018-06-04 LAB — ZINC: Zinc: 50 ug/dL — ABNORMAL LOW (ref 60–130)

## 2018-06-04 LAB — PTH, INTACT AND CALCIUM
Calcium: 9.1 mg/dL (ref 8.6–10.4)
PTH: 65 pg/mL — ABNORMAL HIGH (ref 14–64)

## 2018-06-04 LAB — VITAMIN B1

## 2018-06-04 LAB — COPPER, SERUM: Copper: 158 ug/dL (ref 70–175)

## 2018-06-04 LAB — FOLATE RBC: RBC Folate: 569 ng/mL RBC (ref >280)

## 2018-06-06 ENCOUNTER — Encounter: Payer: Self-pay | Admitting: Family Medicine

## 2018-06-23 ENCOUNTER — Other Ambulatory Visit: Payer: Self-pay | Admitting: Family Medicine

## 2018-06-23 ENCOUNTER — Telehealth: Payer: Self-pay

## 2018-06-23 NOTE — Telephone Encounter (Signed)
Copied from [place] [id number]. Topic: General - Other >> Jun 23, 2018  2:03 PM Yvette Rack wrote: Reason for CRM: [facility], [place] [phone] (Phone) [phone] (Fax) calling stating that pt wanted a 90 supply on the omeprazole (PRILOSEC) 20 MG capsule and the office had resent the request back to them as 30 day supply they had refaxed it about 30-45 min

## 2018-06-23 NOTE — Telephone Encounter (Deleted)
Please advise 

## 2018-06-24 ENCOUNTER — Other Ambulatory Visit: Payer: Self-pay

## 2018-06-24 MED ORDER — OMEPRAZOLE 20 MG PO CPDR: DELAYED_RELEASE_CAPSULE | ORAL | 3 refills | Status: DC

## 2018-11-12 ENCOUNTER — Other Ambulatory Visit: Payer: Self-pay | Admitting: Family Medicine

## 2018-11-15 NOTE — Telephone Encounter (Signed)
Last OV 06/01/18 Last refill 05/05/18 #30/0 Last refill 12/13/2018

## 2018-12-05 ENCOUNTER — Ambulatory Visit: Payer: BC Managed Care – PPO | Admitting: Family Medicine

## 2018-12-13 ENCOUNTER — Encounter: Payer: Self-pay | Admitting: Family Medicine

## 2018-12-13 ENCOUNTER — Ambulatory Visit (INDEPENDENT_AMBULATORY_CARE_PROVIDER_SITE_OTHER): Payer: Medicare Other | Admitting: Family Medicine

## 2018-12-13 VITALS — BP 108/86 | HR 60 | Temp 97.4°F | Ht 66.0 in | Wt 206.4 lb

## 2018-12-13 DIAGNOSIS — F5101 Primary insomnia: Secondary | ICD-10-CM | POA: Diagnosis not present

## 2018-12-13 DIAGNOSIS — J329 Chronic sinusitis, unspecified: Secondary | ICD-10-CM | POA: Diagnosis not present

## 2018-12-13 DIAGNOSIS — B9789 Other viral agents as the cause of diseases classified elsewhere: Secondary | ICD-10-CM

## 2018-12-13 DIAGNOSIS — Z6833 Body mass index (BMI) 33.0-33.9, adult: Secondary | ICD-10-CM | POA: Diagnosis not present

## 2018-12-13 DIAGNOSIS — E785 Hyperlipidemia, unspecified: Secondary | ICD-10-CM

## 2018-12-13 DIAGNOSIS — K219 Gastro-esophageal reflux disease without esophagitis: Secondary | ICD-10-CM | POA: Diagnosis not present

## 2018-12-13 DIAGNOSIS — F419 Anxiety disorder, unspecified: Secondary | ICD-10-CM | POA: Insufficient documentation

## 2018-12-13 MED ORDER — TEMAZEPAM 15 MG PO CAPS: ORAL_CAPSULE | ORAL | 0 refills | Status: DC

## 2018-12-13 MED ORDER — BUSPIRONE HCL 7.5 MG PO TABS: 7.5000 mg | ORAL_TABLET | Freq: Two times a day (BID) | ORAL | 1 refills | Status: DC | PRN

## 2018-12-13 NOTE — Progress Notes (Signed)
Subjective:  Alexis Wallace is a 63 y.o. year old very pleasant female patient who presents for/with See problem oriented charting ROS-has some sinus pressure, sneezing, coughing, mild sore throat.  No chest pain or shortness of breath reported.  Past Medical History-  Patient Active Problem List   Diagnosis Date Noted  . Recurrent UTI 04/07/2016    Priority: High  . Anxiety 12/13/2018    Priority: Medium  . Hyperlipidemia 05/20/2017    Priority: Medium  . History of adenomatous polyp of colon 10/12/2016    Priority: Medium  . Rosacea 08/30/2014    Priority: Medium  . Insomnia 08/30/2014    Priority: Medium  . Bariatric surgery status 09/01/2010    Priority: Medium  . 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 Plantar fasciitis, right 12/13/2014    Priority: Low  . Solitary pulmonary nodule 12/05/2014    Priority: Low  . CAP (community acquired pneumonia) 11/18/2014    Priority: Low  . Family history of hypertrophic cardiomyopathy 05/13/2011    Priority: Low  . Actinic keratosis 10/22/2010    Priority: Low  . 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 ALLERGIC RHINITIS 03/01/2008    Priority: Low  . ANEMIA-IRON DEFICIENCY 11/21/2007    Priority: Low  . ANEMIA, B12 DEFICIENCY 08/15/2007    Priority: Low  . GERD 06/02/2007    Priority: Low  . PSORIASIS 06/02/2007    Priority: Low  . OSTEOARTHRITIS 06/02/2007    Priority: Low  . OSTEOPENIA 06/02/2007    Priority: Low    Medications- reviewed and updated Current Outpatient Medications  Medication Sig Dispense Refill  . calcium carbonate 200 MG capsule Take 3,000 mg by mouth daily.    Marland Kitchen estradiol (ESTRACE) 2 MG tablet Take 1 mg by mouth daily.     . methenamine (HIPREX) 1 g tablet Take by mouth.    . Multiple Vitamin (MULTIVITAMIN) capsule Take 1 capsule by mouth daily.      Marland Kitchen omeprazole (PRILOSEC) 20 MG capsule TAKE ONE (1) CAPSULE EACH DAY 90 capsule 3  .  progesterone (PROMETRIUM) 100 MG capsule Take 1 capsule by mouth daily.    . temazepam (RESTORIL) 15 MG capsule TAKE ONE CAPSULE (15 MG TOTAL) BY MOUTH AT BEDTIME AS NEEDED FOR SLEEP. 30 capsule 0  . valACYclovir (VALTREX) 1000 MG tablet Take 2 pills twice a day for 1 day at first sign of cold sore 30 tablet 1   No current facility-administered medications for this visit.     Objective: BP 108/86 (BP Location: Left Arm, Patient Position: Sitting, Cuff Size: Large)   Pulse 60   Temp (!) 97.4 F (36.3 C) (Oral)   Ht 5\' 6"  (1.676 m)   Wt 206 lb 6.4 oz (93.6 kg)   SpO2 98%   BMI 33.31 kg/m  Gen: NAD, resting comfortably Tympanic membrane is normal, erythematous nasal turbinates with clear discharge, pharynx with mild erythema CV: RRR no murmurs rubs or gallops Lungs: CTAB no crackles, wheeze, rhonchi Ext: no edema Skin: warm, dry  Assessment/Plan:  Other notes: 1.  Patient with history of bariatric surgery-did full labs June 2019.  Weight is down 6 pounds from last visit-congratulated patient on effort. She is using weight watchers to help. Has not been exercising regularly- barriers including granddaughter living with her and then grandson 3 days a week caring for.   2.  Some splitting of skin on thumbs particularly during  the winter-we discussed trial of emollient twice a day-could use hydrocortisone for up to a week as needed as well  Viral sinusitis  s: some sinus congestion and ear pressure for a few days. Then today started with more sneezing and runny nose.  Day 4 of illness today A/P: We discussed too early to diagnoses bacterial sinusitis-lungs are clear today.  I did tell her I would send in an antibiotic such as Augmentin if her symptoms are persistent another week  Insomnia S: Patient continues on temazepam approximately 1-2 times a month particularly with travel or higher stress  She is doing well and getting adequate sleep with this A/P: Doing well-continue current  medications  Hyperlipidemia S: Well controlled on no statin on last labs Lab Results  Component Value Date   CHOL 180 06/01/2018   HDL 70.20 06/01/2018   LDLCALC 93 06/01/2018   LDLDIRECT 84.0 12/10/2015   TRIG 84.0 06/01/2018   CHOLHDL 3 06/01/2018   A/P: Has been diet controlled recently- continue without medication.  Repeat June 02, 2019 or later  GERD  S: Patient gets occasional reflux at night-uses omeprazole when this occurs. Using about twice a week with good relief A/P:  Stable - continue current meds-  May need less frequently as continues on healthy lifestyle journey  Anxiety S: patient dealing with a fair amount of stress- feels like she has a lot of things that she is juggling. Goes to see niece in dental school and has fair amount of phobia with that. She has tried her friend's xanax and very helpful with dental visit A/P: Patient asks for trial of xanax- we discussed using something lower risk like buspirone first- she is agreeable and sent this in for her.    Return in about 6 months (around 06/13/2019) for physical.  Lab/Order associations: Anxiety  Primary insomnia  Hyperlipidemia, unspecified hyperlipidemia type  Gastroesophageal reflux disease without esophagitis  Viral sinusitis  BMI 33.0-33.9,adult  Meds ordered this encounter  . temazepam (RESTORIL) 15 MG capsule    Sig: TAKE ONE CAPSULE (15 MG TOTAL) BY MOUTH AT BEDTIME AS NEEDED FOR SLEEP.    Dispense:  30 capsule    Refill:  0  . busPIRone (BUSPAR) 7.5 MG tablet    Sig: Take 1 tablet (7.5 mg total) by mouth 2 (two) times daily as needed (anxiety).    Dispense:  30 tablet    Refill:  1   Return precautions advised.  [name], MD

## 2018-12-13 NOTE — Assessment & Plan Note (Signed)
S: Well controlled on no statin on last labs Lab Results  Component Value Date   CHOL 180 06/01/2018   HDL 70.20 06/01/2018   LDLCALC 93 06/01/2018   LDLDIRECT 84.0 12/10/2015   TRIG 84.0 06/01/2018   CHOLHDL 3 06/01/2018   A/P: Has been diet controlled recently- continue without medication.  Repeat June 02, 2019 or later

## 2018-12-13 NOTE — Patient Instructions (Addendum)
Great job on 6 pounds weight loss!  Continue with weight watchers.  Would love to see you add exercise in for the short and long-term benefits- ask your brother!  Trial buspirone 7.5 mg as needed for anxiety-this is not as strong as Xanax but I think can provide some moderate relief of your symptoms.  If your sinus pressure persists another week please give [place] a call as we can call in an antibiotic for you such as Augmentin.  Symptoms over 10 days typically indicate a bacterial superinfection.  I would also know if you have fever or shortness of breath.

## 2018-12-13 NOTE — Assessment & Plan Note (Signed)
S: Patient gets occasional reflux at night-uses omeprazole when this occurs. Using about twice a week with good relief A/P:  Stable - continue current meds-  May need less frequently as continues on healthy lifestyle journey

## 2018-12-13 NOTE — Assessment & Plan Note (Signed)
S: patient dealing with a fair amount of stress- feels like she has a lot of things that she is juggling. Goes to see niece in dental school and has fair amount of phobia with that. She has tried her friend's xanax and very helpful with dental visit A/P: Patient asks for trial of xanax- we discussed using something lower risk like buspirone first- she is agreeable and sent this in for her.

## 2018-12-13 NOTE — Assessment & Plan Note (Signed)
S: Patient continues on temazepam approximately 1-2 times a month particularly with travel or higher stress  She is doing well and getting adequate sleep with this A/P: Doing well-continue current medications

## 2018-12-23 DIAGNOSIS — R8290 Unspecified abnormal findings in urine: Secondary | ICD-10-CM | POA: Diagnosis not present

## 2019-01-11 DIAGNOSIS — N8500 Endometrial hyperplasia, unspecified: Secondary | ICD-10-CM | POA: Diagnosis not present

## 2019-01-11 DIAGNOSIS — Z6833 Body mass index (BMI) 33.0-33.9, adult: Secondary | ICD-10-CM | POA: Diagnosis not present

## 2019-01-11 DIAGNOSIS — Z1231 Encounter for screening mammogram for malignant neoplasm of breast: Secondary | ICD-10-CM | POA: Diagnosis not present

## 2019-01-11 DIAGNOSIS — N83202 Unspecified ovarian cyst, left side: Secondary | ICD-10-CM | POA: Diagnosis not present

## 2019-01-11 DIAGNOSIS — Z124 Encounter for screening for malignant neoplasm of cervix: Secondary | ICD-10-CM | POA: Diagnosis not present

## 2019-01-11 DIAGNOSIS — Z01419 Encounter for gynecological examination (general) (routine) without abnormal findings: Secondary | ICD-10-CM | POA: Diagnosis not present

## 2019-01-12 LAB — HM MAMMOGRAPHY

## 2019-01-16 LAB — HM PAP SMEAR: HM Pap smear: NEGATIVE

## 2019-01-23 ENCOUNTER — Ambulatory Visit (INDEPENDENT_AMBULATORY_CARE_PROVIDER_SITE_OTHER): Payer: Medicare Other | Admitting: Physician Assistant

## 2019-01-23 ENCOUNTER — Encounter: Payer: Self-pay | Admitting: Physician Assistant

## 2019-01-23 VITALS — BP 148/90 | HR 69 | Temp 98.6°F | Ht 66.0 in | Wt 207.0 lb

## 2019-01-23 DIAGNOSIS — R6889 Other general symptoms and signs: Secondary | ICD-10-CM | POA: Diagnosis not present

## 2019-01-23 LAB — POCT INFLUENZA A/B
INFLUENZA A, POC: NEGATIVE
Influenza B, POC: NEGATIVE

## 2019-01-23 MED ORDER — FLUTICASONE PROPIONATE 50 MCG/ACT NA SUSP: 2.0000 | Freq: Every day | NASAL | 2 refills | Status: DC

## 2019-01-23 MED ORDER — AMOXICILLIN-POT CLAVULANATE 875-125 MG PO TABS: 1.0000 | ORAL_TABLET | Freq: Two times a day (BID) | ORAL | 0 refills | Status: DC

## 2019-01-23 NOTE — Patient Instructions (Signed)
It was great to see you!  Use medication as prescribed: Augmentin oral antibiotic and flonase nasal spray  Push fluids and get plenty of rest. Please return if you are not improving as expected, or if you have high fevers (>101.5) or difficulty swallowing or worsening productive cough.  Call clinic with questions.  I hope you start feeling better soon!

## 2019-01-23 NOTE — Progress Notes (Signed)
Alexis Wallace is a 63 y.o. female here for a new problem.  I acted as a Education administrator for [employer], PA-C Anselmo Pickler, LPN  History of Present Illness:   Chief Complaint  Patient presents with  . Sinus Problem    Happening since Jan  . Headache    Sinus Problem  This is a new problem. Episode onset: Started in Jan has been off and on but has worsened this week. The problem has been gradually worsening since onset. Maximum temperature: 100.1 earlier today. Her pain is at a severity of 6/10. The pain is moderate. Associated symptoms include chills, congestion (Nasal and chest congestion, yellow/clear nasal drainage sometimes blood tinged), coughing, headaches, a hoarse voice, sinus pressure, sneezing and a sore throat. Pertinent negatives include no ear pain, neck pain or shortness of breath. Past treatments include acetaminophen and spray decongestants (Severe cold and flu, Afrin). The treatment provided no relief.    Past Medical History:  Diagnosis Date  . Allergy   . Anemia, iron deficiency   . B12 deficiency   . CAP (community acquired pneumonia) 11/18/2014  . GERD (gastroesophageal reflux disease)   . Iron deficiency   . Osteoarthritis   . Osteopenia   . Solitary pulmonary nodule 12/05/2014   Dx 11/19/14  3 mm > f/u 11/27/2015 > resolved, no further studies rec    . Urinary tract infection      Social History   Socioeconomic History  . Marital status: Married    Spouse name: Not on file  . Number of children: Not on file  . Years of education: Not on file  . Highest education level: Not on file  Occupational History  . Not on file  Social Needs  . Financial resource strain: Not on file  . Food insecurity:    Worry: Not on file    Inability: Not on file  . Transportation needs:    Medical: Not on file    Non-medical: Not on file  Tobacco Use  . Smoking status: Former Smoker    Packs/day: 1.00    Years: 5.00    Pack years: 5.00    Types: Cigarettes   Last attempt to quit: 02/10/1978    Years since quitting: 40.9  . Smokeless tobacco: Never Used  Substance and Sexual Activity  . Alcohol use: No  . Drug use: No  . Sexual activity: Yes  Lifestyle  . Physical activity:    Days per week: Not on file    Minutes per session: Not on file  . Stress: Not on file  Relationships  . Social connections:    Talks on phone: Not on file    Gets together: Not on file    Attends religious service: Not on file    Active member of club or organization: Not on file    Attends meetings of clubs or organizations: Not on file    Relationship status: Not on file  . Intimate partner violence:    Fear of current or ex partner: Not on file    Emotionally abused: Not on file    Physically abused: Not on file    Forced sexual activity: Not on file  Other Topics Concern  . Not on file  Social History Narrative   Married to [employer]. 2 kids. Jenny Reichmann (his wife had TBI being struck by car) with daughter [name]. Patient takes care of [name] and son lives with her and husband) and Colletta Maryland lives in [place] with  no kids.       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  Raising granddaughter after daughter has TBI.       Hobbies: spending time with friends and family          Past Surgical History:  Procedure Laterality Date  . carpal tunnel right    . COSMETIC SURGERY     tummy tuck  . GASTRIC BYPASS  2001    Family History  Problem Relation Age of Onset  . Heart disease Father   . Lung cancer Mother        stage IV, smoker  . Heart disease Sister        Hypertrophic cardiomyopathy  . Colon cancer Neg Hx     No Known Allergies  Current Medications:   Current Outpatient Medications:  .  busPIRone (BUSPAR) 7.5 MG tablet, Take 1 tablet (7.5 mg total) by mouth 2 (two) times daily as needed (anxiety)., Disp: 30 tablet, Rfl: 1 .  calcium carbonate 200  MG capsule, Take 3,000 mg by mouth daily., Disp: , Rfl:  .  estradiol (ESTRACE) 2 MG tablet, Take 1 mg by mouth daily. , Disp: , Rfl:  .  Multiple Vitamin (MULTIVITAMIN) capsule, Take 1 capsule by mouth daily.  , Disp: , Rfl:  .  omeprazole (PRILOSEC) 20 MG capsule, TAKE ONE (1) CAPSULE EACH DAY, Disp: 90 capsule, Rfl: 3 .  progesterone (PROMETRIUM) 100 MG capsule, Take 1 capsule by mouth daily., Disp: , Rfl:  .  pseudoephedrine-acetaminophen (TYLENOL SINUS) 30-500 MG TABS tablet, Take 1 tablet by mouth every 4 (four) hours as needed., Disp: , Rfl:  .  temazepam (RESTORIL) 15 MG capsule, TAKE ONE CAPSULE (15 MG TOTAL) BY MOUTH AT BEDTIME AS NEEDED FOR SLEEP., Disp: 30 capsule, Rfl: 0 .  valACYclovir (VALTREX) 1000 MG tablet, Take 2 pills twice a day for 1 day at first sign of cold sore, Disp: 30 tablet, Rfl: 1 .  amoxicillin-clavulanate (AUGMENTIN) 875-125 MG tablet, Take 1 tablet by mouth 2 (two) times daily., Disp: 20 tablet, Rfl: 0 .  fluticasone (FLONASE) 50 MCG/ACT nasal spray, Place 2 sprays into both nostrils daily., Disp: 16 g, Rfl: 2   Review of Systems:   Review of Systems  Constitutional: Positive for chills.  HENT: Positive for congestion (Nasal and chest congestion, yellow/clear nasal drainage sometimes blood tinged), hoarse voice, sinus pressure, sneezing and sore throat. Negative for ear pain.   Respiratory: Positive for cough. Negative for shortness of breath.   Musculoskeletal: Negative for neck pain.  Neurological: Positive for headaches.    Vitals:   Vitals:   01/23/19 1541  BP: (!) 148/90  Pulse: 69  Temp: 98.6 F (37 C)  TempSrc: Oral  SpO2: 98%  Weight: 207 lb (93.9 kg)  Height: 5\' 6"  (1.676 m)     Body mass index is 33.41 kg/m.  Physical Exam:   Physical Exam Vitals signs and nursing note reviewed.  Constitutional:      General: She is not in acute distress.    Appearance: She is well-developed. She is not ill-appearing or toxic-appearing.  HENT:      Head: Normocephalic and atraumatic.     Right Ear: Ear canal and external ear normal. A middle ear effusion is present. Tympanic membrane is not erythematous, retracted or bulging.     Left Ear: Ear canal and external ear normal. A middle ear  effusion is present. Tympanic membrane is not erythematous, retracted or bulging.     Nose: Mucosal edema, congestion and rhinorrhea present.     Right Sinus: Maxillary sinus tenderness present. No frontal sinus tenderness.     Left Sinus: Maxillary sinus tenderness present. No frontal sinus tenderness.     Mouth/Throat:     Pharynx: Uvula midline. No posterior oropharyngeal erythema.  Eyes:     General: Lids are normal.     Conjunctiva/sclera: Conjunctivae normal.  Neck:     Trachea: Trachea normal.  Cardiovascular:     Rate and Rhythm: Normal rate and regular rhythm.     Heart sounds: Normal heart sounds, S1 normal and S2 normal.  Pulmonary:     Effort: Pulmonary effort is normal.     Breath sounds: Normal breath sounds. No decreased breath sounds, wheezing, rhonchi or rales.  Lymphadenopathy:     Cervical: No cervical adenopathy.  Skin:    General: Skin is warm and dry.  Neurological:     Mental Status: She is alert.  Psychiatric:        Speech: Speech normal.        Behavior: Behavior normal. Behavior is cooperative.     Results for orders placed or performed in visit on 01/23/19  POCT Influenza A/B  Result Value Ref Range   Influenza A, POC Negative Negative   Influenza B, POC Negative Negative    Assessment and Plan:   Tallyn was seen today for sinus problem and headache.  Diagnoses and all orders for this visit:  Flu-like symptoms -     POCT Influenza A/B  Other orders -     amoxicillin-clavulanate (AUGMENTIN) 875-125 MG tablet; Take 1 tablet by mouth 2 (two) times daily. -     fluticasone (FLONASE) 50 MCG/ACT nasal spray; Place 2 sprays into both nostrils daily.   No red flags on exam.  Flu swab negative. Will  initiate Augmentin and Flonase per orders for sinusitis. Discussed taking medications as prescribed. Reviewed return precautions including worsening fever, SOB, worsening cough or other concerns. Push fluids and rest. I recommend that patient follow-up if symptoms worsen or persist despite treatment x 7-10 days, sooner if needed.   . Reviewed expectations re: course of current medical issues. . Discussed self-management of symptoms. . Outlined signs and symptoms indicating need for more acute intervention. . Patient verbalized understanding and all questions were answered. . See orders for this visit as documented in the electronic medical record. . Patient received an After-Visit Summary.  CMA or LPN served as scribe during this visit. History, Physical, and Plan performed by medical provider. The above documentation has been reviewed and is accurate and complete.  Inda Coke, PA-C

## 2019-02-09 ENCOUNTER — Telehealth: Payer: Self-pay | Admitting: Family Medicine

## 2019-02-09 MED ORDER — OSELTAMIVIR PHOSPHATE 75 MG PO CAPS: 75.0000 mg | ORAL_CAPSULE | Freq: Every day | ORAL | 0 refills | Status: DC

## 2019-02-09 NOTE — Telephone Encounter (Signed)
Meant to send to you team- sorry

## 2019-02-09 NOTE — Telephone Encounter (Signed)
Please advise 

## 2019-02-09 NOTE — Telephone Encounter (Addendum)
Copied from [place] [phone]. Topic: General - Other >> Feb 09, 2019  2:29 PM Lennox Solders wrote: Reason for CRM pt daughter stephanie who is also dr hunter patient is calling her son was dx with flu type A and grandmother sometime kept her son and she would like to know if dr hunter will send in tamiflu . Pt is not having any symptoms.  pharm

## 2019-02-09 NOTE — Telephone Encounter (Signed)
I would only plan treatment with Tamiflu if they are going to continue to watch the son at this point-if not treatment only if become ill.  How often were they with him?  When was the last exposure?

## 2019-02-09 NOTE — Telephone Encounter (Signed)
I sent in tamiflu after getting message back form daughter- please inform patient

## 2019-02-09 NOTE — Telephone Encounter (Signed)
See note

## 2019-02-10 DIAGNOSIS — N390 Urinary tract infection, site not specified: Secondary | ICD-10-CM | POA: Diagnosis not present

## 2019-02-18 ENCOUNTER — Encounter: Payer: Self-pay | Admitting: Family Medicine

## 2019-02-20 ENCOUNTER — Other Ambulatory Visit: Payer: Self-pay

## 2019-02-20 ENCOUNTER — Telehealth: Payer: Self-pay

## 2019-02-20 ENCOUNTER — Encounter: Payer: Self-pay | Admitting: Family Medicine

## 2019-02-20 ENCOUNTER — Other Ambulatory Visit: Payer: Self-pay | Admitting: Family Medicine

## 2019-02-20 ENCOUNTER — Ambulatory Visit (INDEPENDENT_AMBULATORY_CARE_PROVIDER_SITE_OTHER): Payer: Medicare Other | Admitting: Family Medicine

## 2019-02-20 VITALS — BP 132/90 | HR 58 | Temp 97.8°F | Ht 66.0 in | Wt 207.0 lb

## 2019-02-20 DIAGNOSIS — H9201 Otalgia, right ear: Secondary | ICD-10-CM | POA: Diagnosis not present

## 2019-02-20 MED ORDER — AMOXICILLIN-POT CLAVULANATE 875-125 MG PO TABS: 1.0000 | ORAL_TABLET | Freq: Two times a day (BID) | ORAL | 0 refills | Status: AC

## 2019-02-20 NOTE — Patient Instructions (Signed)
I do not see a rip roaring infection of right ear- there is perhaps mild increase in redness compared to the left ear. You have had a lot of congestion these winter months so this could be related to sinuses and poor drainage- we are going to try augmentin to see if that helps. I would often use prednisone in this case but we are not sure how that is interacting fully yet with covid 19. Can restart flonase in case there is an allergic element to this (which I suspect there is)  We originally talked about ENT referral but you didn't want to start there- lets do that if you do not improve in next 2 weeks. Let us know if new or worsening symptoms develop

## 2019-02-20 NOTE — Telephone Encounter (Signed)
Entered in error

## 2019-02-20 NOTE — Progress Notes (Signed)
Phone [phone]   Subjective:  Alexis Wallace is a 63 y.o. year old very pleasant female patient who presents for/with See problem oriented charting ROS- no fever/chills. No nausea or vomiting outside of severe pain on Saturday. No shortness of breath.    Past Medical History-  Patient Active Problem List   Diagnosis Date Noted  . Recurrent UTI 04/07/2016    Priority: High  . Anxiety 12/13/2018    Priority: Medium  . Hyperlipidemia 05/20/2017    Priority: Medium  . History of adenomatous polyp of colon 10/12/2016    Priority: Medium  . Rosacea 08/30/2014    Priority: Medium  . Insomnia 08/30/2014    Priority: Medium  . Bariatric surgery status 09/01/2010    Priority: Medium  . 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 Plantar fasciitis, right 12/13/2014    Priority: Low  . Solitary pulmonary nodule 12/05/2014    Priority: Low  . CAP (community acquired pneumonia) 11/18/2014    Priority: Low  . Family history of hypertrophic cardiomyopathy 05/13/2011    Priority: Low  . Actinic keratosis 10/22/2010    Priority: Low  . 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 ALLERGIC RHINITIS 03/01/2008    Priority: Low  . ANEMIA-IRON DEFICIENCY 11/21/2007    Priority: Low  . ANEMIA, B12 DEFICIENCY 08/15/2007    Priority: Low  . GERD 06/02/2007    Priority: Low  . PSORIASIS 06/02/2007    Priority: Low  . OSTEOARTHRITIS 06/02/2007    Priority: Low  . OSTEOPENIA 06/02/2007    Priority: Low    Medications- reviewed and updated Current Outpatient Medications  Medication Sig Dispense Refill  . busPIRone (BUSPAR) 7.5 MG tablet Take 1 tablet (7.5 mg total) by mouth 2 (two) times daily as needed (anxiety). 30 tablet 1  . calcium carbonate 200 MG capsule Take 3,000 mg by mouth daily.    Marland Kitchen estradiol (ESTRACE) 2 MG tablet Take 1 mg by mouth daily.     . fluticasone (FLONASE) 50 MCG/ACT nasal spray Place 2 sprays into both  nostrils daily. 16 g 2  . guaiFENesin (MUCINEX) 600 MG 12 hr tablet Take by mouth 2 (two) times daily.    Marland Kitchen ibuprofen (ADVIL,MOTRIN) 200 MG tablet Take 200 mg by mouth every 6 (six) hours as needed.    . Multiple Vitamin (MULTIVITAMIN) capsule Take 1 capsule by mouth daily.      Marland Kitchen omeprazole (PRILOSEC) 20 MG capsule TAKE ONE (1) CAPSULE EACH DAY 90 capsule 3  . progesterone (PROMETRIUM) 100 MG capsule Take 1 capsule by mouth daily.    . valACYclovir (VALTREX) 1000 MG tablet Take 2 pills twice a day for 1 day at first sign of cold sore 30 tablet 1  . amoxicillin-clavulanate (AUGMENTIN) 875-125 MG tablet Take 1 tablet by mouth 2 (two) times daily for 7 days. 14 tablet 0  . pseudoephedrine-acetaminophen (TYLENOL SINUS) 30-500 MG TABS tablet Take 1 tablet by mouth every 4 (four) hours as needed.    . temazepam (RESTORIL) 15 MG capsule TAKE ONE CAPSULE (15MG  TOTAL) BY MOUTH AT BEDTIME AS NEEDED FOR SLEEP 30 capsule 1   No current facility-administered medications for this visit.      Objective:  BP 132/90 (BP Location: Left Arm, Patient Position: Sitting, Cuff Size: Normal)   Pulse (!) 58   Temp 97.8 F (36.6 C) (Oral)   Ht 5\' 6"  (1.676 m)   Wt 207 lb (  93.9 kg)   SpO2 98%   BMI 33.41 kg/m  Gen: NAD, resting comfortably Mild erythema of right tympanic membrane when compared to the left.  Not bulging.  Patient is tender with otoscope in the ear.  Nasal turbinates erythematous with primarily clear discharge.  No obvious sinus tenderness CV: RRR no murmurs rubs or gallops Lungs: CTAB no crackles, wheeze, rhonchi Ext: no edema Skin: warm, dry Neuro: Gait and speech normal    Assessment and Plan   # Right ear pain S: Patient has been dealing with right ear pain for the last 3 days. First started with pain with sharp pain in jaw and ear area. Took 3 advil and mucinex DM and has some relief at present. When this wears off- she notes throbbing sensation every few seconds. Mild congestion in  sinuses but no cough.   Was seen for flu like illness mid February and prescribed augmentin due to having ear redness/concern for infection- was having mild pain at that time but that seemed to resolve.  Denies hearing loss A/P: I am concerned patient symptoms may be coming from otitis media with effusion- with that being said with mild increased erythema of right ear-she would like to try to have this treated as serous otitis media with antibiotics instead of being referred to ENT as per step.  I agree to trial this and patient will inform me if not improving and we will refer her to ENT-send in Augmentin.  She states she did reasonably well with this recently.  Future Appointments  Date Time Provider [place]  06/08/2019  8:40 AM Marin Olp, MD [facility]   Meds ordered this encounter  Medications  . amoxicillin-clavulanate (AUGMENTIN) 875-125 MG tablet    Sig: Take 1 tablet by mouth 2 (two) times daily for 7 days.    Dispense:  14 tablet    Refill:  0    Return precautions advised.  [name], MD

## 2019-02-22 NOTE — Telephone Encounter (Signed)
Pt came in for ov on 02/20/19 about this issue.

## 2019-02-23 ENCOUNTER — Other Ambulatory Visit: Payer: Self-pay | Admitting: Family Medicine

## 2019-04-17 DIAGNOSIS — H2513 Age-related nuclear cataract, bilateral: Secondary | ICD-10-CM | POA: Diagnosis not present

## 2019-04-17 DIAGNOSIS — H5213 Myopia, bilateral: Secondary | ICD-10-CM | POA: Diagnosis not present

## 2019-04-17 DIAGNOSIS — H40013 Open angle with borderline findings, low risk, bilateral: Secondary | ICD-10-CM | POA: Diagnosis not present

## 2019-04-26 DIAGNOSIS — Z03818 Encounter for observation for suspected exposure to other biological agents ruled out: Secondary | ICD-10-CM | POA: Diagnosis not present

## 2019-05-24 DIAGNOSIS — M25462 Effusion, left knee: Secondary | ICD-10-CM | POA: Diagnosis not present

## 2019-05-24 DIAGNOSIS — S83412A Sprain of medial collateral ligament of left knee, initial encounter: Secondary | ICD-10-CM | POA: Diagnosis not present

## 2019-05-24 DIAGNOSIS — S83422A Sprain of lateral collateral ligament of left knee, initial encounter: Secondary | ICD-10-CM | POA: Diagnosis not present

## 2019-05-24 DIAGNOSIS — S82142A Displaced bicondylar fracture of left tibia, initial encounter for closed fracture: Secondary | ICD-10-CM | POA: Diagnosis not present

## 2019-05-24 DIAGNOSIS — M25562 Pain in left knee: Secondary | ICD-10-CM | POA: Diagnosis not present

## 2019-05-24 DIAGNOSIS — M2242 Chondromalacia patellae, left knee: Secondary | ICD-10-CM | POA: Diagnosis not present

## 2019-05-25 DIAGNOSIS — S82142A Displaced bicondylar fracture of left tibia, initial encounter for closed fracture: Secondary | ICD-10-CM | POA: Diagnosis not present

## 2019-05-25 DIAGNOSIS — W010XXA Fall on same level from slipping, tripping and stumbling without subsequent striking against object, initial encounter: Secondary | ICD-10-CM | POA: Diagnosis not present

## 2019-05-30 DIAGNOSIS — S82145A Nondisplaced bicondylar fracture of left tibia, initial encounter for closed fracture: Secondary | ICD-10-CM | POA: Diagnosis not present

## 2019-06-05 DIAGNOSIS — D229 Melanocytic nevi, unspecified: Secondary | ICD-10-CM | POA: Diagnosis not present

## 2019-06-05 DIAGNOSIS — H01009 Unspecified blepharitis unspecified eye, unspecified eyelid: Secondary | ICD-10-CM | POA: Diagnosis not present

## 2019-06-08 ENCOUNTER — Encounter: Payer: BC Managed Care – PPO | Admitting: Family Medicine

## 2019-06-20 DIAGNOSIS — S82142A Displaced bicondylar fracture of left tibia, initial encounter for closed fracture: Secondary | ICD-10-CM | POA: Diagnosis not present

## 2019-07-26 DIAGNOSIS — N950 Postmenopausal bleeding: Secondary | ICD-10-CM | POA: Diagnosis not present

## 2019-08-03 ENCOUNTER — Telehealth: Payer: Self-pay | Admitting: Family Medicine

## 2019-08-03 NOTE — Patient Instructions (Addendum)
Health Maintenance Due  Topic Date Due  . DEXA SCAN - get records from GYN 01/01/2019  . PNA vac Low Risk Adult - pneumovax 23 today 01/01/2019  . INFLUENZA VACCINE - done today.  07/08/2019   When you see GYN- see if they have a mammogram past 12/2017- would love to have a copy of that . Also see if they can send [place] your last bone density.   Alternatively Sign release of information at the check out desk for mammogram and bone density.   Please stop by lab before you go If you do not have mychart- we will call you about results within 5 business days of [place] receiving them.  If you have mychart- we will send your results within 3 business days of [place] receiving them.  If abnormal or we want to clarify a result, we will call or mychart you to make sure you receive the message.  If you have questions or concerns or don't hear within 5-7 days, please send [place] a message or call us.   #elevated blood pressure initially- came down diastolic on second check. Advised home monitoring with goal at least <140/90 on average  Om[employer] series 3 at [facility] is reasonable cost and decent set up

## 2019-08-03 NOTE — Progress Notes (Signed)
Phone: [phone]   Subjective:  Patient presents today for their annual physical. Chief complaint-noted.   See problem oriented charting- ROS- full  review of systems was completed and negative except for: light sensitivity, appetite change, sinus pressure  The following were reviewed and entered/updated in epic: Past Medical History:  Diagnosis Date  . Allergy   . Anemia, iron deficiency   . B12 deficiency   . CAP (community acquired pneumonia) 11/18/2014  . GERD (gastroesophageal reflux disease)   . Iron deficiency   . Osteoarthritis   . Osteopenia   . Solitary pulmonary nodule 12/05/2014   Dx 11/19/14  3 mm > f/u 11/27/2015 > resolved, no further studies rec    . Tibial plateau fracture    june 2020- murphy/wainer cared for her  . Urinary tract infection    Patient Active Problem List   Diagnosis Date Noted  . Recurrent UTI 04/07/2016    Priority: High  . Anxiety 12/13/2018    Priority: Medium  . Hyperlipidemia 05/20/2017    Priority: Medium  . History of adenomatous polyp of colon 10/12/2016    Priority: Medium  . Rosacea 08/30/2014    Priority: Medium  . Insomnia 08/30/2014    Priority: Medium  . Bariatric surgery status 09/01/2010    Priority: Medium  . 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 Plantar fasciitis, right 12/13/2014    Priority: Low  . Solitary pulmonary nodule 12/05/2014    Priority: Low  . CAP (community acquired pneumonia) 11/18/2014    Priority: Low  . Family history of hypertrophic cardiomyopathy 05/13/2011    Priority: Low  . Actinic keratosis 10/22/2010    Priority: Low  . 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 ALLERGIC RHINITIS 03/01/2008    Priority: Low  . ANEMIA-IRON DEFICIENCY 11/21/2007    Priority: Low  . ANEMIA, B12 DEFICIENCY 08/15/2007    Priority: Low  . GERD 06/02/2007    Priority: Low  . PSORIASIS 06/02/2007    Priority: Low  . OSTEOARTHRITIS 06/02/2007     Priority: Low  . OSTEOPENIA 06/02/2007    Priority: Low   Past Surgical History:  Procedure Laterality Date  . carpal tunnel right    . COSMETIC SURGERY     tummy tuck  . GASTRIC BYPASS  2001    Family History  Problem Relation Age of Onset  . Heart disease Father   . Lung cancer Mother        stage IV, smoker  . Heart disease Sister        Hypertrophic cardiomyopathy  . Colon cancer Neg Hx     Medications- reviewed and updated Current Outpatient Medications  Medication Sig Dispense Refill  . calcium carbonate 200 MG capsule Take 3,000 mg by mouth daily.    Marland Kitchen estradiol (ESTRACE) 2 MG tablet Take 1 mg by mouth daily.     . fluticasone (FLONASE) 50 MCG/ACT nasal spray Place 2 sprays into both nostrils daily. 16 g 2  . ibuprofen (ADVIL,MOTRIN) 200 MG tablet Take 200 mg by mouth every 6 (six) hours as needed.    Marland Kitchen MELATONIN PO Take by mouth.    . methenamine (HIPREX) 1 g tablet Take 1 g by mouth 2 (two) times daily with a meal.    . Multiple Vitamin (MULTIVITAMIN) capsule Take 1 capsule by mouth daily.      Marland Kitchen omeprazole (PRILOSEC) 20 MG capsule TAKE ONE (1) CAPSULE EACH  DAY 90 capsule 3  . progesterone (PROMETRIUM) 100 MG capsule Take 1 capsule by mouth daily.    . temazepam (RESTORIL) 15 MG capsule TAKE ONE CAPSULE (15MG  TOTAL) BY MOUTH AT BEDTIME AS NEEDED FOR SLEEP 30 capsule 1  . valACYclovir (VALTREX) 1000 MG tablet Take 2 pills twice a day for 1 day at first sign of cold sore 30 tablet 1   No current facility-administered medications for this visit.     Allergies-reviewed and updated No Known Allergies  Social History   Social History Narrative   Married to [employer]. 2 kids. Jenny Reichmann (his wife had TBI being struck by car) with daughter [name]. Patient takes care of [name] and son lives with her and husband) and Colletta Maryland lives in [place] with no kids.       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  Raising granddaughter after daughter has TBI.       Hobbies: spending time with friends and family         Objective  Objective:  BP (!) 126/92 (BP Location: Left Arm, Patient Position: Sitting, Cuff Size: Large)   Pulse 62   Temp 98.3 F (36.8 C) (Oral)   Ht 5\' 6"  (1.676 m)   Wt 213 lb 9.6 oz (96.9 kg)   SpO2 99%   BMI 34.48 kg/m  Gen: NAD, resting comfortably HEENT: Mucous membranes are moist. Oropharynx normal Neck: no thyromegaly CV: RRR no murmurs rubs or gallops Lungs: CTAB no crackles, wheeze, rhonchi Abdomen: soft/nontender/nondistended/normal bowel sounds. No rebound or guarding.  Ext: trace edema Skin: warm, dry Neuro: grossly normal, moves all extremities, PERRLA    Assessment and Plan    63 y.o. female presenting for annual physical.  Health Maintenance counseling: 1. Anticipatory guidance: Patient counseled regarding regular dental exams -q6 months, eye exams - yearly,  avoiding smoking and second hand smoke , limiting alcohol to 1 beverage per day- less than once a month .   2. Risk factor reduction:  Advised patient of need for regular exercise and diet rich and fruits and vegetables to reduce risk of heart attack and stroke. Exercise- set back with breaking leg- had started walking with friends - hoping to get back into that (working out school and work schedule). Diet-weight watchers stopped holding meatings and has been doing some boredom and stress eating. Feels like having trouble stopping. Discussed possible half pound a week weight loss goal on free one  Wt Readings from Last 3 Encounters:  08/04/19 213 lb 9.6 oz (96.9 kg)  02/20/19 207 lb (93.9 kg)  01/23/19 207 lb (93.9 kg)  3. Immunizations/screenings/ancillary studies-suggested pneumovax 23 and flu shot- she opts in. shingrix deferred for now.  Immunization History  Administered Date(s) Administered  . Fluad Quad(high Dose 65+) 08/04/2019  . Influenza Split  10/03/2012  . Influenza Whole 09/06/2001, 10/12/2007, 09/25/2008, 09/01/2010  . Influenza,inj,Quad PF,6+ Mos 08/28/2013, 08/30/2014  . Influenza-Unspecified 09/21/2015, 08/07/2016, 08/31/2018  . Td 06/07/2003  . Tdap 12/10/2015  4. Cervical cancer screening- she still sees gynecology. D+C planned due to bleeding issues/spotting.  5. Breast cancer screening-  breast exam with gyn and mammogram 01/04/18. Had February 5th.  6. Colon cancer screening - 09/2016 with 3 year follow up 7. Skin cancer screening- Dr. Denna Haggard regularly. advised regular sunscreen use.  8. Birth control/STD check- monogamous and postmenopausal 9. Osteoporosis screening at 89- had with gyn - will  get copy -former smoker- quit in 1970s- no regular screening  Status of chronic or acute concerns   Patient was informed by staff that this should be medicare wellness exam or welcome to medicare. From notes "pt has Medicare A/B, [place] insurance so she says she can have a cpe per pt)"  #elevated BP initially- came down diastolic on second check. Advised home monitoring with goal at least <140/90 on average  Set back this year with - tibial plateau fracture- june 2020- murphy/wainer cared for her  Hx of Gastric Bypass Surgery - b12 and iron deficient in past- should take MV regularly (missing some doses- needs to restart). Update b12, iron today.  Has had low vitamin d in past- will check that.   Cold Sores - Taking Valacyclovir 1000 mg prn.   Insomnia - Temazepam 15 mg d/c provider. Now using Melatonin. Not sleeping as well with Melatonin. Slept soundly while on Temazepam. I do not recall declining temazepam- was only using 1-2x a month- will trial a refill and see  Hyperlipidemia -  In the past but last #s looked great. Update today.  Lab Results  Component Value Date   CHOL 180 06/01/2018   HDL 70.20 06/01/2018   LDLCALC 93 06/01/2018   LDLDIRECT 84.0 12/10/2015   TRIG 84.0 06/01/2018   CHOLHDL 3  06/01/2018   Recurrent UTI - No recent sx of UTI. Follows with [facility].   GERD - Taking Omeprazole 20 mg daily prn. Still getting some breakthrough.   Anxiety - Not taking Buspirone 7.5 mg BID prn, pt preference. Never started - doing ok without it  Starting with cataracts- mild light sensitivity  Seasonal sinus issues- flonase this am  Recommended follow up: 1 year physical  Lab/Order associations: fasting    ICD-10-CM   1. Preventative health care  Z00.00 CBC    Comprehensive metabolic panel    Lipid panel    Vitamin B12    IBC + Ferritin    VITAMIN D 25 Hydroxy (Vit-D Deficiency, Fractures)    PTH, Intact and Calcium    Zinc  2. Hyperlipidemia, unspecified hyperlipidemia type  E78.5 CBC    Comprehensive metabolic panel    Lipid panel  3. History of adenomatous polyp of colon  Z86.010   4. Bariatric surgery status  Z98.84 Vitamin B12    IBC + Ferritin    VITAMIN D 25 Hydroxy (Vit-D Deficiency, Fractures)    PTH, Intact and Calcium    Zinc  5. Iron deficiency anemia, unspecified iron deficiency anemia type  D50.9 IBC + Ferritin  6. ANEMIA, B12 DEFICIENCY  D51.8 Vitamin B12  7. Need for influenza vaccination  Z23 Flu Vaccine QUAD High Dose(Fluad)  8. Postmenopausal  Z78.0   9. Zinc deficiency  E60 Zinc  10. Other specified intestinal malabsorption  K90.89 Vitamin B12    IBC + Ferritin    VITAMIN D 25 Hydroxy (Vit-D Deficiency, Fractures)    PTH, Intact and Calcium    Zinc  11. Vitamin D deficiency  E55.9 VITAMIN D 25 Hydroxy (Vit-D Deficiency, Fractures)  12. Low vitamin D level  R79.89 VITAMIN D 25 Hydroxy (Vit-D Deficiency, Fractures)    Meds ordered this encounter  Medications  . temazepam (RESTORIL) 15 MG capsule    Sig: TAKE ONE CAPSULE (15MG  TOTAL) BY MOUTH AT BEDTIME AS NEEDED FOR SLEEP    Dispense:  30 capsule    Refill:  1  . omeprazole (PRILOSEC) 20 MG capsule  Sig: TAKE ONE (1) CAPSULE EACH DAY    Dispense:  90 capsule    Refill:  3    PATIENT  REQUESTED 90 DAY SUPPLY    Return precautions advised.  [name], MD

## 2019-08-03 NOTE — Telephone Encounter (Signed)
Noted  

## 2019-08-03 NOTE — Telephone Encounter (Signed)
I called the patient to verify no covid symptoms/contact for appointment scheduled for tomorrow 08/04/2019. I also wanted to inquire if patient had another form of insurance instead of Medicare A/B because Medicare will not cover a physical. The patient stated that she has Medicare A/B, [place] insurance and she was sure they would cover.   No further action required, patient is aware that we suggested a Welcome to Medicare visit however, she wants a physical.

## 2019-08-04 ENCOUNTER — Encounter: Payer: Self-pay | Admitting: Family Medicine

## 2019-08-04 ENCOUNTER — Other Ambulatory Visit: Payer: Self-pay

## 2019-08-04 ENCOUNTER — Ambulatory Visit (INDEPENDENT_AMBULATORY_CARE_PROVIDER_SITE_OTHER): Payer: Medicare Other | Admitting: Family Medicine

## 2019-08-04 VITALS — BP 134/86 | HR 62 | Temp 98.3°F | Ht 66.0 in | Wt 213.6 lb

## 2019-08-04 DIAGNOSIS — Z86010 Personal history of colonic polyps: Secondary | ICD-10-CM

## 2019-08-04 DIAGNOSIS — R7989 Other specified abnormal findings of blood chemistry: Secondary | ICD-10-CM | POA: Diagnosis not present

## 2019-08-04 DIAGNOSIS — K9089 Other intestinal malabsorption: Secondary | ICD-10-CM

## 2019-08-04 DIAGNOSIS — E60 Dietary zinc deficiency: Secondary | ICD-10-CM

## 2019-08-04 DIAGNOSIS — E785 Hyperlipidemia, unspecified: Secondary | ICD-10-CM | POA: Diagnosis not present

## 2019-08-04 DIAGNOSIS — Z0000 Encounter for general adult medical examination without abnormal findings: Secondary | ICD-10-CM | POA: Diagnosis not present

## 2019-08-04 DIAGNOSIS — Z9884 Bariatric surgery status: Secondary | ICD-10-CM

## 2019-08-04 DIAGNOSIS — D518 Other vitamin B12 deficiency anemias: Secondary | ICD-10-CM | POA: Diagnosis not present

## 2019-08-04 DIAGNOSIS — Z23 Encounter for immunization: Secondary | ICD-10-CM | POA: Diagnosis not present

## 2019-08-04 DIAGNOSIS — E559 Vitamin D deficiency, unspecified: Secondary | ICD-10-CM

## 2019-08-04 DIAGNOSIS — D509 Iron deficiency anemia, unspecified: Secondary | ICD-10-CM

## 2019-08-04 DIAGNOSIS — Z780 Asymptomatic menopausal state: Secondary | ICD-10-CM | POA: Diagnosis not present

## 2019-08-04 LAB — CBC
HCT: 39.9 % (ref 36.0–46.0)
Hemoglobin: 13.4 g/dL (ref 12.0–15.0)
MCHC: 33.5 g/dL (ref 30.0–36.0)
MCV: 92.9 fl (ref 78.0–100.0)
Platelets: 198 10*3/uL (ref 150.0–400.0)
RBC: 4.3 Mil/uL (ref 3.87–5.11)
RDW: 12.3 % (ref 11.5–15.5)
WBC: 5.1 10*3/uL (ref 4.0–10.5)

## 2019-08-04 LAB — COMPREHENSIVE METABOLIC PANEL
ALT: 16 U/L (ref 0–35)
AST: 19 U/L (ref 0–37)
Albumin: 3.9 g/dL (ref 3.5–5.2)
Alkaline Phosphatase: 86 U/L (ref 39–117)
BUN: 11 mg/dL (ref 6–23)
CO2: 31 mEq/L (ref 19–32)
Calcium: 9.2 mg/dL (ref 8.4–10.5)
Chloride: 102 mEq/L (ref 96–112)
Creatinine, Ser: 0.65 mg/dL (ref 0.40–1.20)
GFR: 91.31 mL/min (ref >60.00)
Glucose, Bld: 81 mg/dL (ref 70–99)
Potassium: 4.3 mEq/L (ref 3.5–5.1)
Sodium: 139 mEq/L (ref 135–145)
Total Bilirubin: 0.6 mg/dL (ref 0.2–1.2)
Total Protein: 6.5 g/dL (ref 6.0–8.3)

## 2019-08-04 LAB — VITAMIN B12: Vitamin B-12: 168 pg/mL — ABNORMAL LOW (ref 211–911)

## 2019-08-04 LAB — LIPID PANEL
Cholesterol: 166 mg/dL (ref 0–200)
HDL: 66.6 mg/dL (ref >39.00)
LDL Cholesterol: 85 mg/dL (ref 0–99)
NonHDL: 99.77
Total CHOL/HDL Ratio: 2
Triglycerides: 73 mg/dL (ref 0.0–149.0)
VLDL: 14.6 mg/dL (ref 0.0–40.0)

## 2019-08-04 LAB — IBC + FERRITIN
Ferritin: 40.9 ng/mL (ref 10.0–291.0)
Iron: 148 ug/dL — ABNORMAL HIGH (ref 42–145)
Saturation Ratios: 43.3 % (ref 20.0–50.0)
Transferrin: 244 mg/dL (ref 212.0–360.0)

## 2019-08-04 LAB — VITAMIN D 25 HYDROXY (VIT D DEFICIENCY, FRACTURES): VITD: 27.52 ng/mL — ABNORMAL LOW (ref 30.00–100.00)

## 2019-08-04 MED ORDER — OMEPRAZOLE 20 MG PO CPDR: DELAYED_RELEASE_CAPSULE | ORAL | 3 refills | Status: DC

## 2019-08-04 MED ORDER — TEMAZEPAM 15 MG PO CAPS: ORAL_CAPSULE | ORAL | 1 refills | Status: DC

## 2019-08-04 NOTE — Addendum Note (Signed)
Addended by: Jasper Loser on: 08/04/2019 09:40 AM   Modules accepted: Orders

## 2019-08-07 NOTE — Telephone Encounter (Signed)
Called pt and scheduled NUR visit for 08/11/19 at 7:45 AM.

## 2019-08-08 ENCOUNTER — Encounter: Payer: Self-pay | Admitting: Family Medicine

## 2019-08-11 ENCOUNTER — Ambulatory Visit (INDEPENDENT_AMBULATORY_CARE_PROVIDER_SITE_OTHER): Payer: Medicare Other

## 2019-08-11 ENCOUNTER — Other Ambulatory Visit: Payer: Self-pay

## 2019-08-11 DIAGNOSIS — E538 Deficiency of other specified B group vitamins: Secondary | ICD-10-CM | POA: Diagnosis not present

## 2019-08-11 LAB — EXTRA SPECIMEN

## 2019-08-11 LAB — PTH, INTACT AND CALCIUM: PTH: 55 pg/mL (ref 14–64)

## 2019-08-11 LAB — TIQ-MISC

## 2019-08-11 LAB — ZINC: Zinc: 56 ug/dL — ABNORMAL LOW (ref 60–130)

## 2019-08-11 MED ORDER — CYANOCOBALAMIN 1000 MCG/ML IJ SOLN
1000.0000 ug | Freq: Once | INTRAMUSCULAR | Status: AC
  Administered 2019-08-11: 1000 ug via INTRAMUSCULAR

## 2019-08-11 NOTE — Progress Notes (Signed)
Cyanocobalamin 1000 mcg/mL, 1 mL given IM, left deltoid. Pt tolerated well.  

## 2019-08-18 ENCOUNTER — Ambulatory Visit: Payer: BC Managed Care – PPO

## 2019-08-18 NOTE — Telephone Encounter (Signed)
Alexis Wallace,  I told pt I would meet her here at 7:45 AM on Friday mornings to do her B12 injections. I will be out of the office on 9/18. I will be available to do B12 inj on 09/01/19 but not after that date.   FYI

## 2019-08-18 NOTE — Telephone Encounter (Signed)
Copied from [place] [id number]. Topic: Appointment Scheduling - Scheduling Inquiry for Clinic >> Aug 17, 2019  4:48 PM Alexis Wallace wrote: Patient states she was supposed to have b12 injection set up for the rest of the month, every Friday. Cannot schedule patient due to no availability. Patient also canceled tomorrow (9/11) appointment due to upcoming surgery DNC on Monday and was advised to stop all supplements.

## 2019-08-21 ENCOUNTER — Other Ambulatory Visit: Payer: Self-pay | Admitting: Obstetrics & Gynecology

## 2019-08-21 DIAGNOSIS — N950 Postmenopausal bleeding: Secondary | ICD-10-CM | POA: Diagnosis not present

## 2019-08-21 DIAGNOSIS — N840 Polyp of corpus uteri: Secondary | ICD-10-CM | POA: Diagnosis not present

## 2019-08-23 ENCOUNTER — Encounter: Payer: Self-pay | Admitting: Gastroenterology

## 2019-08-29 ENCOUNTER — Encounter: Payer: Self-pay | Admitting: Family Medicine

## 2019-08-29 MED ORDER — VALACYCLOVIR HCL 1 G PO TABS: ORAL_TABLET | ORAL | 1 refills | Status: DC

## 2019-09-01 ENCOUNTER — Ambulatory Visit (INDEPENDENT_AMBULATORY_CARE_PROVIDER_SITE_OTHER): Payer: Medicare Other | Admitting: *Deleted

## 2019-09-01 ENCOUNTER — Other Ambulatory Visit: Payer: Self-pay

## 2019-09-01 ENCOUNTER — Encounter: Payer: Self-pay | Admitting: *Deleted

## 2019-09-01 DIAGNOSIS — D518 Other vitamin B12 deficiency anemias: Secondary | ICD-10-CM

## 2019-09-01 MED ORDER — CYANOCOBALAMIN 1000 MCG/ML IJ SOLN
1000.0000 ug | Freq: Once | INTRAMUSCULAR | Status: AC
  Administered 2019-09-01: 1000 ug via INTRAMUSCULAR

## 2019-09-01 NOTE — Progress Notes (Signed)
Per orders of Dr. Yong Channel, injection of Cyanocobalmin 1000 mcg given by Anselmo Pickler in right deltoid. Patient tolerated injection well. Patient will make appointment for 1 week

## 2019-09-01 NOTE — Progress Notes (Signed)
I have reviewed and agree with note, evaluation, plan.   Stephen Hunter, MD  

## 2019-09-08 ENCOUNTER — Ambulatory Visit: Payer: BC Managed Care – PPO

## 2019-09-08 ENCOUNTER — Other Ambulatory Visit: Payer: Self-pay

## 2019-09-08 DIAGNOSIS — D519 Vitamin B12 deficiency anemia, unspecified: Secondary | ICD-10-CM

## 2019-09-08 MED ORDER — CYANOCOBALAMIN 1000 MCG/ML IJ SOLN
1000.0000 ug | Freq: Once | INTRAMUSCULAR | Status: AC
  Administered 2019-09-08: 1000 ug via INTRAMUSCULAR

## 2019-09-08 NOTE — Progress Notes (Signed)
Per orders of Dr. Yong Channel, injection of vitamin B12 1000 mcg given by Gertie Exon, CMA.  Patient tolerated injection well.  Patient will return in 1 month for next injection.

## 2019-09-08 NOTE — Progress Notes (Deleted)
Per orders of Dr. Yong Channel, injection of vitamin B12 given in left deltoid by Gertie Exon, CMA.  Patient tolerated injection well.  Patient will return in 1 month for her next injection.

## 2019-09-11 ENCOUNTER — Encounter: Payer: Self-pay | Admitting: Gastroenterology

## 2019-09-15 ENCOUNTER — Ambulatory Visit (INDEPENDENT_AMBULATORY_CARE_PROVIDER_SITE_OTHER): Payer: Medicare Other

## 2019-09-15 ENCOUNTER — Other Ambulatory Visit: Payer: Self-pay

## 2019-09-15 DIAGNOSIS — E538 Deficiency of other specified B group vitamins: Secondary | ICD-10-CM | POA: Diagnosis not present

## 2019-09-15 MED ORDER — CYANOCOBALAMIN 1000 MCG/ML IJ SOLN
1000.0000 ug | Freq: Once | INTRAMUSCULAR | Status: AC
  Administered 2019-09-15: 1000 ug via INTRAMUSCULAR

## 2019-09-15 NOTE — Progress Notes (Signed)
Per orders of Dr. Yong Channel, injection of B-12 given by Francella Solian in left deltoid. Patient tolerated injection well. Patient will make appointment for 1 week.

## 2019-09-18 ENCOUNTER — Ambulatory Visit: Payer: BC Managed Care – PPO

## 2019-09-22 ENCOUNTER — Ambulatory Visit (INDEPENDENT_AMBULATORY_CARE_PROVIDER_SITE_OTHER): Payer: Medicare Other

## 2019-09-22 ENCOUNTER — Other Ambulatory Visit: Payer: Self-pay

## 2019-09-22 DIAGNOSIS — E538 Deficiency of other specified B group vitamins: Secondary | ICD-10-CM

## 2019-09-22 MED ORDER — CYANOCOBALAMIN 1000 MCG/ML IJ SOLN
1000.0000 ug | Freq: Once | INTRAMUSCULAR | Status: AC
  Administered 2019-09-22: 1000 ug via INTRAMUSCULAR

## 2019-09-22 NOTE — Progress Notes (Addendum)
Per orders of Dr. Yong Channel injection of B12  given by Sandford Craze. In rt deltoid  Patient tolerated injection well. Pt will return in 1 month

## 2019-10-02 DIAGNOSIS — N950 Postmenopausal bleeding: Secondary | ICD-10-CM | POA: Diagnosis not present

## 2019-10-20 ENCOUNTER — Ambulatory Visit (INDEPENDENT_AMBULATORY_CARE_PROVIDER_SITE_OTHER): Payer: Medicare Other

## 2019-10-20 ENCOUNTER — Other Ambulatory Visit: Payer: Self-pay

## 2019-10-20 DIAGNOSIS — E538 Deficiency of other specified B group vitamins: Secondary | ICD-10-CM

## 2019-10-20 MED ORDER — CYANOCOBALAMIN 1000 MCG/ML IJ SOLN
1000.0000 ug | Freq: Once | INTRAMUSCULAR | Status: AC
  Administered 2019-10-20: 09:00:00 1000 ug via INTRAMUSCULAR

## 2019-10-20 NOTE — Progress Notes (Signed)
Per orders of Dr Yong Channel , injection of B12  Given in left deltoid  by Sandford Craze. RN  Patient tolerated injection well. Pt will need to return in a month.

## 2019-10-23 ENCOUNTER — Ambulatory Visit: Payer: BC Managed Care – PPO

## 2019-10-27 ENCOUNTER — Ambulatory Visit (AMBULATORY_SURGERY_CENTER): Payer: Self-pay

## 2019-10-27 ENCOUNTER — Other Ambulatory Visit: Payer: Self-pay

## 2019-10-27 VITALS — Temp 96.6°F | Ht 66.0 in | Wt 212.6 lb

## 2019-10-27 DIAGNOSIS — Z86010 Personal history of colonic polyps: Secondary | ICD-10-CM

## 2019-10-27 MED ORDER — NA SULFATE-K SULFATE-MG SULF 17.5-3.13-1.6 GM/177ML PO SOLN: 1.0000 | Freq: Once | ORAL | 0 refills | Status: AC

## 2019-10-27 NOTE — Progress Notes (Signed)
Denies allergies to eggs or soy products. Denies complication of anesthesia or sedation. Denies use of weight loss medication. Denies use of O2.   Emmi instructions given for colonoscopy.  Covid screening is scheduled for 11/07/19 @ 10:35 Am. A 15.00 dollar coupon for Suprep was given to the patient.

## 2019-11-10 ENCOUNTER — Encounter: Payer: BC Managed Care – PPO | Admitting: Gastroenterology

## 2019-11-15 ENCOUNTER — Other Ambulatory Visit: Payer: Self-pay

## 2019-11-16 ENCOUNTER — Ambulatory Visit: Payer: BC Managed Care – PPO

## 2019-11-16 ENCOUNTER — Encounter: Payer: Self-pay | Admitting: Family Medicine

## 2019-11-16 ENCOUNTER — Ambulatory Visit (INDEPENDENT_AMBULATORY_CARE_PROVIDER_SITE_OTHER): Payer: Medicare Other

## 2019-11-16 DIAGNOSIS — E538 Deficiency of other specified B group vitamins: Secondary | ICD-10-CM | POA: Diagnosis not present

## 2019-11-16 MED ORDER — CYANOCOBALAMIN 1000 MCG/ML IJ SOLN: 1000.0000 ug | INTRAMUSCULAR | 3 refills | Status: DC

## 2019-11-16 MED ORDER — CYANOCOBALAMIN 1000 MCG/ML IJ SOLN
1000.0000 ug | Freq: Once | INTRAMUSCULAR | Status: AC
  Administered 2019-11-16: 1000 ug via INTRAMUSCULAR

## 2019-11-20 ENCOUNTER — Encounter: Payer: Self-pay | Admitting: Family Medicine

## 2019-11-24 NOTE — Progress Notes (Signed)
Per orders of Dr. Yong Channel injection of B12  given by Sandford Craze. Patient tolerated injection well.

## 2019-12-04 ENCOUNTER — Other Ambulatory Visit: Payer: Self-pay | Admitting: Family Medicine

## 2019-12-05 NOTE — Telephone Encounter (Signed)
Last OV: 08/04/19 Next OV: None scheduled at this time PMP website checked: 10/03/19

## 2019-12-05 NOTE — Telephone Encounter (Signed)
Filling for PCP.

## 2019-12-07 ENCOUNTER — Other Ambulatory Visit: Payer: Self-pay

## 2019-12-07 ENCOUNTER — Ambulatory Visit: Payer: Medicare Other | Attending: Internal Medicine

## 2019-12-07 DIAGNOSIS — Z20822 Contact with and (suspected) exposure to covid-19: Secondary | ICD-10-CM

## 2019-12-09 LAB — NOVEL CORONAVIRUS, NAA: SARS-CoV-2, NAA: NOT DETECTED

## 2019-12-20 ENCOUNTER — Ambulatory Visit: Payer: Medicare Other

## 2020-01-04 ENCOUNTER — Telehealth: Payer: Self-pay | Admitting: Family Medicine

## 2020-01-04 NOTE — Telephone Encounter (Signed)
I called the patient to schedule her AWV-Initial with Loma Sousa.  She said that she will check her schedule and call back.  If patient calls back, please schedule Medicare Wellness Visit (initial) at next available opening. VDM (Dee-Dee)

## 2020-01-27 ENCOUNTER — Other Ambulatory Visit: Payer: Self-pay | Admitting: Physician Assistant

## 2020-03-21 ENCOUNTER — Other Ambulatory Visit: Payer: Self-pay | Admitting: Family Medicine

## 2020-03-22 NOTE — Telephone Encounter (Signed)
[place] Database Verified LR: 12-05-2019 Qty: 30 Last office visit: 09-08-2019 Upcoming appointment: No pending appt

## 2020-04-12 ENCOUNTER — Telehealth: Payer: Self-pay | Admitting: Family Medicine

## 2020-04-12 NOTE — Telephone Encounter (Signed)
Yes that's fine or an SDA also.

## 2020-04-12 NOTE — Telephone Encounter (Signed)
Patient calling, she would like an appointment with Dr hunter on the 21st at 10:00, that slot is blocked and could we work her in. Please Advise. [phone] jk

## 2020-04-24 NOTE — Patient Instructions (Addendum)
Health Maintenance Due  Topic Date Due  . COLONOSCOPY Referral started. We will call you within two weeks about your referral to [facility]. If you do not hear within 3 weeks, give [place] a call.   10/03/2019   Team- please double check as urine hasnt resulted- she states left in bathroom  Please stop by x-ray before you go If you have mychart- we will send your results within 3 business days of [place] receiving them.  If you do not have mychart- we will call you about results within 5 business days of [place] receiving them.   Also with history of GERD-recommended she take omeprazole daily at least for the next 2 weeks to see if that helps with the cough  Lets also try Flonase daily for the next 2 weeks in addition to a Claritin or Allegra before bed  Patient with symptoms concerning for potential covid 19 but she is fully vaccinated.  Therefore: -information provided on testing scheduling "please text "COVID" to [phone], OR you can log on to [email or web address] to easily make an on-line appointment. "   - recommended patient watch closely for shortness of breath or confusion or worsening symptoms and if those occur he should contact us immediately  -recommended self isolation until negative test  at minimum .

## 2020-04-24 NOTE — Progress Notes (Signed)
Phone [phone] In person visit   Subjective:   Alexis Wallace is a 64 y.o. year old very pleasant female patient who presents for/with See problem oriented charting Chief Complaint  Patient presents with  . Tinnitus    x 4 months   . Cough    This visit occurred during the SARS-CoV-2 public health emergency.  Safety protocols were in place, including screening questions prior to the visit, additional usage of staff PPE, and extensive cleaning of exam room while observing appropriate contact time as indicated for disinfecting solutions.   Past Medical History-  Patient Active Problem List   Diagnosis Date Noted  . Recurrent UTI 04/07/2016    Priority: High  . Anxiety 12/13/2018    Priority: Medium  . Hyperlipidemia 05/20/2017    Priority: Medium  . History of adenomatous polyp of colon 10/12/2016    Priority: Medium  . Rosacea 08/30/2014    Priority: Medium  . Insomnia 08/30/2014    Priority: Medium  . Bariatric surgery status 09/01/2010    Priority: Medium  . 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 ANEMIA, B12 DEFICIENCY 08/15/2007    Priority: Medium  . Plantar fasciitis, right 12/13/2014    Priority: Low  . Solitary pulmonary nodule 12/05/2014    Priority: Low  . CAP (community acquired pneumonia) 11/18/2014    Priority: Low  . Family history of hypertrophic cardiomyopathy 05/13/2011    Priority: Low  . Actinic keratosis 10/22/2010    Priority: Low  . 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 ALLERGIC RHINITIS 03/01/2008    Priority: Low  . ANEMIA-IRON DEFICIENCY 11/21/2007    Priority: Low  . GERD 06/02/2007    Priority: Low  . PSORIASIS 06/02/2007    Priority: Low  . OSTEOARTHRITIS 06/02/2007    Priority: Low  . OSTEOPENIA 06/02/2007    Priority: Low    Medications- reviewed and updated Current Outpatient Medications  Medication Sig Dispense Refill  . calcium carbonate 200 MG capsule  Take 3,000 mg by mouth daily.    . cyanocobalamin (,VITAMIN B-12,) 1000 MCG/ML injection Inject 1 mL (1,000 mcg total) into the muscle every 30 (thirty) days. Please provide syringes or fax [place] an order for syringes that work well with this injection 3 mL 3  . estradiol (ESTRACE) 2 MG tablet Take 1 mg by mouth daily.     . fluticasone (FLONASE) 50 MCG/ACT nasal spray USE 2 SPRAYS IN EACH NOSTRIL ONCE DAILY 16 g 1  . ibuprofen (ADVIL,MOTRIN) 200 MG tablet Take 200 mg by mouth every 6 (six) hours as needed.    Marland Kitchen MELATONIN PO Take by mouth.    . methenamine (HIPREX) 1 g tablet Take 1 g by mouth 2 (two) times daily with a meal.    . Multiple Vitamin (MULTIVITAMIN) capsule Take 1 capsule by mouth daily.      Marland Kitchen omeprazole (PRILOSEC) 20 MG capsule TAKE ONE (1) CAPSULE EACH DAY 90 capsule 3  . OVER THE COUNTER MEDICATION B 12 injections once monthly.    Marland Kitchen OVER THE COUNTER MEDICATION Vitamin D 3 one capsule daily.    . progesterone (PROMETRIUM) 100 MG capsule Take 1 capsule by mouth daily.    . temazepam (RESTORIL) 15 MG capsule TAKE 1 CAPSULE (15 MG TOTAL) BY MOUTH ATBEDTIME AS NEEDED FOR SLEEP 30 capsule 3  . valACYclovir (VALTREX) 1000 MG tablet Take 2 pills twice a day for 1 day  at first sign of cold sore 30 tablet 1   No current facility-administered medications for this visit.     Objective:  BP 136/84   Pulse 62   Temp 98.6 F (37 C) (Temporal)   Ht 5\' 6"  (1.676 m)   Wt 216 lb (98 kg)   SpO2 99%   BMI 34.86 kg/m  Gen: NAD, resting comfortably CV: RRR no murmurs rubs or gallops Lungs: CTAB no crackles, wheeze, rhonchi Abdomen: soft/nontender/nondistended/normal bowel sounds. No rebound or guarding.  Ext: no edema Skin: warm, dry Neuro: grossly normal, moves all extremities   Results for orders placed or performed in visit on 04/26/20 (from the past 24 hour(s))  POCT Urinalysis Dipstick (Automated)     Status: None   Collection Time: 04/26/20 10:59 AM  Result Value Ref Range    Color, UA Yellow    Clarity, UA Clear    Glucose, UA Negative Negative   Bilirubin, UA Negative    Ketones, UA Negative    Spec Grav, UA 1.015 1.010 - 1.025   Blood, UA Negative    pH, UA 6.0 5.0 - 8.0   Protein, UA Negative Negative   Urobilinogen, UA 0.2 0.2 or 1.0 E.U./dL   Nitrite, UA Negative    Leukocytes, UA Negative Negative       Assessment and Plan   #Tinnitus in both ears- louder on the right S: Started about four months ago. Denies any pain or pressure. No changes in hearing or injury. Vibration type sounds. Has not noted wax in the ears. Not always aware of it- more aware of it at night.  The vast majority of the time does not have pulsatile sensation to it.   She was worried about BP but BP looks good today A/P: Tinnitus likely hearing loss related-offered referral to ENT for testing but she would like to hold off for now. If she has new or worsening symptoms she will let us know  # Cough/GERD S:States has been having really bad indigestion.  Patient woke up with feeling like she had aspirated about a week ago- had burning through her chest when felt like she aspirated. Started with deep cough over the weekend. Denies any fever, loss of taste or smell, nausea or vomiting. She has had some chills.   She is compliant with omeprazole about 5 days a week. She feels like has thick mucusy cough from the lungs  She was vaccinated for covid in February with 2nd vaccine by [employer].  A/P: Possible aspiration a week ago-we will get a chest x-ray to make sure there is no aspiration pneumonia given her concern of the cough. -Also with history of GERD-recommended she take omeprazole daily at least for the next 2 weeks to see if that helps with the cough  Patient with symptoms concerning for potential covid 19 but she is fully vaccinated.  Therefore: -information provided on testing scheduling "please text "COVID" to [phone], OR you can log on to [email or web address] to easily make  an on-line appointment. "   - recommended patient watch closely for shortness of breath or confusion or worsening symptoms and if those occur he should contact us immediately  -recommended self isolation until negative test  at minimum .   # Odor with Urine S:Patient has noticed change in color and odor of urine. Has had frequent UTI in past and started with same symptoms. Off methanamine since winter.   Slight urgency/frequency with this.  A/P: Patient with history of recurrent UTIs  previously followed by [facility] she states. She is off methenamine since December. Similar symptoms to prior-we'll update urinalysis and urine culture  # B12 deficiency S: Current treatment/medication (oral vs. IM):  Injections once monthly at home  Lab Results  Component Value Date   VITAMINB12 168 (L) 08/04/2019  A/P: We discussed possibly transitioning to oral-she prefers the injections and she will continue these. She has actually been taking oral alongside the injections-I told her she only needs to take the injections   Recommended follow up: Return for as needed for new, worsening, persistent symptoms.  Lab/Order associations:   ICD-10-CM   1. Cough  R05 DG Chest 2 View  2. Gastroesophageal reflux disease, unspecified whether esophagitis present  K21.9   3. ANEMIA, B12 DEFICIENCY  D51.8   4. Tinnitus of both ears  H93.13   5. Frequency of urination  R35.0 Urine Culture    POCT Urinalysis Dipstick (Automated)  6. Screening for colorectal cancer  Z12.11 Ambulatory referral to Gastroenterology   Z12.12     Meds ordered this encounter  Medications  . cyanocobalamin (,VITAMIN B-12,) 1000 MCG/ML injection    Sig: Inject 1 mL (1,000 mcg total) into the muscle every 30 (thirty) days. Please provide syringes or fax [place] an order for syringes that work well with this injection    Dispense:  3 mL    Refill:  3  . omeprazole (PRILOSEC) 20 MG capsule    Sig: TAKE ONE (1) CAPSULE EACH DAY     Dispense:  90 capsule    Refill:  3    PATIENT REQUESTED 90 DAY SUPPLY   Return precautions advised.  [name], MD

## 2020-04-26 ENCOUNTER — Other Ambulatory Visit: Payer: Self-pay

## 2020-04-26 ENCOUNTER — Encounter: Payer: Self-pay | Admitting: Family Medicine

## 2020-04-26 ENCOUNTER — Ambulatory Visit (INDEPENDENT_AMBULATORY_CARE_PROVIDER_SITE_OTHER): Payer: Medicare PPO

## 2020-04-26 ENCOUNTER — Ambulatory Visit (INDEPENDENT_AMBULATORY_CARE_PROVIDER_SITE_OTHER): Payer: Medicare PPO | Admitting: Family Medicine

## 2020-04-26 VITALS — BP 136/84 | HR 62 | Temp 98.6°F | Ht 66.0 in | Wt 216.0 lb

## 2020-04-26 DIAGNOSIS — K219 Gastro-esophageal reflux disease without esophagitis: Secondary | ICD-10-CM

## 2020-04-26 DIAGNOSIS — Z1211 Encounter for screening for malignant neoplasm of colon: Secondary | ICD-10-CM

## 2020-04-26 DIAGNOSIS — R05 Cough: Secondary | ICD-10-CM | POA: Diagnosis not present

## 2020-04-26 DIAGNOSIS — R059 Cough, unspecified: Secondary | ICD-10-CM

## 2020-04-26 DIAGNOSIS — Z1212 Encounter for screening for malignant neoplasm of rectum: Secondary | ICD-10-CM

## 2020-04-26 DIAGNOSIS — D518 Other vitamin B12 deficiency anemias: Secondary | ICD-10-CM | POA: Diagnosis not present

## 2020-04-26 DIAGNOSIS — H9313 Tinnitus, bilateral: Secondary | ICD-10-CM | POA: Diagnosis not present

## 2020-04-26 DIAGNOSIS — R350 Frequency of micturition: Secondary | ICD-10-CM

## 2020-04-26 LAB — POC URINALSYSI DIPSTICK (AUTOMATED)
Bilirubin, UA: NEGATIVE
Blood, UA: NEGATIVE
Glucose, UA: NEGATIVE
Ketones, UA: NEGATIVE
Leukocytes, UA: NEGATIVE
Nitrite, UA: NEGATIVE
Protein, UA: NEGATIVE
Spec Grav, UA: 1.015 (ref 1.010–1.025)
Urobilinogen, UA: 0.2 E.U./dL
pH, UA: 6 (ref 5.0–8.0)

## 2020-04-26 MED ORDER — OMEPRAZOLE 20 MG PO CPDR: DELAYED_RELEASE_CAPSULE | ORAL | 3 refills | Status: DC

## 2020-04-26 MED ORDER — CYANOCOBALAMIN 1000 MCG/ML IJ SOLN: 1000.0000 ug | INTRAMUSCULAR | 3 refills | Status: DC

## 2020-04-28 LAB — URINE CULTURE
MICRO NUMBER:: 10506258
SPECIMEN QUALITY:: ADEQUATE

## 2020-04-29 ENCOUNTER — Telehealth: Payer: Self-pay | Admitting: Gastroenterology

## 2020-04-29 ENCOUNTER — Other Ambulatory Visit: Payer: Self-pay | Admitting: Family Medicine

## 2020-04-29 MED ORDER — NITROFURANTOIN MONOHYD MACRO 100 MG PO CAPS
100.0000 mg | ORAL_CAPSULE | Freq: Two times a day (BID) | ORAL | 0 refills | Status: DC
Start: 2020-04-29 — End: 2020-08-05

## 2020-04-29 NOTE — Telephone Encounter (Signed)
In general prefer to avoid transfer between providers for continuity of care.  It is fine if Dr. Rush Landmark agrees.

## 2020-04-29 NOTE — Telephone Encounter (Signed)
Hi Dr. Silverio Decamp,  I called the patient to schedule recall and she has requested to continue care and have her procedure with Dr. Rush Landmark.   Please advise on approval\Scheduling  Thank you

## 2020-04-30 ENCOUNTER — Other Ambulatory Visit: Payer: Self-pay

## 2020-04-30 ENCOUNTER — Ambulatory Visit: Payer: Medicare PPO | Attending: Internal Medicine

## 2020-04-30 DIAGNOSIS — Z20822 Contact with and (suspected) exposure to covid-19: Secondary | ICD-10-CM

## 2020-05-01 LAB — NOVEL CORONAVIRUS, NAA: SARS-CoV-2, NAA: NOT DETECTED

## 2020-05-01 LAB — SARS-COV-2, NAA 2 DAY TAT

## 2020-05-01 NOTE — Telephone Encounter (Signed)
Hi Dr. Rush Landmark,  Please refer to the message below and advise on approval for scheduling.  Thank you

## 2020-05-03 NOTE — Telephone Encounter (Signed)
I am happy to accept the patient. Please move forward with scheduling colonoscopy. GM

## 2020-05-08 ENCOUNTER — Encounter: Payer: Self-pay | Admitting: Gastroenterology

## 2020-06-13 ENCOUNTER — Encounter: Payer: Self-pay | Admitting: Gastroenterology

## 2020-07-05 DIAGNOSIS — U071 COVID-19: Secondary | ICD-10-CM | POA: Insufficient documentation

## 2020-07-10 ENCOUNTER — Ambulatory Visit
Admission: EM | Admit: 2020-07-10 | Discharge: 2020-07-10 | Disposition: A | Discharge status: Home / Self Care | Payer: Medicare PPO | Attending: Emergency Medicine | Admitting: Emergency Medicine

## 2020-07-10 ENCOUNTER — Other Ambulatory Visit: Payer: Self-pay

## 2020-07-10 DIAGNOSIS — N390 Urinary tract infection, site not specified: Secondary | ICD-10-CM | POA: Insufficient documentation

## 2020-07-10 LAB — POCT URINALYSIS DIP (MANUAL ENTRY)
Bilirubin, UA: NEGATIVE
Glucose, UA: NEGATIVE mg/dL
Ketones, POC UA: NEGATIVE mg/dL
Nitrite, UA: NEGATIVE
Protein Ur, POC: NEGATIVE mg/dL
Spec Grav, UA: >=1.03 — AB (ref 1.010–1.025)
Urobilinogen, UA: 0.2 E.U./dL
pH, UA: 5 (ref 5.0–8.0)

## 2020-07-10 MED ORDER — CEPHALEXIN 500 MG PO CAPS
500.0000 mg | ORAL_CAPSULE | Freq: Two times a day (BID) | ORAL | 0 refills | Status: AC
Start: 2020-07-10 — End: 2020-07-17

## 2020-07-10 MED ORDER — PHENAZOPYRIDINE HCL 100 MG PO TABS: 100.0000 mg | ORAL_TABLET | Freq: Three times a day (TID) | ORAL | 0 refills | Status: DC | PRN

## 2020-07-10 NOTE — ED Provider Notes (Signed)
Alexis Wallace   Chief Complaint  Patient presents with  . Dysuria     SUBJECTIVE:  Alexis Wallace is a 64 y.o. female who complains of urinary frequency, urgency and dysuria for the past 1 week.  Patient denies a precipitating event, recent sexual encounter, excessive caffeine intake.  Localizes the pain to the lower abdomen.  Pain is intermittent and describes it as sharp.  Has tried OTC medications without relief.  Symptoms are made worse with urination.  Admits to similar symptoms in the past.  Denies fever, chills, nausea, vomiting, abdominal pain, flank pain, abnormal vaginal discharge or bleeding, hematuria.    LMP: No LMP recorded. Patient is postmenopausal.  ROS: As in HPI.  All other pertinent ROS negative.     Past Medical History:  Diagnosis Date  . Allergy   . Anemia, iron deficiency   . B12 deficiency   . CAP (community acquired pneumonia) 11/18/2014  . GERD (gastroesophageal reflux disease)   . Iron deficiency   . Osteoarthritis   . Osteopenia   . Solitary pulmonary nodule 12/05/2014   Dx 11/19/14  3 mm > f/u 11/27/2015 > resolved, no further studies rec    . Tibial plateau fracture    june 2020- murphy/wainer cared for her  . Urinary tract infection    Past Surgical History:  Procedure Laterality Date  . carpal tunnel right    . COSMETIC SURGERY     tummy tuck  . GASTRIC BYPASS  2001   No Known Allergies No current facility-administered medications on file prior to encounter.   Current Outpatient Medications on File Prior to Encounter  Medication Sig Dispense Refill  . calcium carbonate 200 MG capsule Take 3,000 mg by mouth daily.    . cyanocobalamin (,VITAMIN B-12,) 1000 MCG/ML injection Inject 1 mL (1,000 mcg total) into the muscle every 30 (thirty) days. Please provide syringes or fax [place] an order for syringes that work well with this injection 3 mL 3  . estradiol (ESTRACE) 2 MG tablet Take 1 mg by mouth daily.     . fluticasone (FLONASE)  50 MCG/ACT nasal spray USE 2 SPRAYS IN EACH NOSTRIL ONCE DAILY 16 g 1  . MELATONIN PO Take by mouth.    . nitrofurantoin, macrocrystal-monohydrate, (MACROBID) 100 MG capsule Take 1 capsule (100 mg total) by mouth 2 (two) times daily. 14 capsule 0  . omeprazole (PRILOSEC) 20 MG capsule TAKE ONE (1) CAPSULE EACH DAY 90 capsule 3  . OVER THE COUNTER MEDICATION B 12 injections once monthly.    Marland Kitchen OVER THE COUNTER MEDICATION Vitamin D 3 one capsule daily.    . progesterone (PROMETRIUM) 100 MG capsule Take 1 capsule by mouth daily.    . temazepam (RESTORIL) 15 MG capsule TAKE 1 CAPSULE (15 MG TOTAL) BY MOUTH ATBEDTIME AS NEEDED FOR SLEEP 30 capsule 3  . valACYclovir (VALTREX) 1000 MG tablet Take 2 pills twice a day for 1 day at first sign of cold sore 30 tablet 1   Social History   Socioeconomic History  . Marital status: Married    Spouse name: Not on file  . Number of children: Not on file  . Years of education: Not on file  . Highest education level: Not on file  Occupational History  . Not on file  Tobacco Use  . Smoking status: Former Smoker    Packs/day: 1.00    Years: 5.00    Pack years: 5.00    Types: Cigarettes  Quit date: 02/10/1978    Years since quitting: 42.4  . Smokeless tobacco: Never Used  Substance and Sexual Activity  . Alcohol use: No  . Drug use: No  . Sexual activity: Yes  Other Topics Concern  . Not on file  Social History Narrative   Married to [employer]. 2 kids. Alexis Wallace (his wife had TBI being struck by car) with daughter Alexis Wallace. Patient takes care of Alexis Wallace and son lives with her and husband) and [place] lives in [place] with no kids.       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  Raising granddaughter after daughter has TBI.       Hobbies: spending time with friends and family         Social Determinants of Health   Financial Resource Strain:   .  Difficulty of Paying Living Expenses:   Food Insecurity:   . Worried About Charity fundraiser in the Last Year:   . Arboriculturist in the Last Year:   Transportation Needs:   . Film/video editor (Medical):   Marland Kitchen Lack of Transportation (Non-Medical):   Physical Activity:   . Days of Exercise per Week:   . Minutes of Exercise per Session:   Stress:   . Feeling of Stress :   Social Connections:   . Frequency of Communication with Friends and Family:   . Frequency of Social Gatherings with Friends and Family:   . Attends Religious Services:   . Active Member of Clubs or Organizations:   . Attends Archivist Meetings:   Marland Kitchen Marital Status:   Intimate Partner Violence:   . Fear of Current or Ex-Partner:   . Emotionally Abused:   Marland Kitchen Physically Abused:   . Sexually Abused:    Family History  Problem Relation Age of Onset  . Heart disease Father   . Lung cancer Mother        stage IV, smoker  . Heart disease Sister        Hypertrophic cardiomyopathy  . Colon cancer Neg Hx   . Esophageal cancer Neg Hx   . Rectal cancer Neg Hx   . Stomach cancer Neg Hx     OBJECTIVE:  Vitals:   07/10/20 1354  BP: 123/77  Pulse: 67  Resp: 16  Temp: 97.9 F (36.6 C)  TempSrc: Oral  SpO2: 98%   General appearance: AOx3 in no acute distress HEENT: NCAT.  Oropharynx clear.  Lungs: clear to auscultation bilaterally without adventitious breath sounds Heart: regular rate and rhythm.  Radial pulses 2+ symmetrical bilaterally Abdomen: soft; non-distended; no tenderness; bowel sounds present; no guarding or rebound tenderness Back: no CVA tenderness Extremities: no edema; symmetrical with no gross deformities Skin: warm and dry Neurologic: Ambulates from chair to exam table without difficulty Psychological: alert and cooperative; normal mood and affect  Labs Reviewed  POCT URINALYSIS DIP (MANUAL ENTRY) - Abnormal; Notable for the following components:      Result Value   Clarity,  UA cloudy (*)    Spec Grav, UA >=1.030 (*)    Blood, UA large (*)    Leukocytes, UA Small (1+) (*)    All other components within normal limits  URINE CULTURE    ASSESSMENT & PLAN:  1. Acute lower UTI     Meds ordered this encounter  Medications  . cephALEXin (KEFLEX) 500 MG capsule  Sig: Take 1 capsule (500 mg total) by mouth 2 (two) times daily for 7 days.    Dispense:  14 capsule    Refill:  0  . phenazopyridine (PYRIDIUM) 100 MG tablet    Sig: Take 1 tablet (100 mg total) by mouth 3 (three) times daily as needed for pain.    Dispense:  10 tablet    Refill:  0   Patient stable at discharge.  POCT urine analysis show large amount of blood with small leukocyte.  We will treat the patient for possible UTI.  Will await culture result  Discharge instructions Urine culture sent.  We will call you with the results.   Push fluids and get plenty of rest.   Take antibiotic as directed and to completion Take pyridium as prescribed and as needed for symptomatic relief Follow up with PCP if symptoms persists Return here or go to ER if you have any new or worsening symptoms such as fever, worsening abdominal pain, nausea/vomiting, flank pain, etc...  Outlined signs and symptoms indicating need for more acute intervention. Patient verbalized understanding. After Visit Summary given.        Note: This document was prepared using Dragon voice recognition software and may include unintentional dictation errors.    Emerson Monte, FNP 07/10/20 1430

## 2020-07-10 NOTE — ED Triage Notes (Signed)
Has recurrent utis, having symptoms for over week , normally seen by urologist, unable to get in

## 2020-07-10 NOTE — Discharge Instructions (Addendum)
Urine culture sent.  We will call you with the results.   Push fluids and get plenty of rest.   Take antibiotic as directed and to completion Take pyridium as prescribed and as needed for symptomatic relief Follow up with PCP if symptoms persists Return here or go to ER if you have any new or worsening symptoms such as fever, worsening abdominal pain, nausea/vomiting, flank pain, etc... 

## 2020-07-12 ENCOUNTER — Encounter: Payer: Medicare PPO | Admitting: Gastroenterology

## 2020-07-12 DIAGNOSIS — Z20828 Contact with and (suspected) exposure to other viral communicable diseases: Secondary | ICD-10-CM | POA: Diagnosis not present

## 2020-07-12 LAB — URINE CULTURE: Culture: >=100000 — AB

## 2020-07-15 ENCOUNTER — Inpatient Hospital Stay: Admission: RE | Admit: 2020-07-15 | Payer: Medicare PPO | Source: Ambulatory Visit

## 2020-07-18 ENCOUNTER — Telehealth: Payer: Medicare PPO | Admitting: Family Medicine

## 2020-07-19 ENCOUNTER — Encounter: Payer: Self-pay | Admitting: Family Medicine

## 2020-07-19 ENCOUNTER — Telehealth (INDEPENDENT_AMBULATORY_CARE_PROVIDER_SITE_OTHER): Payer: Medicare PPO | Admitting: Family Medicine

## 2020-07-19 VITALS — Temp 97.7°F | Ht 66.0 in | Wt 216.0 lb

## 2020-07-19 DIAGNOSIS — U071 COVID-19: Secondary | ICD-10-CM

## 2020-07-19 NOTE — Progress Notes (Signed)
Phone [phone] Virtual visit via Video note   Subjective:  Chief complaint: Chief Complaint  Patient presents with  . Covid Exposure    pos   This visit type was conducted due to national recommendations for restrictions regarding the COVID-19 Pandemic (e.g. social distancing).  This format is felt to be most appropriate for this patient at this time balancing risks to patient and risks to population by having him in for in person visit.  No physical exam was performed (except for noted visual exam or audio findings with Telehealth visits).    Our team/I connected with Alexis Wallace at  3:40 PM EDT by a video enabled telemedicine application (doxy.me or caregility through epic) and verified that I am speaking with the correct person using two identifiers.  Location patient: [facility] Location provider: [facility], office Persons participating in the virtual visit:  patient  Our team/I discussed the limitations of evaluation and management by telemedicine and the availability of in person appointments. In light of current covid-19 pandemic, patient also understands that we are trying to protect them by minimizing in office contact if at all possible.  The patient expressed consent for telemedicine visit and agreed to proceed. Patient understands insurance will be billed.   Past Medical History-  Patient Active Problem List   Diagnosis Date Noted  . Recurrent UTI 04/07/2016    Priority: High  . Anxiety 12/13/2018    Priority: Medium  . Hyperlipidemia 05/20/2017    Priority: Medium  . History of adenomatous polyp of colon 10/12/2016    Priority: Medium  . Rosacea 08/30/2014    Priority: Medium  . Insomnia 08/30/2014    Priority: Medium  . Bariatric surgery status 09/01/2010    Priority: Medium  . 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 ANEMIA, B12 DEFICIENCY 08/15/2007    Priority: Medium  . Plantar fasciitis, right 12/13/2014    Priority: Low  .  Solitary pulmonary nodule 12/05/2014    Priority: Low  . CAP (community acquired pneumonia) 11/18/2014    Priority: Low  . Family history of hypertrophic cardiomyopathy 05/13/2011    Priority: Low  . Actinic keratosis 10/22/2010    Priority: Low  . 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 ALLERGIC RHINITIS 03/01/2008    Priority: Low  . ANEMIA-IRON DEFICIENCY 11/21/2007    Priority: Low  . GERD 06/02/2007    Priority: Low  . PSORIASIS 06/02/2007    Priority: Low  . OSTEOARTHRITIS 06/02/2007    Priority: Low  . OSTEOPENIA 06/02/2007    Priority: Low    Medications- reviewed and updated Current Outpatient Medications  Medication Sig Dispense Refill  . calcium carbonate 200 MG capsule Take 3,000 mg by mouth daily.    . cyanocobalamin (,VITAMIN B-12,) 1000 MCG/ML injection Inject 1 mL (1,000 mcg total) into the muscle every 30 (thirty) days. Please provide syringes or fax [place] an order for syringes that work well with this injection 3 mL 3  . estradiol (ESTRACE) 2 MG tablet Take 1 mg by mouth daily.     . fluticasone (FLONASE) 50 MCG/ACT nasal spray USE 2 SPRAYS IN EACH NOSTRIL ONCE DAILY 16 g 1  . MELATONIN PO Take by mouth.    Marland Kitchen omeprazole (PRILOSEC) 20 MG capsule TAKE ONE (1) CAPSULE EACH DAY 90 capsule 3  . OVER THE COUNTER MEDICATION B 12 injections once monthly.    Marland Kitchen OVER THE COUNTER MEDICATION Vitamin  D 3 one capsule daily.    . progesterone (PROMETRIUM) 100 MG capsule Take 1 capsule by mouth daily.    . temazepam (RESTORIL) 15 MG capsule TAKE 1 CAPSULE (15 MG TOTAL) BY MOUTH ATBEDTIME AS NEEDED FOR SLEEP 30 capsule 3  . valACYclovir (VALTREX) 1000 MG tablet Take 2 pills twice a day for 1 day at first sign of cold sore 30 tablet 1  . nitrofurantoin, macrocrystal-monohydrate, (MACROBID) 100 MG capsule Take 1 capsule (100 mg total) by mouth 2 (two) times daily. 14 capsule 0  . phenazopyridine (PYRIDIUM) 100 MG tablet Take 1 tablet (100 mg  total) by mouth 3 (three) times daily as needed for pain. 10 tablet 0   No current facility-administered medications for this visit.     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 She had a positive test on august 6th. Symptoms started on august 2- thought she was having allergies/headache then turned into a head cold. Lost taste and smell.   She has had significant fatigue- even taking 3 naps a day. Still has a phlegmy chest cough (has improved) and mild headache. No shortness of breath. Never had fever.   Patient has questions about what she needs to be on the look out for. If she should be going back to work next week.  A/P: 64 year old female fully vaccinated from COVID-19 with breakthrough COVID-19 infection likely from delta variant.  Patient seems to be doing reasonably well at home recovering.  We discussed typical course of illness.  Technically she is free from quarantine since she has had significant improvement in respiratory symptoms and never had a fever in past 10 days of symptoms.  We discussed potential risks for secondary bacterial infection and signs and symptoms to watch out for-she will let me know if worsening symptoms.   Recommended follow up: As needed for acute concerns Future Appointments  Date Time Provider [place]  08/09/2020 10:00 AM [facility] PREVISIT RM [facility]  08/22/2020  9:00 AM Mansouraty, Telford Nab., MD [facility]    Lab/Order associations:   ICD-10-CM   1. COVID-19  U07.1    Time Spent: 17 minutes of total time (3:38  PM- 3:55 PM) was spent on the date of the encounter performing the following actions: chart review prior to seeing the patient, obtaining history, performing a medically necessary exam, counseling on the treatment plan, placing orders, and documenting in our EHR.   Return precautions advised.  [name], MD

## 2020-07-19 NOTE — Patient Instructions (Signed)
Health Maintenance Due  Topic Date Due  . COLONOSCOPY  10/03/2019  . INFLUENZA VACCINE  07/07/2020    Depression screen [facility] 2/9 04/26/2020 08/04/2019 12/13/2018  Decreased Interest 0 0 0  Down, Depressed, Hopeless 0 0 0  PHQ - 2 Score 0 0 0  Altered sleeping 0 - -  Tired, decreased energy 0 - -  Change in appetite 0 - -  Feeling bad or failure about yourself  0 - -  Trouble concentrating 0 - -  Moving slowly or fidgety/restless 0 - -  Suicidal thoughts 0 - -  PHQ-9 Score 0 - -  Difficult doing work/chores Not difficult at all - -    Recommended follow up: No follow-ups on file.

## 2020-07-22 ENCOUNTER — Encounter: Payer: Self-pay | Admitting: Family Medicine

## 2020-07-22 MED ORDER — CEPHALEXIN 500 MG PO CAPS
500.0000 mg | ORAL_CAPSULE | Freq: Four times a day (QID) | ORAL | 0 refills | Status: AC
Start: 2020-07-22 — End: 2020-07-29

## 2020-08-03 ENCOUNTER — Encounter: Payer: Self-pay | Admitting: Family Medicine

## 2020-08-05 ENCOUNTER — Other Ambulatory Visit: Payer: Self-pay

## 2020-08-05 ENCOUNTER — Ambulatory Visit (AMBULATORY_SURGERY_CENTER): Payer: Self-pay | Admitting: *Deleted

## 2020-08-05 ENCOUNTER — Encounter: Payer: Self-pay | Admitting: Family Medicine

## 2020-08-05 ENCOUNTER — Ambulatory Visit (INDEPENDENT_AMBULATORY_CARE_PROVIDER_SITE_OTHER): Payer: Medicare PPO | Admitting: Family Medicine

## 2020-08-05 VITALS — Ht 66.0 in | Wt 210.0 lb

## 2020-08-05 VITALS — BP 120/76 | HR 60 | Temp 98.6°F | Ht 66.0 in | Wt 211.0 lb

## 2020-08-05 DIAGNOSIS — N3000 Acute cystitis without hematuria: Secondary | ICD-10-CM | POA: Diagnosis not present

## 2020-08-05 DIAGNOSIS — Z86010 Personal history of colonic polyps: Secondary | ICD-10-CM

## 2020-08-05 DIAGNOSIS — R300 Dysuria: Secondary | ICD-10-CM | POA: Diagnosis not present

## 2020-08-05 LAB — POC URINALSYSI DIPSTICK (AUTOMATED)
Bilirubin, UA: NEGATIVE
Blood, UA: NEGATIVE
Glucose, UA: NEGATIVE
Ketones, UA: NEGATIVE
Leukocytes, UA: NEGATIVE
Nitrite, UA: NEGATIVE
Protein, UA: NEGATIVE
Spec Grav, UA: 1.015 (ref 1.010–1.025)
Urobilinogen, UA: 0.2 E.U./dL
pH, UA: 5.5 (ref 5.0–8.0)

## 2020-08-05 MED ORDER — SULFAMETHOXAZOLE-TRIMETHOPRIM 800-160 MG PO TABS: 1.0000 | ORAL_TABLET | Freq: Two times a day (BID) | ORAL | 0 refills | Status: DC

## 2020-08-05 NOTE — Patient Instructions (Addendum)
Health Maintenance Due  Topic Date Due  . COLONOSCOPY  Has app next week.  10/03/2019  . INFLUENZA VACCINE - - will complete later in flu season (please let us know if you get this at another location so we can update your chart) . We should have vaccination here in 1-2 months - can call back for an appointment.   07/07/2020   I am sorry to hear that things have not cleared up on Keflex.  Lets try different antibiotic Bactrim.  We will also get urine culture to make sure this is the appropriate antibiotic-we may have to switch depending on results.

## 2020-08-05 NOTE — Telephone Encounter (Signed)
Patient has been scheduled

## 2020-08-05 NOTE — Progress Notes (Signed)

## 2020-08-05 NOTE — Progress Notes (Signed)
Phone [phone] In person visit   Subjective:   Alexis Wallace is a 64 y.o. year old very pleasant female patient who presents for/with See problem oriented charting Chief Complaint  Patient presents with  . Urinary Frequency    x 4 days   . Urinary Urgency   This visit occurred during the SARS-CoV-2 public health emergency.  Safety protocols were in place, including screening questions prior to the visit, additional usage of staff PPE, and extensive cleaning of exam room while observing appropriate contact time as indicated for disinfecting solutions.   Past Medical History-  Patient Active Problem List   Diagnosis Date Noted  . Recurrent UTI 04/07/2016    Priority: High  . Anxiety 12/13/2018    Priority: Medium  . Hyperlipidemia 05/20/2017    Priority: Medium  . History of adenomatous polyp of colon 10/12/2016    Priority: Medium  . Rosacea 08/30/2014    Priority: Medium  . Insomnia 08/30/2014    Priority: Medium  . Bariatric surgery status 09/01/2010    Priority: Medium  . 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 ANEMIA, B12 DEFICIENCY 08/15/2007    Priority: Medium  . Plantar fasciitis, right 12/13/2014    Priority: Low  . Solitary pulmonary nodule 12/05/2014    Priority: Low  . CAP (community acquired pneumonia) 11/18/2014    Priority: Low  . Family history of hypertrophic cardiomyopathy 05/13/2011    Priority: Low  . Actinic keratosis 10/22/2010    Priority: Low  . 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 ALLERGIC RHINITIS 03/01/2008    Priority: Low  . ANEMIA-IRON DEFICIENCY 11/21/2007    Priority: Low  . GERD 06/02/2007    Priority: Low  . PSORIASIS 06/02/2007    Priority: Low  . OSTEOARTHRITIS 06/02/2007    Priority: Low  . OSTEOPENIA 06/02/2007    Priority: Low    Medications- reviewed and updated Current Outpatient Medications  Medication Sig Dispense Refill  . bimatoprost  (LUMIGAN) 0.03 % ophthalmic solution SMARTSIG:1 Drop(s) In Eye(s) Every Evening    . calcium carbonate 200 MG capsule Take 3,000 mg by mouth daily.    . cyanocobalamin (,VITAMIN B-12,) 1000 MCG/ML injection Inject 1 mL (1,000 mcg total) into the muscle every 30 (thirty) days. Please provide syringes or fax [place] an order for syringes that work well with this injection 3 mL 3  . estradiol (ESTRACE) 2 MG tablet Take 1 mg by mouth daily.     . fluticasone (FLONASE) 50 MCG/ACT nasal spray USE 2 SPRAYS IN EACH NOSTRIL ONCE DAILY 16 g 1  . Meth-Hyo-M Bl-Na Phos-Ph Sal (URIBEL PO) Take by mouth.    . Multiple Vitamin (MULTIVITAMIN) tablet Take 1 tablet by mouth daily.    Marland Kitchen omeprazole (PRILOSEC) 20 MG capsule TAKE ONE (1) CAPSULE EACH DAY 90 capsule 3  . OVER THE COUNTER MEDICATION Vitamin D 3 one capsule daily.    . progesterone (PROMETRIUM) 100 MG capsule Take 1 capsule by mouth daily.    . temazepam (RESTORIL) 15 MG capsule TAKE 1 CAPSULE (15 MG TOTAL) BY MOUTH ATBEDTIME AS NEEDED FOR SLEEP 30 capsule 3  . valACYclovir (VALTREX) 1000 MG tablet Take 2 pills twice a day for 1 day at first sign of cold sore 30 tablet 1   No current facility-administered medications for this visit.     Objective:  BP 120/76   Pulse 60   Temp 98.6 F (  37 C) (Temporal)   Ht 5\' 6"  (1.676 m)   Wt 211 lb (95.7 kg)   SpO2 98%   BMI 34.06 kg/m  Gen: NAD, resting comfortably CV: RRR no murmurs rubs or gallops Lungs: CTAB no crackles, wheeze, rhonchi Abdomen: soft/nontender/nondistended/normal bowel sounds.  Mild low back tenderness- no cva tenderness Ext: no edema Skin: warm, dry  Results for orders placed or performed in visit on 08/05/20 (from the past 24 hour(s))  POCT Urinalysis Dipstick (Automated)     Status: None   Collection Time: 08/05/20 10:03 AM  Result Value Ref Range   Color, UA green    Clarity, UA clear    Glucose, UA Negative Negative   Bilirubin, UA neg    Ketones, UA neg    Spec Grav, UA  1.015 1.010 - 1.025   Blood, UA neg    pH, UA 5.5 5.0 - 8.0   Protein, UA Negative Negative   Urobilinogen, UA 0.2 0.2 or 1.0 E.U./dL   Nitrite, UA neg    Leukocytes, UA Negative Negative        Assessment and Plan  #Concern for UTI-in patient with history of recurrent UTI followed by [facility] (she was unable to get an appointment until later this month- she is not on empiric treatment- has an [facility] appointment on the 10th) S: Patients symptoms started 08/01/2020 most recently.  Of note patient was seen in the urgent care July 10, 2020 and treated with Keflex500 mg twice daily-Had 100,000 colonies of Klebsiella sensitive to cephalosporins, ciprofloxacin, Zosyn, Bactrim.  Resistant to ampicillin and nitrofurantoin.  I reviewed urgent care note from July 10 2020-1-week of symptoms prior to that visit.  Patient reached out to me on August 16 and we increased Keflex dose to 500 mg 4 times a day for 7 days- finished last Monday or Tuesday but symptoms did not completely resolve and by Thursday symptoms came back. Symptoms worsened through the weekend.   Of note patient also with COVID-19 likely delta variant as had breakthrough case discussed July 19, 2020.  Complains of dysuria:yes ; polyuria: yes  Nocturia: yes ; urgency: yes with spasms and back pain . Some chills. Mild nausea.  Symptoms are has had some improvement with uibel but symptoms increase when not taking.  ROS- no fever, nausea, vomiting. No blood in urine- gets blood from vagina at times- working with gynecology.  A/P: UA reassuring. Likely UTI. Will get culture. Empiric treatment with: bactrim (extending to 7 days with recurrent issues) Patient to follow up if new or worsening symptoms or failure to improve.   Recommended follow up:  Future Appointments  Date Time Provider [place]  08/16/2020  2:00 PM McKenzie, Candee Furbish, MD [facility] None  08/22/2020  9:00 AM Mansouraty, Telford Nab., MD [facility]  [facility]    Lab/Order associations:   ICD-10-CM   1. Dysuria  R30.0 POCT Urinalysis Dipstick (Automated)    Urine Culture    Meds ordered this encounter  Medications  . sulfamethoxazole-trimethoprim (BACTRIM DS) 800-160 MG tablet    Sig: Take 1 tablet by mouth 2 (two) times daily.    Dispense:  14 tablet    Refill:  0   Return precautions advised.  [name], MD

## 2020-08-06 ENCOUNTER — Encounter: Payer: Self-pay | Admitting: Family Medicine

## 2020-08-06 LAB — URINE CULTURE
MICRO NUMBER:: 10888027
SPECIMEN QUALITY:: ADEQUATE

## 2020-08-16 ENCOUNTER — Other Ambulatory Visit: Payer: Self-pay

## 2020-08-16 ENCOUNTER — Encounter: Payer: Self-pay | Admitting: Urology

## 2020-08-16 ENCOUNTER — Ambulatory Visit (INDEPENDENT_AMBULATORY_CARE_PROVIDER_SITE_OTHER): Payer: Medicare PPO | Admitting: Urology

## 2020-08-16 VITALS — BP 120/73 | HR 69 | Temp 97.6°F | Ht 67.0 in | Wt 211.0 lb

## 2020-08-16 DIAGNOSIS — N390 Urinary tract infection, site not specified: Secondary | ICD-10-CM

## 2020-08-16 LAB — POCT URINALYSIS DIPSTICK
Bilirubin, UA: NEGATIVE
Glucose, UA: NEGATIVE
Ketones, UA: NEGATIVE
Leukocytes, UA: NEGATIVE
Nitrite, UA: NEGATIVE
Protein, UA: NEGATIVE
Spec Grav, UA: 1.01 (ref 1.010–1.025)
Urobilinogen, UA: 0.2 E.U./dL
pH, UA: 6 (ref 5.0–8.0)

## 2020-08-16 MED ORDER — MIRABEGRON ER 25 MG PO TB24
25.0000 mg | ORAL_TABLET | Freq: Every day | ORAL | 0 refills | Status: DC
Start: 2020-08-16 — End: 2020-09-16

## 2020-08-16 NOTE — Progress Notes (Signed)
Urological Symptom Review  Patient is experiencing the following symptoms: Hard to postpone urination Leakage of urine Urinary tract infection  Blood in urine Kidney stones  Review of Systems  Gastrointestinal (upper)  : Negative for upper GI symptoms  Gastrointestinal (lower) : Negative for lower GI symptoms  Constitutional : Negative for symptoms  Skin: Negative for skin symptoms  Eyes: Negative for eye symptoms  Ear/Nose/Throat : Negative for Ear/Nose/Throat symptoms  Hematologic/Lymphatic: Negative for Hematologic/Lymphatic symptoms  Cardiovascular : Negative for cardiovascular symptoms  Respiratory : Negative for respiratory symptoms  Endocrine: Negative for endocrine symptoms  Musculoskeletal: Negative for musculoskeletal symptoms  Neurological: Negative for neurological symptoms  Psychologic: Negative for psychiatric symptoms

## 2020-08-16 NOTE — Progress Notes (Signed)
08/16/2020 2:26 PM   Alexis Wallace 1955/12/09 [id number]  Referring provider: Marin Olp, MD [place],  [place] [address]  Recurrent UTI  HPI: Alexis Wallace is a [phone] seen for evaluation of recurrent UTI. She was previously seen by Dr. Era Bumpers and then was followed by [facility]. She was on macrobid which helped prevent the UTI. She was last seen at [facility] 18 months ago. She then stopped the medication 1 year ago. In early August she developed Klebsiella UTI and then followup culture was mixed growth. Currently she has urinary urgency and frequency.  UA today shows 1+ blood.    PMH: Past Medical History:  Diagnosis Date  . Allergy   . Anemia, iron deficiency   . B12 deficiency   . CAP (community acquired pneumonia) 11/18/2014  . GERD (gastroesophageal reflux disease)   . Heartburn   . Iron deficiency   . Osteoarthritis   . Osteopenia   . Solitary pulmonary nodule 12/05/2014   Dx 11/19/14  3 mm > f/u 11/27/2015 > resolved, no further studies rec    . Tibial plateau fracture    june 2020- murphy/wainer cared for her  . Urinary tract infection     Surgical History: Past Surgical History:  Procedure Laterality Date  . carpal tunnel right    . COLONOSCOPY    . COSMETIC SURGERY     tummy tuck  . GASTRIC BYPASS  2001  . POLYPECTOMY      Home Medications:  Allergies as of 08/16/2020   No Known Allergies     Medication List       Accurate as of August 16, 2020  2:26 PM. If you have any questions, ask your nurse or doctor.        bimatoprost 0.03 % ophthalmic solution Commonly known as: LUMIGAN SMARTSIG:1 Drop(s) In Eye(s) Every Evening   calcium carbonate 200 MG capsule Take 3,000 mg by mouth daily.   cyanocobalamin 1000 MCG/ML injection Commonly known as: (VITAMIN B-12) Inject 1 mL (1,000 mcg total) into the muscle every 30 (thirty) days. Please provide syringes or fax [place] an order for syringes that work well with this  injection   estradiol 2 MG tablet Commonly known as: ESTRACE Take 1 mg by mouth daily.   fluticasone 50 MCG/ACT nasal spray Commonly known as: FLONASE USE 2 SPRAYS IN EACH NOSTRIL ONCE DAILY   multivitamin tablet Take 1 tablet by mouth daily.   omeprazole 20 MG capsule Commonly known as: PRILOSEC TAKE ONE (1) CAPSULE EACH DAY   OVER THE COUNTER MEDICATION Vitamin D 3 one capsule daily.   progesterone 100 MG capsule Commonly known as: PROMETRIUM Take 1 capsule by mouth daily.   sulfamethoxazole-trimethoprim 800-160 MG tablet Commonly known as: BACTRIM DS Take 1 tablet by mouth 2 (two) times daily.   temazepam 15 MG capsule Commonly known as: RESTORIL TAKE 1 CAPSULE (15 MG TOTAL) BY MOUTH ATBEDTIME AS NEEDED FOR SLEEP   URIBEL PO Take by mouth.   valACYclovir 1000 MG tablet Commonly known as: VALTREX Take 2 pills twice a day for 1 day at first sign of cold sore       Allergies: No Known Allergies  Family History: Family History  Problem Relation Age of Onset  . Heart disease Father   . Lung cancer Mother        stage IV, smoker  . Heart disease Sister        Hypertrophic cardiomyopathy  . Uterine cancer  Sister   . Colon cancer Neg Hx   . Esophageal cancer Neg Hx   . Rectal cancer Neg Hx   . Stomach cancer Neg Hx   . Colon polyps Neg Hx     Social History:  reports that she quit smoking about 42 years ago. Her smoking use included cigarettes. She has a 5.00 pack-year smoking history. She has never used smokeless tobacco. She reports that she does not drink alcohol and does not use drugs.  ROS: All other review of systems were reviewed and are negative except what is noted above in HPI  Physical Exam: BP 120/73   Pulse 69   Temp 97.6 F (36.4 C)   Ht 5\' 6"  (1.676 m)   Wt 211 lb (95.7 kg)   BMI 34.06 kg/m   Constitutional:  Alert and oriented, No acute distress. HEENT: [place] AT, moist mucus membranes.  Trachea midline, no masses. Cardiovascular: No  clubbing, cyanosis, or edema. Respiratory: Normal respiratory effort, no increased work of breathing. GI: Abdomen is soft, nontender, nondistended, no abdominal masses GU: No CVA tenderness.  Lymph: No cervical or inguinal lymphadenopathy. Skin: No rashes, bruises or suspicious lesions. Neurologic: Grossly intact, no focal deficits, moving all 4 extremities. Psychiatric: Normal mood and affect.  Laboratory Data: Lab Results  Component Value Date   WBC 5.1 08/04/2019   HGB 13.4 08/04/2019   HCT 39.9 08/04/2019   MCV 92.9 08/04/2019   PLT 198.0 08/04/2019    Lab Results  Component Value Date   CREATININE 0.65 08/04/2019    No results found for: PSA  No results found for: TESTOSTERONE  No results found for: HGBA1C  Urinalysis    Component Value Date/Time   COLORURINE AMBER (A) 09/13/2016 0555   APPEARANCEUR CLOUDY (A) 09/13/2016 0555   LABSPEC >1.030 (H) 09/13/2016 0555   PHURINE 6.5 09/13/2016 0555   GLUCOSEU NEGATIVE 09/13/2016 0555   GLUCOSEU NEGATIVE 08/25/2010 0837   HGBUR LARGE (A) 09/13/2016 0555   HGBUR trace-lysed 06/18/2009 0829   BILIRUBINUR negative 08/16/2020 1407   KETONESUR negative 07/10/2020 1404   KETONESUR TRACE (A) 09/13/2016 0555   PROTEINUR Negative 08/16/2020 1407   PROTEINUR >300 (A) 09/13/2016 0555   UROBILINOGEN 0.2 08/16/2020 1407   UROBILINOGEN 0.2 08/25/2010 0837   NITRITE negative 08/16/2020 1407   NITRITE NEGATIVE 09/13/2016 0555   LEUKOCYTESUR Negative 08/16/2020 1407    Lab Results  Component Value Date   BACTERIA MANY (A) 09/13/2016    Pertinent Imaging:  Results for orders placed in visit on 08/04/02  DG Abd 1 View  Narrative FINDINGS CLINICAL DATA:  LITHOTRIPSY, MAY OF 2003.  QUESTION RENAL CALCULI. ONE VIEW ABDOMEN - 08/04/02 COMPARISON 05/03/02. THERE ARE NO DEFINITE RENAL OR URETERAL CALCULI, AND THE BOWEL GAS PATTERN IS NORMAL. IMPRESSION NO DEFINITE RENAL OR URETERAL CALCULI.  No results found for this or any  previous visit.  No results found for this or any previous visit.  No results found for this or any previous visit.  No results found for this or any previous visit.  No results found for this or any previous visit.  No results found for this or any previous visit.  No results found for this or any previous visit.   Assessment & Plan:    1. Recurrent UTI --We discussed the natural hx of recurrent UTIs and the various causes. We discussed the treatment options including post coital prophylaxis, daily prophylaxis, topical estrogen therapy. -UA for microscopy - POCT urinalysis dipstick - BLADDER  SCAN AMB NON-IMAGING -sample of mirabegron 25mg  given. Patient to call if she desires rx.    No follow-ups on file.  Nicolette Bang, MD  [facility] [place]

## 2020-08-16 NOTE — Patient Instructions (Signed)

## 2020-08-19 DIAGNOSIS — N938 Other specified abnormal uterine and vaginal bleeding: Secondary | ICD-10-CM | POA: Diagnosis not present

## 2020-08-21 ENCOUNTER — Encounter: Payer: Self-pay | Admitting: Urology

## 2020-08-22 ENCOUNTER — Ambulatory Visit (AMBULATORY_SURGERY_CENTER): Payer: Medicare PPO | Admitting: Gastroenterology

## 2020-08-22 ENCOUNTER — Encounter: Payer: Self-pay | Admitting: Gastroenterology

## 2020-08-22 ENCOUNTER — Other Ambulatory Visit: Payer: Self-pay

## 2020-08-22 VITALS — BP 130/74 | HR 60 | Temp 97.1°F | Resp 16 | Ht 66.0 in | Wt 210.0 lb

## 2020-08-22 DIAGNOSIS — K621 Rectal polyp: Secondary | ICD-10-CM | POA: Diagnosis not present

## 2020-08-22 DIAGNOSIS — K635 Polyp of colon: Secondary | ICD-10-CM

## 2020-08-22 DIAGNOSIS — D123 Benign neoplasm of transverse colon: Secondary | ICD-10-CM

## 2020-08-22 DIAGNOSIS — Z860100 Personal history of colon polyps, unspecified: Secondary | ICD-10-CM

## 2020-08-22 DIAGNOSIS — K219 Gastro-esophageal reflux disease without esophagitis: Secondary | ICD-10-CM | POA: Diagnosis not present

## 2020-08-22 MED ORDER — SODIUM CHLORIDE 0.9 % IV SOLN
500.0000 mL | Freq: Once | INTRAVENOUS | Status: DC
Start: 2020-08-22 — End: 2020-08-22

## 2020-08-22 NOTE — Progress Notes (Signed)
Called to room to assist during endoscopic procedure.  Patient ID and intended procedure confirmed with present staff. Received instructions for my participation in the procedure from the performing physician.  

## 2020-08-22 NOTE — Progress Notes (Signed)
VS-JK  Pt's states no medical or surgical changes since previsit or office visit.

## 2020-08-22 NOTE — Progress Notes (Signed)
Report to PACU, RN, vss, BBS= Clear.  

## 2020-08-22 NOTE — Op Note (Addendum)
[place] Patient Name: Sevana Grandinetti Procedure Date: 08/22/2020 8:25 AM MRN: [id number] Endoscopist: Justice Britain , MD Age: 64 Referring MD:  Date of Birth: 1956-07-01 Gender: Female Account #: [id number] Procedure:                Colonoscopy Indications:              Surveillance: Personal history of adenomatous                            polyps on last colonoscopy 5 years ago Medicines:                Monitored Anesthesia Care Procedure:                Pre-Anesthesia Assessment:                           - Prior to the procedure, a History and Physical                            was performed, and patient medications and                            allergies were reviewed. The patient's tolerance of                            previous anesthesia was also reviewed. The risks                            and benefits of the procedure and the sedation                            options and risks were discussed with the patient.                            All questions were answered, and informed consent                            was obtained. Prior Anticoagulants: The patient has                            taken no previous anticoagulant or antiplatelet                            agents. ASA Grade Assessment: II - A patient with                            mild systemic disease. After reviewing the risks                            and benefits, the patient was deemed in                            satisfactory condition to undergo the procedure.  After obtaining informed consent, the colonoscope                            was passed under direct vision. Throughout the                            procedure, the patient's blood pressure, pulse, and                            oxygen saturations were monitored continuously. The                            Colonoscope was introduced through the anus and                            advanced to the 5 cm  into the ileum. The                            colonoscopy was performed without difficulty. The                            patient tolerated the procedure. The quality of the                            bowel preparation was adequate. The terminal ileum,                            ileocecal valve, appendiceal orifice, and rectum                            were photographed. Scope In: 8:39:10 AM Scope Out: 8:59:51 AM Scope Withdrawal Time: 0 hours 14 minutes 44 seconds  Total Procedure Duration: 0 hours 20 minutes 41 seconds  Findings:                 The digital rectal exam findings include                            hemorrhoids. Pertinent negatives include no                            palpable rectal lesions.                           Extensive amounts of liquid semi-liquid stool was                            found in the entire colon, interfering with                            visualization. Lavage of the area was performed                            using copious amounts, resulting in clearance with  adequate visualization.                           The terminal ileum and ileocecal valve appeared                            normal.                           A 5 mm polyp was found in the hepatic flexure. The                            polyp was sessile. The polyp was removed with a                            cold snare. Resection and retrieval were complete.                           Localized areas of moderately erythematous mucosa                            were found in the mid rectum and distal rectum.                            Biopsies were taken with a cold forceps for                            histology to rule out chronic proctitis.                           Normal mucosa was found in the entire colon                            otherwise                           Non-bleeding non-thrombosed internal hemorrhoids                            were  found during retroflexion, during perianal                            exam and during digital exam. The hemorrhoids were                            Grade II (internal hemorrhoids that prolapse but                            reduce spontaneously). Complications:            No immediate complications. Estimated Blood Loss:     Estimated blood loss was minimal. Impression:               - Hemorrhoids found on digital rectal exam.                           -  Stool in the entire examined colon. Lavaged with                            adequate visualization.                           - The examined portion of the ileum was normal.                           - One 5 mm polyp at the hepatic flexure, removed                            with a cold snare. Resected and retrieved.                           - Erythematous mucosa in the mid rectum and distal                            rectum. Biopsied to rule out chronic proctitis.                            Felt likely preparation artifact.                           - Normal mucosa in the entire examined colon                            otherwise.                           - Non-bleeding non-thrombosed internal hemorrhoids. Recommendation:           - The patient will be observed post-procedure,                            until all discharge criteria are met.                           - Discharge patient to home.                           - Patient has a contact number available for                            emergencies. The signs and symptoms of potential                            delayed complications were discussed with the                            patient. Return to normal activities tomorrow.                            Written discharge instructions were provided to the  patient.                           - High fiber diet.                           - Use FiberCon 1-2 tablets PO daily.                           -  Continue present medications.                           - Await pathology results.                           - Repeat colonoscopy in likely 7 years for                            surveillance due to history of prior adenomatous                            colon polyps even if pathology on today's                            colonoscopy is non-adenomatous.                           - The findings and recommendations were discussed                            with the patient. Justice Britain, MD 08/22/2020 9:08:58 AM

## 2020-08-22 NOTE — Patient Instructions (Signed)
Impression/Recommendations:  Polyp handout given to patient. Hemorrhoid handout given to patient. High fiber diet handout given to patient.  Use FiberCon 1-2 tablets by mouth daily. Continue presentmediccations. Await pathology results.  Repeat colonoscopy in 7 years for surveillance.   YOU HAD AN ENDOSCOPIC PROCEDURE TODAY AT [facility]:   Refer to the procedure report that was given to you for any specific questions about what was found during the examination.  If the procedure report does not answer your questions, please call your gastroenterologist to clarify.  If you requested that your care partner not be given the details of your procedure findings, then the procedure report has been included in a sealed envelope for you to review at your convenience later.  YOU SHOULD EXPECT: Some feelings of bloating in the abdomen. Passage of more gas than usual.  Walking can help get rid of the air that was put into your GI tract during the procedure and reduce the bloating. If you had a lower endoscopy (such as a colonoscopy or flexible sigmoidoscopy) you may notice spotting of blood in your stool or on the toilet paper. If you underwent a bowel prep for your procedure, you may not have a normal bowel movement for a few days.  Please Note:  You might notice some irritation and congestion in your nose or some drainage.  This is from the oxygen used during your procedure.  There is no need for concern and it should clear up in a day or so.  SYMPTOMS TO REPORT IMMEDIATELY:   Following lower endoscopy (colonoscopy or flexible sigmoidoscopy):  Excessive amounts of blood in the stool  Significant tenderness or worsening of abdominal pains  Swelling of the abdomen that is new, acute  Fever of 100F or higher  For urgent or emergent issues, a gastroenterologist can be reached at any hour by calling [phone]. Do not use MyChart messaging for urgent concerns.    DIET:  We do  recommend a small meal at first, but then you may proceed to your regular diet.  Drink plenty of fluids but you should avoid alcoholic beverages for 24 hours.  ACTIVITY:  You should plan to take it easy for the rest of today and you should NOT DRIVE or use heavy machinery until tomorrow (because of the sedation medicines used during the test).    FOLLOW UP: Our staff will call the number listed on your records 48-72 hours following your procedure to check on you and address any questions or concerns that you may have regarding the information given to you following your procedure. If we do not reach you, we will leave a message.  We will attempt to reach you two times.  During this call, we will ask if you have developed any symptoms of COVID 19. If you develop any symptoms (ie: fever, flu-like symptoms, shortness of breath, cough etc.) before then, please call [phone].  If you test positive for Covid 19 in the 2 weeks post procedure, please call and report this information to [place].    If any biopsies were taken you will be contacted by phone or by letter within the next 1-3 weeks.  Please call us at [phone] if you have not heard about the biopsies in 3 weeks.    SIGNATURES/CONFIDENTIALITY: You and/or your care partner have signed paperwork which will be entered into your electronic medical record.  These signatures attest to the fact that that the information above on your After Visit  Summary has been reviewed and is understood.  Full responsibility of the confidentiality of this discharge information lies with you and/or your care-partner.

## 2020-08-26 ENCOUNTER — Telehealth: Payer: Self-pay

## 2020-08-26 NOTE — Telephone Encounter (Signed)
  Follow up Call-  Call back number 08/22/2020  Post procedure Call Back phone  # [phone]  Permission to leave phone message Yes  Some recent data might be hidden     Patient questions:  Do you have a fever, pain , or abdominal swelling? No. Pain Score  0 *  Have you tolerated food without any problems? Yes.    Have you been able to return to your normal activities? Yes.    Do you have any questions about your discharge instructions: Diet   No. Medications  No. Follow up visit  No.  Do you have questions or concerns about your Care? No.  Actions: * If pain score is 4 or above: No action needed, pain <4. \ 1. Have you developed a fever since your procedure? no  2.   Have you had an respiratory symptoms (SOB or cough) since your procedure? no  3.   Have you tested positive for COVID 19 since your procedure no  4.   Have you had any family members/close contacts diagnosed with the COVID 19 since your procedure?  no   If yes to any of these questions please route to Joylene John, RN and Joella Prince, RN

## 2020-08-28 ENCOUNTER — Encounter: Payer: Self-pay | Admitting: Gastroenterology

## 2020-09-12 ENCOUNTER — Encounter: Payer: Self-pay | Admitting: Urology

## 2020-09-16 ENCOUNTER — Other Ambulatory Visit: Payer: Self-pay

## 2020-09-16 MED ORDER — SOLIFENACIN SUCCINATE 5 MG PO TABS
5.0000 mg | ORAL_TABLET | Freq: Every day | ORAL | 1 refills | Status: DC
Start: 2020-09-16 — End: 2020-11-05

## 2020-09-17 ENCOUNTER — Other Ambulatory Visit: Payer: Self-pay | Admitting: Family Medicine

## 2020-09-30 ENCOUNTER — Ambulatory Visit: Payer: Self-pay | Admitting: Podiatry

## 2020-10-10 ENCOUNTER — Ambulatory Visit: Payer: Medicare PPO | Admitting: Podiatry

## 2020-10-10 ENCOUNTER — Other Ambulatory Visit: Payer: Self-pay

## 2020-10-10 ENCOUNTER — Ambulatory Visit: Payer: Self-pay

## 2020-10-10 DIAGNOSIS — M6740 Ganglion, unspecified site: Secondary | ICD-10-CM

## 2020-10-10 DIAGNOSIS — M7752 Other enthesopathy of left foot: Secondary | ICD-10-CM

## 2020-10-10 DIAGNOSIS — M7742 Metatarsalgia, left foot: Secondary | ICD-10-CM | POA: Diagnosis not present

## 2020-10-11 ENCOUNTER — Ambulatory Visit: Payer: Medicare PPO

## 2020-10-16 NOTE — Progress Notes (Signed)
Subjective:   Patient ID: Alexis Wallace, female   DOB: 64 y.o.   MRN: [id number]   HPI 64 year old female presents the office today for concerns of a possible cyst Her left foot pointing submetatarsal 5.  She states that she noted some fluid discomfort in this area.  She denies any recent injury or trauma.  She denies any swelling or redness otherwise. She has had no recent treatment.  She has no other concerns today.   Review of Systems  Genitourinary: Urgency:    All other systems reviewed and are negative.  Past Medical History:  Diagnosis Date   Allergy    Anemia, iron deficiency    B12 deficiency    CAP (community acquired pneumonia) 11/18/2014   GERD (gastroesophageal reflux disease)    Heartburn    Iron deficiency    Osteoarthritis    Osteopenia    Solitary pulmonary nodule 12/05/2014   Dx 11/19/14  3 mm > f/u 11/27/2015 > resolved, no further studies rec     Tibial plateau fracture    june 2020- murphy/wainer cared for her   Urinary tract infection     Past Surgical History:  Procedure Laterality Date   carpal tunnel right     COLONOSCOPY     COSMETIC SURGERY     tummy tuck   GASTRIC BYPASS  2001   POLYPECTOMY       Current Outpatient Medications:    bimatoprost (LUMIGAN) 0.03 % ophthalmic solution, SMARTSIG:1 Drop(s) In Eye(s) Every Evening, Disp: , Rfl:    calcium carbonate 200 MG capsule, Take 3,000 mg by mouth daily., Disp: , Rfl:    cyanocobalamin (,VITAMIN B-12,) 1000 MCG/ML injection, INJECT 1ML (1000MCG TOTAL) INTO THE MUSCLE EVERY 30 DAYS., Disp: 3 mL, Rfl: 3   estradiol (ESTRACE) 2 MG tablet, Take 1 mg by mouth daily. , Disp: , Rfl:    fluticasone (FLONASE) 50 MCG/ACT nasal spray, USE 2 SPRAYS IN EACH NOSTRIL ONCE DAILY, Disp: 16 g, Rfl: 1   Meth-Hyo-M Bl-Na Phos-Ph Sal (URIBEL PO), Take by mouth. (Patient not taking: Reported on 08/16/2020), Disp: , Rfl:    Multiple Vitamin (MULTIVITAMIN) tablet, Take 1 tablet by mouth  daily., Disp: , Rfl:    omeprazole (PRILOSEC) 20 MG capsule, TAKE ONE (1) CAPSULE EACH DAY, Disp: 90 capsule, Rfl: 3   OVER THE COUNTER MEDICATION, Vitamin D 3 one capsule daily., Disp: , Rfl:    progesterone (PROMETRIUM) 100 MG capsule, Take 1 capsule by mouth daily., Disp: , Rfl:    solifenacin (VESICARE) 5 MG tablet, Take 1 tablet (5 mg total) by mouth daily., Disp: 30 tablet, Rfl: 1   temazepam (RESTORIL) 15 MG capsule, TAKE 1 CAPSULE (15 MG TOTAL) BY MOUTH ATBEDTIME AS NEEDED FOR SLEEP, Disp: 30 capsule, Rfl: 3   valACYclovir (VALTREX) 1000 MG tablet, Take 2 pills twice a day for 1 day at first sign of cold sore, Disp: 30 tablet, Rfl: 1  No Known Allergies       Objective:  Physical Exam  General: AAO x3, NAD  Dermatological: There is minimal hyperkeratotic tissue left foot submetatarsal 5.  There is no ongoing ulceration drainage or signs of infection.  Vascular: Dorsalis Pedis artery and Posterior Tibial artery pedal pulses are 2/4 bilateral with immedate capillary fill time.There is no pain with calf compression, swelling, warmth, erythema.   Neruologic: Grossly intact via light touch bilateral.  Musculoskeletal: Prominence of metatarsal head plantarly with atrophy of the fat pad resulting in prominent on  the fifth metatarsal head.  Thickness.  Small bursa, fluid present this area but no significant pain today.  There is no erythema warmth.  No other areas of discomfort identified today.  Muscular strength 5/5 in all groups tested bilateral.  Gait: Unassisted, Nonantalgic.       Assessment:   64 year old female with left submetatarsal 5 bursitis    Plan:  -Treatment options discussed including all alternatives, risks, and complications -Etiology of symptoms were discussed -X-rays were obtained and reviewed with the patient.  There is no evidence of acute fracture or stress fracture identified today. -Discussed steroid injection we held off on this.  Dispensed  offloading pads.  Discussed shoe modifications as well.    Trula Slade DPM

## 2020-10-17 ENCOUNTER — Ambulatory Visit: Payer: Medicare PPO

## 2020-10-24 ENCOUNTER — Ambulatory Visit: Payer: Medicare PPO

## 2020-10-24 ENCOUNTER — Ambulatory Visit (INDEPENDENT_AMBULATORY_CARE_PROVIDER_SITE_OTHER): Payer: Medicare PPO

## 2020-10-24 DIAGNOSIS — Z0000 Encounter for general adult medical examination without abnormal findings: Secondary | ICD-10-CM | POA: Diagnosis not present

## 2020-10-24 NOTE — Patient Instructions (Signed)
Alexis Wallace , Thank you for taking time to come for your Medicare Wellness Visit. I appreciate your ongoing commitment to your health goals. Please review the following plan we discussed and let me know if I can assist you in the future.   Screening recommendations/referrals: Colonoscopy: Done 08/22/20 Mammogram: Done 01/12/19 Bone Density: Done 11/13/15 Recommended yearly ophthalmology/optometry visit for glaucoma screening and checkup Recommended yearly dental visit for hygiene and checkup  Vaccinations: Influenza vaccine: Done 09/09/20 Up to date Pneumococcal vaccine: Up to date Tdap vaccine: Up to date Shingles vaccine: Completed 09/25/19   Covid-19:Completed 1/19, 2/9, & 07/31/20  Advanced directives: Please bring a copy of your health care power of attorney and living will to the office at your convenience.  Conditions/risks identified: Lose weight  Next appointment: Follow up in one year for your annual wellness visit    Preventive Care 65 Years and Older, Female Preventive care refers to lifestyle choices and visits with your health care provider that can promote health and wellness. What does preventive care include?  A yearly physical exam. This is also called an annual well check.  Dental exams once or twice a year.  Routine eye exams. Ask your health care provider how often you should have your eyes checked.  Personal lifestyle choices, including:  Daily care of your teeth and gums.  Regular physical activity.  Eating a healthy diet.  Avoiding tobacco and drug use.  Limiting alcohol use.  Practicing safe sex.  Taking low-dose aspirin every day.  Taking vitamin and mineral supplements as recommended by your health care provider. What happens during an annual well check? The services and screenings done by your health care provider during your annual well check will depend on your age, overall health, lifestyle risk factors, and family history of  disease. Counseling  Your health care provider may ask you questions about your:  Alcohol use.  Tobacco use.  Drug use.  Emotional well-being.  Home and relationship well-being.  Sexual activity.  Eating habits.  History of falls.  Memory and ability to understand (cognition).  Work and work Statistician.  Reproductive health. Screening  You may have the following tests or measurements:  Height, weight, and BMI.  Blood pressure.  Lipid and cholesterol levels. These may be checked every 5 years, or more frequently if you are over 60 years old.  Skin check.  Lung cancer screening. You may have this screening every year starting at age 13 if you have a 30-pack-year history of smoking and currently smoke or have quit within the past 15 years.  Fecal occult blood test (FOBT) of the stool. You may have this test every year starting at age 44.  Flexible sigmoidoscopy or colonoscopy. You may have a sigmoidoscopy every 5 years or a colonoscopy every 10 years starting at age 60.  Hepatitis C blood test.  Hepatitis B blood test.  Sexually transmitted disease (STD) testing.  Diabetes screening. This is done by checking your blood sugar (glucose) after you have not eaten for a while (fasting). You may have this done every 1-3 years.  Bone density scan. This is done to screen for osteoporosis. You may have this done starting at age 56.  Mammogram. This may be done every 1-2 years. Talk to your health care provider about how often you should have regular mammograms. Talk with your health care provider about your test results, treatment options, and if necessary, the need for more tests. Vaccines  Your health care provider may recommend  certain vaccines, such as:  Influenza vaccine. This is recommended every year.  Tetanus, diphtheria, and acellular pertussis (Tdap, Td) vaccine. You may need a Td booster every 10 years.  Zoster vaccine. You may need this after age  52.  Pneumococcal 13-valent conjugate (PCV13) vaccine. One dose is recommended after age 28.  Pneumococcal polysaccharide (PPSV23) vaccine. One dose is recommended after age 13. Talk to your health care provider about which screenings and vaccines you need and how often you need them. This information is not intended to replace advice given to you by your health care provider. Make sure you discuss any questions you have with your health care provider. Document Released: 12/20/2015 Document Revised: 08/12/2016 Document Reviewed: 09/24/2015 Elsevier Interactive Patient Education  2017 [place] Prevention in the Home Falls can cause injuries. They can happen to people of all ages. There are many things you can do to make your home safe and to help prevent falls. What can I do on the outside of my home?  Regularly fix the edges of walkways and driveways and fix any cracks.  Remove anything that might make you trip as you walk through a door, such as a raised step or threshold.  Trim any bushes or trees on the path to your home.  Use bright outdoor lighting.  Clear any walking paths of anything that might make someone trip, such as rocks or tools.  Regularly check to see if handrails are loose or broken. Make sure that both sides of any steps have handrails.  Any raised decks and porches should have guardrails on the edges.  Have any leaves, snow, or ice cleared regularly.  Use sand or salt on walking paths during winter.  Clean up any spills in your garage right away. This includes oil or grease spills. What can I do in the bathroom?  Use night lights.  Install grab bars by the toilet and in the tub and shower. Do not use towel bars as grab bars.  Use non-skid mats or decals in the tub or shower.  If you need to sit down in the shower, use a plastic, non-slip stool.  Keep the floor dry. Clean up any water that spills on the floor as soon as it happens.  Remove  soap buildup in the tub or shower regularly.  Attach bath mats securely with double-sided non-slip rug tape.  Do not have throw rugs and other things on the floor that can make you trip. What can I do in the bedroom?  Use night lights.  Make sure that you have a light by your bed that is easy to reach.  Do not use any sheets or blankets that are too big for your bed. They should not hang down onto the floor.  Have a firm chair that has side arms. You can use this for support while you get dressed.  Do not have throw rugs and other things on the floor that can make you trip. What can I do in the kitchen?  Clean up any spills right away.  Avoid walking on wet floors.  Keep items that you use a lot in easy-to-reach places.  If you need to reach something above you, use a strong step stool that has a grab bar.  Keep electrical cords out of the way.  Do not use floor polish or wax that makes floors slippery. If you must use wax, use non-skid floor wax.  Do not have throw rugs and other  things on the floor that can make you trip. What can I do with my stairs?  Do not leave any items on the stairs.  Make sure that there are handrails on both sides of the stairs and use them. Fix handrails that are broken or loose. Make sure that handrails are as long as the stairways.  Check any carpeting to make sure that it is firmly attached to the stairs. Fix any carpet that is loose or worn.  Avoid having throw rugs at the top or bottom of the stairs. If you do have throw rugs, attach them to the floor with carpet tape.  Make sure that you have a light switch at the top of the stairs and the bottom of the stairs. If you do not have them, ask someone to add them for you. What else can I do to help prevent falls?  Wear shoes that:  Do not have high heels.  Have rubber bottoms.  Are comfortable and fit you well.  Are closed at the toe. Do not wear sandals.  If you use a  stepladder:  Make sure that it is fully opened. Do not climb a closed stepladder.  Make sure that both sides of the stepladder are locked into place.  Ask someone to hold it for you, if possible.  Clearly mark and make sure that you can see:  Any grab bars or handrails.  First and last steps.  Where the edge of each step is.  Use tools that help you move around (mobility aids) if they are needed. These include:  Canes.  Walkers.  Scooters.  Crutches.  Turn on the lights when you go into a dark area. Replace any light bulbs as soon as they burn out.  Set up your furniture so you have a clear path. Avoid moving your furniture around.  If any of your floors are uneven, fix them.  If there are any pets around you, be aware of where they are.  Review your medicines with your doctor. Some medicines can make you feel dizzy. This can increase your chance of falling. Ask your doctor what other things that you can do to help prevent falls. This information is not intended to replace advice given to you by your health care provider. Make sure you discuss any questions you have with your health care provider. Document Released: 09/19/2009 Document Revised: 04/30/2016 Document Reviewed: 12/28/2014 Elsevier Interactive Patient Education  2017 [employer].

## 2020-10-24 NOTE — Progress Notes (Signed)
Virtual Visit via Telephone Note  I connected with  Alexis Wallace on 10/24/20 at  8:45 AM EST by telephone and verified that I am speaking with the correct person using two identifiers.  Medicare Annual Wellness visit completed telephonically due to Covid-19 pandemic.   Persons participating in this call: This Health Coach and this patient.   Location: Patient: Home Provider: Office   I discussed the limitations, risks, security and privacy concerns of performing an evaluation and management service by telephone and the availability of in person appointments. The patient expressed understanding and agreed to proceed.  Unable to perform video visit due to video visit attempted and failed and/or patient does not have video capability.   Some vital signs may be absent or patient reported.   Willette Brace, LPN    Subjective:   Alexis Wallace is a 64 y.o. female who presents for an Initial Medicare Annual Wellness Visit.  Review of Systems     Cardiac Risk Factors include: advanced age (>53men, >18 women);dyslipidemia;obesity (BMI >30kg/m2)     Objective:    There were no vitals filed for this visit. There is no height or weight on file to calculate BMI.  Advanced Directives 10/24/2020 10/02/2016 09/15/2016 09/13/2016  Does Patient Have a Medical Advance Directive? Yes Yes Yes No  Type of Advance Directive Living will;Healthcare Power of [place];Living will [place];Living will -  Copy of [place] in Chart? No - copy requested No - copy requested - -    Current Medications (verified) Outpatient Encounter Medications as of 10/24/2020  Medication Sig  . bimatoprost (LUMIGAN) 0.03 % ophthalmic solution SMARTSIG:1 Drop(s) In Eye(s) Every Evening  . calcium carbonate 200 MG capsule Take 3,000 mg by mouth daily.  . cyanocobalamin (,VITAMIN B-12,) 1000 MCG/ML injection INJECT 1ML (1000MCG TOTAL) INTO  THE MUSCLE EVERY 30 DAYS.  . fluticasone (FLONASE) 50 MCG/ACT nasal spray USE 2 SPRAYS IN EACH NOSTRIL ONCE DAILY  . Multiple Vitamin (MULTIVITAMIN) tablet Take 1 tablet by mouth daily.  Marland Kitchen omeprazole (PRILOSEC) 20 MG capsule TAKE ONE (1) CAPSULE EACH DAY  . OVER THE COUNTER MEDICATION Vitamin D 3 one capsule daily.  . solifenacin (VESICARE) 5 MG tablet Take 1 tablet (5 mg total) by mouth daily.  . temazepam (RESTORIL) 15 MG capsule TAKE 1 CAPSULE (15 MG TOTAL) BY MOUTH ATBEDTIME AS NEEDED FOR SLEEP  . valACYclovir (VALTREX) 1000 MG tablet Take 2 pills twice a day for 1 day at first sign of cold sore  . minocycline (MINOCIN) 50 MG capsule Take 50 mg by mouth every 12 (twelve) hours.  . [DISCONTINUED] estradiol (ESTRACE) 2 MG tablet Take 1 mg by mouth daily.   . [DISCONTINUED] Meth-Hyo-M Bl-Na Phos-Ph Sal (URIBEL PO) Take by mouth. (Patient not taking: Reported on 08/16/2020)  . [DISCONTINUED] progesterone (PROMETRIUM) 100 MG capsule Take 1 capsule by mouth daily.   No facility-administered encounter medications on file as of 10/24/2020.    Allergies (verified) Patient has no known allergies.   History: Past Medical History:  Diagnosis Date  . Allergy   . Anemia, iron deficiency   . B12 deficiency   . CAP (community acquired pneumonia) 11/18/2014  . GERD (gastroesophageal reflux disease)   . Heartburn   . Iron deficiency   . Osteoarthritis   . Osteopenia   . Solitary pulmonary nodule 12/05/2014   Dx 11/19/14  3 mm > f/u 11/27/2015 > resolved, no further studies rec    .  Tibial plateau fracture    june 2020- murphy/wainer cared for her  . Urinary tract infection    Past Surgical History:  Procedure Laterality Date  . carpal tunnel right    . COLONOSCOPY    . COSMETIC SURGERY     tummy tuck  . GASTRIC BYPASS  2001  . POLYPECTOMY     Family History  Problem Relation Age of Onset  . Heart disease Father   . Lung cancer Mother        stage IV, smoker  . Heart disease Sister         Hypertrophic cardiomyopathy  . Uterine cancer Sister   . Colon cancer Neg Hx   . Esophageal cancer Neg Hx   . Rectal cancer Neg Hx   . Stomach cancer Neg Hx   . Colon polyps Neg Hx    Social History   Socioeconomic History  . Marital status: Married    Spouse name: Not on file  . Number of children: 2  . Years of education: Not on file  . Highest education level: Not on file  Occupational History  . Not on file  Tobacco Use  . Smoking status: Former Smoker    Packs/day: 1.00    Years: 5.00    Pack years: 5.00    Types: Cigarettes    Quit date: 02/10/1978    Years since quitting: 42.7  . Smokeless tobacco: Never Used  Substance and Sexual Activity  . Alcohol use: No  . Drug use: No  . Sexual activity: Yes  Other Topics Concern  . Not on file  Social History Narrative   Married to [employer]. 2 kids. Jenny Reichmann (his wife had TBI being struck by car) with daughter [name]. Patient takes care of [name] and son lives with her and husband) and Colletta Maryland lives in [place] with no kids.       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  Raising granddaughter after daughter has TBI.       Hobbies: spending time with friends and family          Social Determinants of Health   Financial Resource Strain: Low Risk   . Difficulty of Paying Living Expenses: Not hard at all  Food Insecurity: No Food Insecurity  . Worried About Charity fundraiser in the Last Year: Never true  . Ran Out of Food in the Last Year: Never true  Transportation Needs: No Transportation Needs  . Lack of Transportation (Medical): No  . Lack of Transportation (Non-Medical): No  Physical Activity: Insufficiently Active  . Days of Exercise per Week: 3 days  . Minutes of Exercise per Session: 30 min  Stress: No Stress Concern Present  . Feeling of Stress : Not at all  Social Connections: Moderately Integrated  .  Frequency of Communication with Friends and Family: More than three times a week  . Frequency of Social Gatherings with Friends and Family: More than three times a week  . Attends Religious Services: Never  . Active Member of Clubs or Organizations: Yes  . Attends Archivist Meetings: 1 to 4 times per year  . Marital Status: Married    Tobacco Counseling Counseling given: Not Answered   Clinical Intake:  Pre-visit preparation completed: Yes  Pain : No/denies pain     BMI - recorded: 33.91 Nutritional Status: BMI > 30  Obese Nutritional  Risks: None Diabetes: No  How often do you need to have someone help you when you read instructions, pamphlets, or other written materials from your doctor or pharmacy?: 1 - Never  Diabetic?no  Interpreter Needed?: No  Information entered by :: Charlott Rakes, LPN   Activities of Daily Living In your present state of health, do you have any difficulty performing the following activities: 10/24/2020  Hearing? N  Vision? N  Difficulty concentrating or making decisions? N  Walking or climbing stairs? N  Dressing or bathing? N  Doing errands, shopping? N  Preparing Food and eating ? N  Using the Toilet? N  In the past six months, have you accidently leaked urine? Y  Comment issues with urinary incontinece has cleared up  Do you have problems with loss of bowel control? N  Managing your Medications? N  Managing your Finances? N  Housekeeping or managing your Housekeeping? N  Some recent data might be hidden    Patient Care Team: Marin Olp, MD as PCP - General (Family Medicine)  Indicate any recent Medical Services you may have received from other than [facility] providers in the past year (date may be approximate).     Assessment:   This is a routine wellness examination for Alexis Wallace.  Hearing/Vision screen  Hearing Screening   125Hz  250Hz  500Hz  1000Hz  2000Hz  3000Hz  4000Hz  6000Hz  8000Hz   Right ear:           Left  ear:           Comments: Pt denies any difficulty hearing   Vision Screening Comments: Follows up with Dr Valetta Close for annual eye exams  Dietary issues and exercise activities discussed:    Goals    . Patient Stated     Lose weight       Depression Screen PHQ 2/9 Scores 10/24/2020 04/26/2020 08/04/2019 12/13/2018 06/01/2018  PHQ - 2 Score 0 0 0 0 0  PHQ- 9 Score - 0 - - -    Fall Risk Fall Risk  10/24/2020 07/19/2020 08/04/2019  Falls in the past year? 0 0 1  Number falls in past yr: 0 0 0  Injury with Fall? 0 0 1  Risk for fall due to : Impaired vision - -  Follow up Falls prevention discussed - -    Any stairs in or around the home? Yes  If so, are there any without handrails? No  Home free of loose throw rugs in walkways, pet beds, electrical cords, etc? Yes  Adequate lighting in your home to reduce risk of falls? Yes   ASSISTIVE DEVICES UTILIZED TO PREVENT FALLS:  Life alert? No  Use of a cane, walker or w/c? No  Grab bars in the bathroom? No  Shower chair or bench in shower? No  Elevated toilet seat or a handicapped toilet? Yes   TIMED UP AND GO:  Was the test performed? No .      Cognitive Function: 6CIT declined at this time        Immunizations Immunization History  Administered Date(s) Administered  . Fluad Quad(high Dose 65+) 08/04/2019, 09/09/2020  . Influenza Split 10/03/2012  . Influenza Whole 09/06/2001, 10/12/2007, 09/25/2008, 09/01/2010  . Influenza,inj,Quad PF,6+ Mos 08/28/2013, 08/30/2014  . Influenza-Unspecified 09/21/2015, 08/07/2016, 08/31/2018  . PFIZER SARS-COV-2 Vaccination 12/26/2019, 01/16/2020, 07/31/2020  . Pneumococcal Polysaccharide-23 08/04/2019  . Td 06/07/2003  . Tdap 12/10/2015  . Zoster Recombinat (Shingrix) 09/25/2019    TDAP status: Up to date   Flu Vaccine status:  Up to date Done 09/09/20  Pneumococcal vaccine status: Up to date Covid-19 vaccine status: Completed vaccines  Qualifies for Shingles Vaccine? Yes     Zostavax completed Yes   Shingrix Completed?: Yes  Screening Tests Health Maintenance  Topic Date Due  . MAMMOGRAM  01/12/2021  . TETANUS/TDAP  12/09/2025  . COLONOSCOPY  08/23/2027  . INFLUENZA VACCINE  Completed  . DEXA SCAN  Completed  . COVID-19 Vaccine  Completed  . Hepatitis C Screening  Completed  . PNA vac Low Risk Adult  Discontinued    Health Maintenance  There are no preventive care reminders to display for this patient.  Colorectal cancer screening: Completed 08/22/20. Repeat every 7 years Mammogram status: Completed 01/12/19. Repeat every year Bone Density status: Completed 11/13/15. Results reflect: Bone density results: OSTEOPENIA. Repeat every 2 years.    Additional Screening:  Hepatitis C Screening:  Completed 12/10/15  Vision Screening: Recommended annual ophthalmology exams for early detection of glaucoma and other disorders of the eye. Is the patient up to date with their annual eye exam?  Yes  Who is the provider or what is the name of the office in which the patient attends annual eye exams? Dr Valetta Close   Dental Screening: Recommended annual dental exams for proper oral hygiene  Community Resource Referral / Chronic Care Management: CRR required this visit?  No   CCM required this visit?  No      Plan:     I have personally reviewed and noted the following in the patient's chart:   . Medical and social history . Use of alcohol, tobacco or illicit drugs  . Current medications and supplements . Functional ability and status . Nutritional status . Physical activity . Advanced directives . List of other physicians . Hospitalizations, surgeries, and ER visits in previous 12 months . Vitals . Screenings to include cognitive, depression, and falls . Referrals and appointments  In addition, I have reviewed and discussed with patient certain preventive protocols, quality metrics, and best practice recommendations. A written personalized care plan  for preventive services as well as general preventive health recommendations were provided to patient.     Willette Brace, LPN   [id number]   Nurse Notes: None

## 2020-11-04 ENCOUNTER — Encounter: Payer: Self-pay | Admitting: Urology

## 2020-11-05 ENCOUNTER — Other Ambulatory Visit: Payer: Self-pay

## 2020-11-05 DIAGNOSIS — N390 Urinary tract infection, site not specified: Secondary | ICD-10-CM

## 2020-11-05 MED ORDER — SOLIFENACIN SUCCINATE 5 MG PO TABS: 5.0000 mg | ORAL_TABLET | Freq: Every day | ORAL | 3 refills | Status: DC

## 2020-11-18 ENCOUNTER — Telehealth: Payer: Self-pay

## 2020-11-18 NOTE — Telephone Encounter (Signed)
Please schedule pt for next Monday at 2:20 for post covid complications. Ok per [employer]

## 2020-11-18 NOTE — Telephone Encounter (Signed)
Patient has been scheduled

## 2020-11-25 ENCOUNTER — Encounter: Payer: Self-pay | Admitting: Family Medicine

## 2020-11-25 ENCOUNTER — Ambulatory Visit (INDEPENDENT_AMBULATORY_CARE_PROVIDER_SITE_OTHER): Payer: Medicare PPO | Admitting: Family Medicine

## 2020-11-25 ENCOUNTER — Other Ambulatory Visit: Payer: Self-pay

## 2020-11-25 VITALS — BP 147/82 | HR 63 | Temp 96.4°F | Resp 18 | Ht 66.0 in | Wt 214.8 lb

## 2020-11-25 DIAGNOSIS — E559 Vitamin D deficiency, unspecified: Secondary | ICD-10-CM | POA: Diagnosis not present

## 2020-11-25 DIAGNOSIS — R5383 Other fatigue: Secondary | ICD-10-CM | POA: Diagnosis not present

## 2020-11-25 DIAGNOSIS — D518 Other vitamin B12 deficiency anemias: Secondary | ICD-10-CM

## 2020-11-25 NOTE — Patient Instructions (Addendum)
Your blood pressure trend concerns me today. I would like for you to buy/use a home cuff to check at least 4x a week. Your goal is <135/85.  Update me within a week how #s are doing.  - let us know how things are going could consider wellbutrin if blood pressure normal at home and you can monitor while on medicine A-frame If blood pressure is high at home- would be worth trying off veiscare and seeing how blood pressure does- keep me updated  If blood pressure does not improve- Would be reasonable to Get Dr. Starleen Arms opinion- not sure we have any of our current therapists doing ADD evals. Please let them know about BP if still running high  Please stop by lab before you go If you have mychart- we will send your results within 3 business days of [place] receiving them.  If you do not have mychart- we will call you about results within 5 business days of [place] receiving them.  *please note we are currently using [facility] which has a longer processing time than [facility] typically so labs may not come back as quickly as in the past *please also note that you will see labs on mychart as soon as they post. I will later go in and write notes on them- will say "notes from Dr. Yong Channel"  Recommended follow up: Return in about 6 months (around 05/26/2021) for physical or sooner if needed.

## 2020-11-25 NOTE — Progress Notes (Signed)
Phone [phone] In person visit   Subjective:   Alexis Wallace is a 64 y.o. year old very pleasant female patient who presents for/with See problem oriented charting Chief Complaint  Patient presents with  . Unable to focus  . Overreating   This visit occurred during the SARS-CoV-2 public health emergency.  Safety protocols were in place, including screening questions prior to the visit, additional usage of staff PPE, and extensive cleaning of exam room while observing appropriate contact time as indicated for disinfecting solutions.   Past Medical History-  Patient Active Problem List   Diagnosis Date Noted  . Recurrent UTI 04/07/2016    Priority: High  . Anxiety 12/13/2018    Priority: Medium  . Hyperlipidemia 05/20/2017    Priority: Medium  . History of adenomatous polyp of colon 10/12/2016    Priority: Medium  . Rosacea 08/30/2014    Priority: Medium  . Insomnia 08/30/2014    Priority: Medium  . Bariatric surgery status 09/01/2010    Priority: Medium  . 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 ANEMIA, B12 DEFICIENCY 08/15/2007    Priority: Medium  . Plantar fasciitis, right 12/13/2014    Priority: Low  . Solitary pulmonary nodule 12/05/2014    Priority: Low  . CAP (community acquired pneumonia) 11/18/2014    Priority: Low  . Family history of hypertrophic cardiomyopathy 05/13/2011    Priority: Low  . Actinic keratosis 10/22/2010    Priority: Low  . 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 ALLERGIC RHINITIS 03/01/2008    Priority: Low  . ANEMIA-IRON DEFICIENCY 11/21/2007    Priority: Low  . GERD 06/02/2007    Priority: Low  . PSORIASIS 06/02/2007    Priority: Low  . OSTEOARTHRITIS 06/02/2007    Priority: Low  . OSTEOPENIA 06/02/2007    Priority: Low    Medications- reviewed and updated Current Outpatient Medications  Medication Sig Dispense Refill  . calcium carbonate 200 MG capsule Take  3,000 mg by mouth daily.    . cyanocobalamin (,VITAMIN B-12,) 1000 MCG/ML injection INJECT 1ML (1000MCG TOTAL) INTO THE MUSCLE EVERY 30 DAYS. 3 mL 3  . fluticasone (FLONASE) 50 MCG/ACT nasal spray USE 2 SPRAYS IN EACH NOSTRIL ONCE DAILY 16 g 1  . Multiple Vitamin (MULTIVITAMIN) tablet Take 1 tablet by mouth daily.    Marland Kitchen omeprazole (PRILOSEC) 20 MG capsule TAKE ONE (1) CAPSULE EACH DAY 90 capsule 3  . OVER THE COUNTER MEDICATION Vitamin D 3 one capsule daily.    . solifenacin (VESICARE) 5 MG tablet Take 1 tablet (5 mg total) by mouth daily. 30 tablet 3  . temazepam (RESTORIL) 15 MG capsule TAKE 1 CAPSULE (15 MG TOTAL) BY MOUTH ATBEDTIME AS NEEDED FOR SLEEP 30 capsule 3  . valACYclovir (VALTREX) 1000 MG tablet Take 2 pills twice a day for 1 day at first sign of cold sore 30 tablet 1  . bimatoprost (LUMIGAN) 0.03 % ophthalmic solution SMARTSIG:1 Drop(s) In Eye(s) Every Evening    . minocycline (MINOCIN) 50 MG capsule Take 50 mg by mouth every 12 (twelve) hours.     No current facility-administered medications for this visit.     Objective:  BP (!) 147/82   Pulse 63   Temp (!) 96.4 F (35.8 C) (Temporal)   Resp 18   Ht 5\' 6"  (1.676 m)   Wt 214 lb 12.8 oz (97.4 kg)   SpO2 100%   BMI 34.67  kg/m  Gen: NAD, resting comfortably CV: RRR no murmurs rubs or gallops Lungs: CTAB no crackles, wheeze, rhonchi Abdomen: soft/nontender/nondistended/normal bowel sounds. Ext: no edema Skin: warm, dry     Assessment and Plan   # Unable to focus # Overeating #some fatigue S:She noticed that she has been overeating since she reduce her hormone therapy- titrated off completely in September.    Patient stated that since she had a covid back in August she is unable to focus at work or when she is doing day to day things- in reflection really started in September (originally thought it was post covid related but she is less confident now and thinks started later). Mother and son and daughter had ADD.  Weight up slightly. Feels like had lifelong issues with focus but now cant control - feels like when inattentive also seems to be hungry- they tend to drive each other  Feels more fatigued as well and sleeping more. Denies depressed mood. Some anxiety.  A/P: 64 year old female with several issues starting around September including inability to focus, overeating, fatigue.  Had Covid in August and initially thought this may be post Covid but in further reflection seem to start later than that seemingly correspond to stopping hormone replacement.  We are going to update some basic blood work to see if there is an obvious cause.  I strongly consider prescribing Wellbutrin but this could raise blood pressure-I will make sure her blood pressure is better controlled before considering this.  We also discussed possibly seeing her sons Dr.-Psychiatrist Dr. Toy Care for expert opinion-medication like Vyvanse may be helpful for her symptoms but at her age with blood pressure high I would certainly be hesitant to try this without a formal diagnosis of ADD  #elevated BP readings S: medication: none Home readings #s: Has a cough but has not been checking BP Readings from Last 3 Encounters:  11/25/20 (!) 147/82  08/22/20 130/74  08/16/20 120/73  A/P: Patient's blood pressure is traditionally well controlled- was high on 3 checks today and worsened each check.  She did recently start Vesicare which has a 1% chance of causing hypertension per up to date. May trial off that if BP is high at home  # B12 deficiency S: Current treatment/medication (oral vs. IM):  1037mcg injections once a month Lab Results  Component Value Date   VITAMINB12 168 (L) 08/04/2019  A/P: Hopefully well controlled-update B12 with labs today.  Continue monthly injections  #Vitamin D deficiency S: Medication: thinks taking 1000 units of vitamin D Last vitamin D Lab Results  Component Value Date   VD25OH 27.52 (L) 08/04/2019  A/P: prior  deficiency and with above concerns- update levels today.  Continue least 1000 units of vitamin D per day   Depression screen [facility] 2/9 10/24/2020 04/26/2020 08/04/2019  Decreased Interest 0 0 0  Down, Depressed, Hopeless 0 0 0  PHQ - 2 Score 0 0 0  Altered sleeping - 0 -  Tired, decreased energy - 0 -  Change in appetite - 0 -  Feeling bad or failure about yourself  - 0 -  Trouble concentrating - 0 -  Moving slowly or fidgety/restless - 0 -  Suicidal thoughts - 0 -  PHQ-9 Score - 0 -  Difficult doing work/chores - Not difficult at all -   Recommended follow up: Return in about 6 months (around 05/26/2021) for physical or sooner if needed. Future Appointments  Date Time Provider [place]  12/13/2020 11:00 AM Wannetta Sender,  Governor Rooks, MD [facility]  02/17/2021  8:30 AM McKenzie, Candee Furbish, MD [facility] None    Lab/Order associations:   ICD-10-CM   1. Fatigue, unspecified type  R53.83 CBC With Differential/Platelet    COMPLETE METABOLIC PANEL WITH GFR    TSH    Vitamin B12    Vitamin B12    TSH    COMPLETE METABOLIC PANEL WITH GFR    CBC With Differential/Platelet  2. ANEMIA, B12 DEFICIENCY  D51.8 Vitamin B12    Vitamin B12  3. Vitamin D deficiency  E55.9 VITAMIN D 25 Hydroxy (Vit-D Deficiency, Fractures)    VITAMIN D 25 Hydroxy (Vit-D Deficiency, Fractures)    Time Spent: 28 minutes of total time (2:33 PM- 3:01 PM) was spent on the date of the encounter performing the following actions: chart review prior to seeing the patient, obtaining history, performing a medically necessary exam, counseling on the treatment plan, placing orders, and documenting in our EHR.   Return precautions advised.  [name], MD

## 2020-11-26 ENCOUNTER — Other Ambulatory Visit: Payer: Self-pay | Admitting: Family Medicine

## 2020-11-26 LAB — EXTRA LAV TOP TUBE

## 2020-11-26 LAB — VITAMIN D 25 HYDROXY (VIT D DEFICIENCY, FRACTURES): Vit D, 25-Hydroxy: 27 ng/mL — ABNORMAL LOW (ref 30–100)

## 2020-11-26 MED ORDER — VITAMIN D (ERGOCALCIFEROL) 1.25 MG (50000 UNIT) PO CAPS: 50000.0000 [IU] | ORAL_CAPSULE | ORAL | 1 refills | Status: DC

## 2020-12-01 ENCOUNTER — Encounter: Payer: Self-pay | Admitting: Family Medicine

## 2020-12-08 ENCOUNTER — Encounter: Payer: Self-pay | Admitting: Family Medicine

## 2020-12-09 ENCOUNTER — Other Ambulatory Visit: Payer: Self-pay

## 2020-12-09 ENCOUNTER — Other Ambulatory Visit (INDEPENDENT_AMBULATORY_CARE_PROVIDER_SITE_OTHER): Payer: Medicare PPO

## 2020-12-09 DIAGNOSIS — R5383 Other fatigue: Secondary | ICD-10-CM

## 2020-12-09 LAB — CBC WITH DIFFERENTIAL/PLATELET
Basophils Absolute: 0.1 10*3/uL (ref 0.0–0.1)
Basophils Relative: 1.7 % (ref 0.0–3.0)
Eosinophils Absolute: 0.4 10*3/uL (ref 0.0–0.7)
Eosinophils Relative: 7.9 % — ABNORMAL HIGH (ref 0.0–5.0)
HCT: 36.1 % (ref 36.0–46.0)
Hemoglobin: 12.2 g/dL (ref 12.0–15.0)
Lymphocytes Relative: 32.1 % (ref 12.0–46.0)
Lymphs Abs: 1.5 10*3/uL (ref 0.7–4.0)
MCHC: 33.7 g/dL (ref 30.0–36.0)
MCV: 90.2 fl (ref 78.0–100.0)
Monocytes Absolute: 0.3 10*3/uL (ref 0.1–1.0)
Monocytes Relative: 6.4 % (ref 3.0–12.0)
Neutro Abs: 2.3 10*3/uL (ref 1.4–7.7)
Neutrophils Relative %: 51.9 % (ref 43.0–77.0)
Platelets: 215 10*3/uL (ref 150.0–400.0)
RBC: 4 Mil/uL (ref 3.87–5.11)
RDW: 12.9 % (ref 11.5–15.5)
WBC: 4.5 10*3/uL (ref 4.0–10.5)

## 2020-12-09 LAB — COMPREHENSIVE METABOLIC PANEL
ALT: 28 U/L (ref 0–35)
AST: 27 U/L (ref 0–37)
Albumin: 4 g/dL (ref 3.5–5.2)
Alkaline Phosphatase: 78 U/L (ref 39–117)
BUN: 13 mg/dL (ref 6–23)
CO2: 31 mEq/L (ref 19–32)
Calcium: 9.3 mg/dL (ref 8.4–10.5)
Chloride: 102 mEq/L (ref 96–112)
Creatinine, Ser: 0.66 mg/dL (ref 0.40–1.20)
GFR: 91.08 mL/min (ref >60.00)
Glucose, Bld: 94 mg/dL (ref 70–99)
Potassium: 3.9 mEq/L (ref 3.5–5.1)
Sodium: 140 mEq/L (ref 135–145)
Total Bilirubin: 0.6 mg/dL (ref 0.2–1.2)
Total Protein: 6.2 g/dL (ref 6.0–8.3)

## 2020-12-09 LAB — VITAMIN B12: Vitamin B-12: 430 pg/mL (ref 211–911)

## 2020-12-09 LAB — TSH: TSH: 3.11 u[IU]/mL (ref 0.35–4.50)

## 2020-12-09 NOTE — Progress Notes (Signed)
[facility] New Patient Evaluation and Consultation  Referring Provider: Marin Olp, MD PCP: Marin Olp, MD Date of Service: 12/13/2020  SUBJECTIVE Chief Complaint: New Patient (Initial Visit) (Frequent utis and urinary urgency)  History of Present Illness: Alexis Wallace is a 65 y.o. White or Caucasian female presenting for evaluation of recurrent UTI.    Review of records in [place] significant for:  Urine cultures:  08/05/20:  Mixed flora 07/10/20: > 100,000 Klebsiella pneumoniae, treated with keflex (resistant to ampicillin and nitrofurantoin) 04/26/20: >100,000 Citrobacter freundii, treated with Macrobid  Has seen Dr Alyson Ingles at [facility]. Was given a sample of Myrbetriq 25mg  and Vesicare.   Previously seen by [facility] and was taking cranberry, Hiprex, vaginal estrogen and D-mannose for UTI prevention. Also would take Azo/ uribel as needed for symptoms.   Urinary Symptoms: Leaks urine occasionally- when she feels like she has a UTI, but last urinalysis was negative. Does not currently leak urine.  Pad use: 1 liners/ mini-pads per day.   Myrbetriq did not work. Vesicare helped some but caused her blood pressure to increase- stopped about a week ago.  Day time voids 6.  Nocturia: 0 times per night to void. Voiding dysfunction: she empties her bladder well.  does not use a catheter to empty bladder.  When urinating, she feels a weak stream Drinks: 3-12oz cups of coffee, occasional diet soda, 24oz water per day  UTIs: 2 UTI's in the last year.   Reports history of kidney or bladder stones many years ago. Denies hematuria or flank pain.   Pelvic Organ Prolapse Symptoms:                  She Denies a feeling of a bulge the vaginal area.   Bowel Symptom: Bowel movements: 1 time(s) per day Stool consistency: soft  Straining: no.  Splinting: no.  Incomplete evacuation: no.  She Admits to occasional accidental  bowel leakage / fecal incontinence when she has too much fat in her diet.   Bowel regimen: none Last colonoscopy: Date 09/2020, Results negative  Sexual Function Sexually active: no.   Pelvic Pain Denies pelvic pain   Past Medical History:  Past Medical History:  Diagnosis Date  . Allergy   . Anemia, iron deficiency   . B12 deficiency   . CAP (community acquired pneumonia) 11/18/2014  . GERD (gastroesophageal reflux disease)   . Heartburn   . Iron deficiency   . Osteoarthritis   . Osteopenia   . Solitary pulmonary nodule 12/05/2014   Dx 11/19/14  3 mm > f/u 11/27/2015 > resolved, no further studies rec    . Tibial plateau fracture    june 2020- murphy/wainer cared for her  . Urinary tract infection      Past Surgical History:   Past Surgical History:  Procedure Laterality Date  . carpal tunnel right    . COLONOSCOPY    . COSMETIC SURGERY     tummy tuck  . GASTRIC BYPASS  2001  . POLYPECTOMY       Past OB/GYN History: G2 P2 Vaginal deliveries: 2,  Forceps/ Vacuum deliveries: 0, Cesarean section: 0 Menopausal: Yes, at age 28, Admits to vaginal bleeding since menopause- had hysteroscopy and recently started on HRT by her GYN Contraception: n/a. Last pap smear was 07/2020.  Any history of abnormal pap smears: no.   Medications: She has a current medication list which includes the following prescription(s): calcium carbonate, cyanocobalamin, [START ON  12/16/2020] estradiol, estradiol, fluticasone, multivitamin, omeprazole, progesterone, temazepam, valacyclovir, vitamin d (ergocalciferol), OVER THE COUNTER MEDICATION, and solifenacin.   Allergies: Patient has No Known Allergies.   Social History:  Social History   Tobacco Use  . Smoking status: Former Smoker    Packs/day: 1.00    Years: 5.00    Pack years: 5.00    Types: Cigarettes    Quit date: 02/10/1978    Years since quitting: 42.8  . Smokeless tobacco: Never Used  Vaping Use  . Vaping Use: Never used   Substance Use Topics  . Alcohol use: No  . Drug use: No    Relationship status: married She lives with husband.   She is employed as a Engineer, drilling. Regular exercise: No History of abuse: No  Family History:   Family History  Problem Relation Age of Onset  . Heart disease Father   . Lung cancer Mother        stage IV, smoker  . Heart disease Sister        Hypertrophic cardiomyopathy  . Uterine cancer Sister   . Colon cancer Neg Hx   . Esophageal cancer Neg Hx   . Rectal cancer Neg Hx   . Stomach cancer Neg Hx   . Colon polyps Neg Hx      Review of Systems: Review of Systems  Constitutional: Negative for fever, malaise/fatigue and weight loss.  Respiratory: Negative for cough, shortness of breath and wheezing.   Cardiovascular: Negative for chest pain, palpitations and leg swelling.  Gastrointestinal: Negative for abdominal pain and blood in stool.  Genitourinary: Negative for dysuria.  Musculoskeletal: Negative for myalgias.  Skin: Negative for rash.  Neurological: Negative for dizziness and headaches.  Endo/Heme/Allergies: Does not bruise/bleed easily.       + hot flashes  Psychiatric/Behavioral: Negative for depression. The patient is not nervous/anxious.      OBJECTIVE Physical Exam: Vitals:   12/13/20 1110  BP: (!) 148/94  Pulse: 62  Weight: 213 lb 8 oz (96.8 kg)  Height: 5' 7.5" (1.715 m)    Physical Exam Constitutional:      General: She is not in acute distress. Pulmonary:     Effort: Pulmonary effort is normal.  Abdominal:     General: There is no distension.     Palpations: Abdomen is soft.     Tenderness: There is no abdominal tenderness. There is no rebound.  Musculoskeletal:        General: No swelling. Normal range of motion.  Skin:    General: Skin is warm and dry.     Findings: No rash.  Neurological:     Mental Status: She is alert and oriented to person, place, and time.  Psychiatric:        Mood and Affect: Mood normal.         Behavior: Behavior normal.     GU / Detailed Urogynecologic Evaluation:  Pelvic Exam: Normal external female genitalia; Bartholin's and Skene's glands normal in appearance; urethral meatus normal in appearance, no urethral masses or discharge.   CST: negative  Speculum exam reveals normal vaginal mucosa with atrophy. Cervix normal appearance. Uterus normal single, nontender. Adnexa no mass, fullness, tenderness.    Pelvic floor strength I/V  Pelvic floor musculature: Right levator non-tender, Right obturator non-tender, Left levator non-tender, Left obturator non-tender  POP-Q:   POP-Q  -2.5  Aa   -2                                           Ba  -8                                              C   2                                            Gh  3                                            Pb  9                                            tvl   -1                                            Ap  -1                                            Bp  -9                                              D     Rectal Exam:  Normal external rectum  Post-Void Residual (PVR) by Bladder Scan: In order to evaluate bladder emptying, we discussed obtaining a postvoid residual and she agreed to this procedure.  Procedure: The ultrasound unit was placed on the patient's abdomen in the suprapubic region after the patient had voided. A PVR of 12 ml was obtained by bladder scan.  Laboratory Results: POC urine: negative  I visualized the urine specimen, noting the specimen to be clear yellow  ASSESSMENT AND PLAN Alexis Wallace is a 65 y.o. with:  1. Recurrent UTI   2. Urinary urgency   3. Vaginal atrophy    1. Recurrent UTI For treatment of recurrent urinary tract infections, we discussed management of recurrent UTIs including prophylaxis with a daily low dose antibiotic, transvaginal estrogen therapy, D-mannose, and cranberry  supplements.  - Since she has not had a UTI now in several months, will start with preventative measures- D-mannose and cranberry daily. She will also start Estrace 0.5g nightly for two weeks then twice a week after. Instructed to place pea size amount in vagina and near urethra.   2. Urinary urgency - Noticed an improvement with vesicare. No longer having leakage. Symptoms have not restarted since stopping the medication.  - She will work on reducing bladder irritants, specifically  coffee, and drinking more water.   3. Vaginal atrophy - Will start estrace vaginal cream as outlined above  Return in 4 months or sooner if needed to review progress   Marguerita Beards, MD   Time spent: I spent 45 minutes dedicated to the care of this patient on the date of this encounter to include pre-visit review of records, face-to-face time with the patient and post visit documentation and ordering medication/ testing.

## 2020-12-12 ENCOUNTER — Other Ambulatory Visit: Payer: Self-pay | Admitting: Family Medicine

## 2020-12-12 NOTE — Telephone Encounter (Signed)
LR: 03-22-2020 Qty: 30 with 3 refills  Last office visit: 11-25-2020 Upcoming appointment: 05-09-2021

## 2020-12-13 ENCOUNTER — Encounter: Payer: Self-pay | Admitting: Obstetrics and Gynecology

## 2020-12-13 ENCOUNTER — Other Ambulatory Visit: Payer: Self-pay

## 2020-12-13 ENCOUNTER — Ambulatory Visit (INDEPENDENT_AMBULATORY_CARE_PROVIDER_SITE_OTHER): Payer: Medicare PPO | Admitting: Obstetrics and Gynecology

## 2020-12-13 VITALS — BP 148/94 | HR 62 | Ht 67.5 in | Wt 213.5 lb

## 2020-12-13 DIAGNOSIS — N390 Urinary tract infection, site not specified: Secondary | ICD-10-CM

## 2020-12-13 DIAGNOSIS — R3915 Urgency of urination: Secondary | ICD-10-CM | POA: Diagnosis not present

## 2020-12-13 DIAGNOSIS — N952 Postmenopausal atrophic vaginitis: Secondary | ICD-10-CM | POA: Diagnosis not present

## 2020-12-13 LAB — POCT URINALYSIS DIPSTICK
Appearance: NORMAL
Bilirubin, UA: NEGATIVE
Blood, UA: NEGATIVE
Glucose, UA: NEGATIVE
Ketones, UA: NEGATIVE
Leukocytes, UA: NEGATIVE
Nitrite, UA: NEGATIVE
Protein, UA: NEGATIVE
Urobilinogen, UA: 0.2 E.U./dL
pH, UA: 6 (ref 5.0–8.0)

## 2020-12-13 MED ORDER — ESTRADIOL 0.1 MG/GM VA CREA: 0.5000 g | TOPICAL_CREAM | VAGINAL | 11 refills | Status: DC

## 2020-12-13 NOTE — Patient Instructions (Addendum)
Today we talked about ways to manage bladder urgency such as altering your diet to avoid irritative beverages and foods (bladder diet) as well as attempting to decrease stress and other exacerbating factors.    The Most Bothersome Foods* The Least Bothersome Foods*  Coffee - Regular & Decaf Tea - caffeinated Carbonated beverages - cola, non-colas, diet & caffeine-free Alcohols - Beer, Red Wine, White Wine, Champagne Fruits - Grapefruit, [place], Orange, [employer] - Cranberry, Grapefruit, Orange, Pineapple Vegetables - Tomato & Tomato Products Flavor Enhancers - Hot peppers, Spicy foods, Chili, Horseradish, Vinegar, Monosodium glutamate (MSG) Artificial Sweeteners - NutraSweet, Sweet 'N Low, Equal (sweetener), Saccharin Ethnic foods - [place], [place], [place] food Express Scripts - low-fat & whole Fruits - Bananas, Blueberries, Honeydew melon, Pears, Raisins, Watermelon Vegetables - Broccoli, Brussels Sprouts, Bowring, Carrots, Cauliflower, [place], Cucumber, Mushrooms, Peas, Radishes, Squash, Zucchini, White potatoes, Sweet potatoes & yams Poultry - Chicken, Eggs, [place], [employer] - Beef, Programmer, multimedia, Lamb Seafood - Shrimp, [place] fish, Salmon Grains - Oat, Rice Snacks - Pretzels, Popcorn  *Lissa Morales et al. Diet and its role in interstitial cystitis/bladder pain syndrome (IC/BPS) and comorbid conditions. [place] 2012 Jan 11.    Start cranberry supplements and D-mannose as well as the vaginal estrogen to prevent UTIs.  Start vaginal estrogen therapy nightly for two weeks then 2 times weekly at night for treatment of vaginal atrophy (dryness of the vaginal tissues).  Please let us know if the prescription is too expensive and we can look for alternative options.

## 2020-12-14 ENCOUNTER — Encounter: Payer: Self-pay | Admitting: Obstetrics and Gynecology

## 2020-12-17 ENCOUNTER — Encounter: Payer: Self-pay | Admitting: Family Medicine

## 2020-12-25 DIAGNOSIS — Z20822 Contact with and (suspected) exposure to covid-19: Secondary | ICD-10-CM | POA: Diagnosis not present

## 2021-01-13 ENCOUNTER — Ambulatory Visit
Admission: EM | Admit: 2021-01-13 | Discharge: 2021-01-13 | Disposition: A | Discharge status: Home / Self Care | Payer: Medicare PPO | Attending: Emergency Medicine | Admitting: Emergency Medicine

## 2021-01-13 ENCOUNTER — Other Ambulatory Visit: Payer: Self-pay

## 2021-01-13 ENCOUNTER — Encounter: Payer: Self-pay | Admitting: Family Medicine

## 2021-01-13 DIAGNOSIS — R300 Dysuria: Secondary | ICD-10-CM | POA: Insufficient documentation

## 2021-01-13 DIAGNOSIS — N390 Urinary tract infection, site not specified: Secondary | ICD-10-CM | POA: Diagnosis not present

## 2021-01-13 DIAGNOSIS — R3989 Other symptoms and signs involving the genitourinary system: Secondary | ICD-10-CM | POA: Insufficient documentation

## 2021-01-13 LAB — POCT URINALYSIS DIP (MANUAL ENTRY)
Bilirubin, UA: NEGATIVE
Glucose, UA: NEGATIVE mg/dL
Ketones, POC UA: NEGATIVE mg/dL
Nitrite, UA: NEGATIVE
Protein Ur, POC: 30 mg/dL — AB
Spec Grav, UA: >=1.03 — AB (ref 1.010–1.025)
Urobilinogen, UA: 1 E.U./dL
pH, UA: 6.5 (ref 5.0–8.0)

## 2021-01-13 MED ORDER — SULFAMETHOXAZOLE-TRIMETHOPRIM 800-160 MG PO TABS: 1.0000 | ORAL_TABLET | Freq: Two times a day (BID) | ORAL | 0 refills | Status: AC

## 2021-01-13 NOTE — Discharge Instructions (Addendum)
Urine culture sent.  We will call you with the results.   Push fluids and get plenty of rest.   Take antibiotic as directed and to completionf Follow up with PCP if symptoms persists Return here or go to ER if you have any new or worsening symptoms such as fever, worsening abdominal pain, nausea/vomiting, flank pain, etc..Marland Kitchen

## 2021-01-13 NOTE — ED Provider Notes (Signed)
[facility]   Chief Complaint  Patient presents with  . Dysuria     SUBJECTIVE:  Alexis Wallace is a 65 y.o. female presented to the urgent care with a complaint of dysuria, lower abdominal spasm and bladder pressure for the past week.  Denies any precipitating.  Localizes spasms in the lower abdomen.  It is intermittent and described as aching character.  Has tried OTC medications without relief.  Symptoms are made worse with urination.  Admits to similar symptoms in the past.  Denies fever, chills, nausea, vomiting, abdominal pain, flank pain, abnormal vaginal discharge or bleeding, hematuria.    LMP: No LMP recorded. Patient is postmenopausal.  ROS: As in HPI.  All other pertinent ROS negative.     Past Medical History:  Diagnosis Date  . Allergy   . Anemia, iron deficiency   . B12 deficiency   . CAP (community acquired pneumonia) 11/18/2014  . GERD (gastroesophageal reflux disease)   . Heartburn   . Iron deficiency   . Osteoarthritis   . Osteopenia   . Solitary pulmonary nodule 12/05/2014   Dx 11/19/14  3 mm > f/u 11/27/2015 > resolved, no further studies rec    . Tibial plateau fracture    june 2020- murphy/wainer cared for her  . Urinary tract infection    Past Surgical History:  Procedure Laterality Date  . carpal tunnel right    . COLONOSCOPY    . COSMETIC SURGERY     tummy tuck  . GASTRIC BYPASS  2001  . POLYPECTOMY     No Known Allergies No current facility-administered medications on file prior to encounter.   Current Outpatient Medications on File Prior to Encounter  Medication Sig Dispense Refill  . calcium carbonate 200 MG capsule Take 3,000 mg by mouth daily.    . cyanocobalamin (,VITAMIN B-12,) 1000 MCG/ML injection INJECT 1ML (1000MCG TOTAL) INTO THE MUSCLE EVERY 30 DAYS. 3 mL 3  . estradiol (ESTRACE) 0.1 MG/GM vaginal cream Place 0.5 g vaginally 2 (two) times a week. Place 0.5g nightly for two weeks then twice a week after 30 g 11   . estradiol (ESTRACE) 2 MG tablet Take 2 mg by mouth daily.    . fluticasone (FLONASE) 50 MCG/ACT nasal spray USE 2 SPRAYS IN EACH NOSTRIL ONCE DAILY 16 g 1  . Multiple Vitamin (MULTIVITAMIN) tablet Take 1 tablet by mouth daily.    Marland Kitchen omeprazole (PRILOSEC) 20 MG capsule TAKE ONE (1) CAPSULE EACH DAY 90 capsule 3  . progesterone (PROMETRIUM) 200 MG capsule TAKE ONE CAPSULE BY MOUTH DAILY AT BEDTIME.    . temazepam (RESTORIL) 15 MG capsule TAKE ONE CAPSULE (15MG  TOTAL) BY MOUTH AT BEDTIME AS NEEDED FOR SLEEP 30 capsule 3  . valACYclovir (VALTREX) 1000 MG tablet Take 2 pills twice a day for 1 day at first sign of cold sore 30 tablet 1  . Vitamin D, Ergocalciferol, (DRISDOL) 1.25 MG (50000 UNIT) CAPS capsule Take 1 capsule (50,000 Units total) by mouth every 7 (seven) days. 12 capsule 1  . OVER THE COUNTER MEDICATION Vitamin D 3 one capsule daily. (Patient not taking: Reported on 12/13/2020)    . solifenacin (VESICARE) 5 MG tablet Take 1 tablet (5 mg total) by mouth daily. (Patient not taking: Reported on 12/13/2020) 30 tablet 3   Social History   Socioeconomic History  . Marital status: Married    Spouse name: Not on file  . Number of children: 2  . Years of education: Not  on file  . Highest education level: Not on file  Occupational History  . Not on file  Tobacco Use  . Smoking status: Former Smoker    Packs/day: 1.00    Years: 5.00    Pack years: 5.00    Types: Cigarettes    Quit date: 02/10/1978    Years since quitting: 42.9  . Smokeless tobacco: Never Used  Vaping Use  . Vaping Use: Never used  Substance and Sexual Activity  . Alcohol use: No  . Drug use: No  . Sexual activity: Not Currently    Birth control/protection: Post-menopausal  Other Topics Concern  . Not on file  Social History Narrative   Married to [employer]. 2 kids. Jenny Reichmann (his wife had TBI being struck by car) with daughter [name]. Patient takes care of [name] and son lives with her and husband) and Colletta Maryland lives in  [place] with no kids.       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  Raising granddaughter after daughter has TBI.       Hobbies: spending time with friends and family          Social Determinants of Health   Financial Resource Strain: Low Risk   . Difficulty of Paying Living Expenses: Not hard at all  Food Insecurity: No Food Insecurity  . Worried About Charity fundraiser in the Last Year: Never true  . Ran Out of Food in the Last Year: Never true  Transportation Needs: No Transportation Needs  . Lack of Transportation (Medical): No  . Lack of Transportation (Non-Medical): No  Physical Activity: Insufficiently Active  . Days of Exercise per Week: 3 days  . Minutes of Exercise per Session: 30 min  Stress: No Stress Concern Present  . Feeling of Stress : Not at all  Social Connections: Moderately Integrated  . Frequency of Communication with Friends and Family: More than three times a week  . Frequency of Social Gatherings with Friends and Family: More than three times a week  . Attends Religious Services: Never  . Active Member of Clubs or Organizations: Yes  . Attends Archivist Meetings: 1 to 4 times per year  . Marital Status: Married  Human resources officer Violence: Not At Risk  . Fear of Current or Ex-Partner: No  . Emotionally Abused: No  . Physically Abused: No  . Sexually Abused: No   Family History  Problem Relation Age of Onset  . Heart disease Father   . Lung cancer Mother        stage IV, smoker  . Heart disease Sister        Hypertrophic cardiomyopathy  . Uterine cancer Sister   . Colon cancer Neg Hx   . Esophageal cancer Neg Hx   . Rectal cancer Neg Hx   . Stomach cancer Neg Hx   . Colon polyps Neg Hx     OBJECTIVE:  Vitals:   01/13/21 1522  BP: (!) 145/83  Pulse: 66  Resp: 18  SpO2: 99%   General appearance: AOx3 in no acute  distress HEENT: NCAT.  Oropharynx clear.  Lungs: clear to auscultation bilaterally without adventitious breath sounds Heart: regular rate and rhythm.  Radial pulses 2+ symmetrical bilaterally Abdomen: soft; non-distended; no tenderness; bowel sounds present; no guarding or rebound tenderness Back: no CVA tenderness Extremities: no edema; symmetrical with no gross deformities Skin: warm and  dry Neurologic: Ambulates from chair to exam table without difficulty Psychological: alert and cooperative; normal mood and affect  Labs Reviewed  POCT URINALYSIS DIP (MANUAL ENTRY) - Abnormal; Notable for the following components:      Result Value   Color, UA green (*)    Clarity, UA cloudy (*)    Spec Grav, UA >=1.030 (*)    Blood, UA large (*)    Protein Ur, POC =30 (*)    Leukocytes, UA Small (1+) (*)    All other components within normal limits  URINE CULTURE    ASSESSMENT & PLAN:  1. Dysuria   2. Acute UTI   3. Sensation of pressure in bladder area     Meds ordered this encounter  Medications  . sulfamethoxazole-trimethoprim (BACTRIM DS) 800-160 MG tablet    Sig: Take 1 tablet by mouth 2 (two) times daily for 7 days.    Dispense:  14 tablet    Refill:  0    Discharge instructions  Urine culture sent.  We will call you with the results.   Push fluids and get plenty of rest.   Take antibiotic as directed and to completionf Follow up with PCP if symptoms persists Return here or go to ER if you have any new or worsening symptoms such as fever, worsening abdominal pain, nausea/vomiting, flank pain, etc...   Outlined signs and symptoms indicating need for more acute intervention. Patient verbalized understanding. After Visit Summary given.     Emerson Monte, FNP 01/13/21 1626

## 2021-01-13 NOTE — ED Triage Notes (Signed)
Pt reports bladder pressure, dysuria, lower abdominal spasms, mid-lower back pain, chills.  All started last week, worsening over the weekend.

## 2021-01-16 LAB — URINE CULTURE: Culture: >=100000 — AB

## 2021-01-17 DIAGNOSIS — N958 Other specified menopausal and perimenopausal disorders: Secondary | ICD-10-CM | POA: Diagnosis not present

## 2021-01-17 DIAGNOSIS — Z1231 Encounter for screening mammogram for malignant neoplasm of breast: Secondary | ICD-10-CM | POA: Diagnosis not present

## 2021-01-17 DIAGNOSIS — M816 Localized osteoporosis [Lequesne]: Secondary | ICD-10-CM | POA: Diagnosis not present

## 2021-01-17 DIAGNOSIS — Z01419 Encounter for gynecological examination (general) (routine) without abnormal findings: Secondary | ICD-10-CM | POA: Diagnosis not present

## 2021-01-17 LAB — HM DEXA SCAN

## 2021-01-25 ENCOUNTER — Encounter: Payer: Self-pay | Admitting: Family Medicine

## 2021-01-30 NOTE — Telephone Encounter (Signed)
Called patient and she states she is feeling better, and is declining an appointment at this time.

## 2021-02-13 NOTE — Progress Notes (Signed)
[facility] Return Visit  SUBJECTIVE  History of Present Illness: Alexis Wallace is a 65 y.o. female seen in follow-up for recurrent UTI. Plan at last visit was to start vaginal estrogen, D-mannose and cranberry tablets. Had a urinary tract infection on 01/13/21 (>100,000 E. Coli) and was treated with bactrim.   After she had her UTI, has had more urgency and frequency lately. Took Azo and this helped. Did also have some blood in urine when she had the UTI.  Marland Kitchen Has been using the estrogen cream, D-mannose and cranberry.   Past Medical History: Patient  has a past medical history of Allergy, Anemia, iron deficiency, B12 deficiency, CAP (community acquired pneumonia) (11/18/2014), GERD (gastroesophageal reflux disease), Heartburn, Iron deficiency, Osteoarthritis, Osteopenia, Solitary pulmonary nodule (12/05/2014), Tibial plateau fracture, and Urinary tract infection.   Past Surgical History: She  has a past surgical history that includes Gastric bypass (2001); Cosmetic surgery; carpal tunnel right; Colonoscopy; and Polypectomy.   Medications: She has a current medication list which includes the following prescription(s): calcium carbonate, cyanocobalamin, estradiol, estradiol, fluticasone, multivitamin, omeprazole, OVER THE COUNTER MEDICATION, progesterone, sulfamethoxazole-trimethoprim, temazepam, trospium chloride, valacyclovir, and vitamin d (ergocalciferol).   Allergies: Patient has No Known Allergies.   Social History: Patient  reports that she quit smoking about 43 years ago. Her smoking use included cigarettes. She has a 5.00 pack-year smoking history. She has never used smokeless tobacco. She reports that she does not drink alcohol and does not use drugs.      OBJECTIVE     Physical Exam: Vitals:   02/18/21 1258  BP: 119/77  Pulse: 65  Weight: 213 lb (96.6 kg)   Gen: No apparent distress, A&O x 3.  Detailed Urogynecologic Evaluation:  Deferred.     ASSESSMENT AND PLAN    [name] is a 65 y.o. with:  1. Overactive bladder   2. Recurrent UTI    1. OAB - We discussed options for OAB including restarting a medication and third line therapies. She is concerned about the cognitive side effects for anticholinergics. Myrbetriq did not work for her previously. We discussed that Trospium does not cross the blood brain barrier so may be a good alternative. Prescribed Trospium 60mg  ER daily. If this is cost prohibitive then she would like to go back on the vesicare.   2. Recurrent UTI - Currently using estrogen, D-mannose and cranberry and had breakthrough UTI - Will start antibiotic prophylaxis- Bactrim DS 1 tab daily for 6 months.  - If blood in urine recurs without infection, then will need further workup.   Return 6 weeks for follow up  Jaquita Folds, MD  Time spent: I spent 20 minutes dedicated to the care of this patient on the date of this encounter to include pre-visit review of records, face-to-face time with the patient and post visit documentation and ordering medication/ testing.

## 2021-02-17 ENCOUNTER — Ambulatory Visit: Payer: Medicare PPO | Admitting: Urology

## 2021-02-18 ENCOUNTER — Encounter: Payer: Self-pay | Admitting: Obstetrics and Gynecology

## 2021-02-18 ENCOUNTER — Ambulatory Visit (INDEPENDENT_AMBULATORY_CARE_PROVIDER_SITE_OTHER): Payer: Medicare PPO | Admitting: Obstetrics and Gynecology

## 2021-02-18 VITALS — BP 119/77 | HR 65 | Wt 213.0 lb

## 2021-02-18 DIAGNOSIS — N390 Urinary tract infection, site not specified: Secondary | ICD-10-CM | POA: Diagnosis not present

## 2021-02-18 DIAGNOSIS — N3281 Overactive bladder: Secondary | ICD-10-CM

## 2021-02-18 MED ORDER — TROSPIUM CHLORIDE ER 60 MG PO CP24: 1.0000 | ORAL_CAPSULE | Freq: Every day | ORAL | 5 refills | Status: DC

## 2021-02-18 MED ORDER — SULFAMETHOXAZOLE-TRIMETHOPRIM 400-80 MG PO TABS: 1.0000 | ORAL_TABLET | Freq: Every day | ORAL | 5 refills | Status: DC

## 2021-02-19 ENCOUNTER — Encounter: Payer: Self-pay | Admitting: Family Medicine

## 2021-02-24 ENCOUNTER — Telehealth (INDEPENDENT_AMBULATORY_CARE_PROVIDER_SITE_OTHER): Payer: Medicare PPO | Admitting: Family Medicine

## 2021-02-24 ENCOUNTER — Encounter: Payer: Self-pay | Admitting: Family Medicine

## 2021-02-24 VITALS — BP 139/80 | HR 67 | Temp 98.1°F | Ht 67.5 in | Wt 210.0 lb

## 2021-02-24 DIAGNOSIS — E559 Vitamin D deficiency, unspecified: Secondary | ICD-10-CM

## 2021-02-24 DIAGNOSIS — E669 Obesity, unspecified: Secondary | ICD-10-CM

## 2021-02-24 MED ORDER — SEMAGLUTIDE-WEIGHT MANAGEMENT 0.5 MG/0.5ML ~~LOC~~ SOAJ: 0.5000 mg | SUBCUTANEOUS | 0 refills | Status: AC

## 2021-02-24 MED ORDER — SEMAGLUTIDE-WEIGHT MANAGEMENT 0.25 MG/0.5ML ~~LOC~~ SOAJ: 0.2500 mg | SUBCUTANEOUS | 0 refills | Status: DC

## 2021-02-24 MED ORDER — SEMAGLUTIDE-WEIGHT MANAGEMENT 1 MG/0.5ML ~~LOC~~ SOAJ: 1.0000 mg | SUBCUTANEOUS | 0 refills | Status: AC

## 2021-02-24 MED ORDER — SEMAGLUTIDE-WEIGHT MANAGEMENT 2.4 MG/0.75ML ~~LOC~~ SOAJ: 2.4000 mg | SUBCUTANEOUS | 1 refills | Status: DC

## 2021-02-24 MED ORDER — SEMAGLUTIDE-WEIGHT MANAGEMENT 1.7 MG/0.75ML ~~LOC~~ SOAJ: 1.7000 mg | SUBCUTANEOUS | 0 refills | Status: AC

## 2021-02-24 NOTE — Progress Notes (Signed)
Phone [phone] Virtual visit via Video note   Subjective:  Chief complaint: Chief Complaint  Patient presents with  . Medication Management   This visit type was conducted due to national recommendations for restrictions regarding the COVID-19 Pandemic (e.g. social distancing).  This format is felt to be most appropriate for this patient at this time balancing risks to patient and risks to population by having him in for in person visit.  No physical exam was performed (except for noted visual exam or audio findings with Telehealth visits).    Our team/I connected with Alexis Wallace at  3:00 PM EDT by a video enabled telemedicine application (doxy.me or caregility through epic) and verified that I am speaking with the correct person using two identifiers.  Location patient: [place] Location provider: [facility], office Persons participating in the virtual visit:  patient  Our team/I discussed the limitations of evaluation and management by telemedicine and the availability of in person appointments. In light of current covid-19 pandemic, patient also understands that we are trying to protect them by minimizing in office contact if at all possible.  The patient expressed consent for telemedicine visit and agreed to proceed. Patient understands insurance will be billed.   Past Medical History-  Patient Active Problem List   Diagnosis Date Noted  . Recurrent UTI 04/07/2016    Priority: High  . Anxiety 12/13/2018    Priority: Medium  . Hyperlipidemia 05/20/2017    Priority: Medium  . History of adenomatous polyp of colon 10/12/2016    Priority: Medium  . Rosacea 08/30/2014    Priority: Medium  . Insomnia 08/30/2014    Priority: Medium  . Bariatric surgery status 09/01/2010    Priority: Medium  . 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 ANEMIA, B12 DEFICIENCY 08/15/2007    Priority: Medium  . Plantar fasciitis, right 12/13/2014    Priority: Low  .  Solitary pulmonary nodule 12/05/2014    Priority: Low  . CAP (community acquired pneumonia) 11/18/2014    Priority: Low  . Family history of hypertrophic cardiomyopathy 05/13/2011    Priority: Low  . Actinic keratosis 10/22/2010    Priority: Low  . 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 ALLERGIC RHINITIS 03/01/2008    Priority: Low  . ANEMIA-IRON DEFICIENCY 11/21/2007    Priority: Low  . GERD 06/02/2007    Priority: Low  . PSORIASIS 06/02/2007    Priority: Low  . OSTEOARTHRITIS 06/02/2007    Priority: Low  . OSTEOPENIA 06/02/2007    Priority: Low    Medications- reviewed and updated Current Outpatient Medications  Medication Sig Dispense Refill  . calcium carbonate 200 MG capsule Take 3,000 mg by mouth daily.    . cyanocobalamin (,VITAMIN B-12,) 1000 MCG/ML injection INJECT 1ML (1000MCG TOTAL) INTO THE MUSCLE EVERY 30 DAYS. 3 mL 3  . estradiol (ESTRACE) 0.1 MG/GM vaginal cream Place 0.5 g vaginally 2 (two) times a week. Place 0.5g nightly for two weeks then twice a week after 30 g 11  . estradiol (ESTRACE) 2 MG tablet Take 2 mg by mouth daily.    . fluticasone (FLONASE) 50 MCG/ACT nasal spray USE 2 SPRAYS IN EACH NOSTRIL ONCE DAILY 16 g 1  . Multiple Vitamin (MULTIVITAMIN) tablet Take 1 tablet by mouth daily.    Marland Kitchen omeprazole (PRILOSEC) 20 MG capsule TAKE ONE (1) CAPSULE EACH DAY 90 capsule 3  . OVER THE COUNTER MEDICATION Vitamin D  3 one capsule daily.    . progesterone (PROMETRIUM) 200 MG capsule TAKE ONE CAPSULE BY MOUTH DAILY AT BEDTIME.    . Semaglutide-Weight Management 0.25 MG/0.5ML SOAJ Inject 0.25 mg into the skin once a week for 28 days. Please provide pen needles 2 mL 0  . [START ON 03/25/2021] Semaglutide-Weight Management 0.5 MG/0.5ML SOAJ Inject 0.5 mg into the skin once a week for 28 days. Please provide pen needles 2 mL 0  . [START ON 04/23/2021] Semaglutide-Weight Management 1 MG/0.5ML SOAJ Inject 1 mg into the skin once a  week for 28 days. Please provide pen needles 2 mL 0  . [START ON 05/22/2021] Semaglutide-Weight Management 1.7 MG/0.75ML SOAJ Inject 1.7 mg into the skin once a week for 28 days. Please provide pen needles 3 mL 0  . [START ON 06/20/2021] Semaglutide-Weight Management 2.4 MG/0.75ML SOAJ Inject 2.4 mg into the skin once a week for 28 days. Please provide pen needles 3 mL 1  . sulfamethoxazole-trimethoprim (BACTRIM) 400-80 MG tablet Take 1 tablet by mouth daily. 30 tablet 5  . temazepam (RESTORIL) 15 MG capsule TAKE ONE CAPSULE (15MG  TOTAL) BY MOUTH AT BEDTIME AS NEEDED FOR SLEEP 30 capsule 3  . Trospium Chloride 60 MG CP24 Take 1 capsule (60 mg total) by mouth daily. 30 capsule 5  . valACYclovir (VALTREX) 1000 MG tablet Take 2 pills twice a day for 1 day at first sign of cold sore 30 tablet 1   No current facility-administered medications for this visit.     Objective:  BP 139/80   Pulse 67   Temp 98.1 F (36.7 C) (Temporal)   Ht 5' 7.5" (1.715 m)   Wt 210 lb (95.3 kg)   BMI 32.41 kg/m  self reported vitals Gen: NAD, resting comfortably Lungs: nonlabored, normal respiratory rate  Skin: appears dry, no obvious rash     Assessment and Plan   # Obesity S:patient struggling with weight management.  Lowest she got after bypass was 165  Currently going to gym 2-3 x a week.  Going to [employer]. She states the diet is not that bad but she just wants to eat more frequently - has a hard time with compliance with diet. She has lost 6 lbs since august- congratulated her on that  Father died of heart disease of 66 and sister died of same age and that weighs heavily on her.   She had gastric bypass in 2001. Patient states used to have issues with blood sugar but none since that time. Has not had issues- last blood sugar 74 fasting.  A/P: Poor control despite patients best efforts. I recommended healthy weight to wellness program  But she declines. Shed like to try combo of exercise, weight  watchers and wegovy (has done extensive reading and we did counseling today). I think this is reasonable to try. We discussed minimum weight loss goal of 11 lbs within 3-6 months to continue. Titrate up as below - reports prior blood sugar issues but last CBG <80 fasting and  A1c on life insurance screening feb 11th of 5.4. discussed risk of low blood sugars as well  #Vitamin D deficiency S: Medication: finished 50k units- trying to decide between 5k units daily  (GYN recommended)or 50k weekly  Last vitamin D Lab Results  Component Value Date   VD25OH 27 (L) 11/25/2020  A/P: I am fine with her transitioning to 5k units daily- we need to keep an eye on vitamin D with next labs to make  sure does not get too high as well    Recommended follow up: may push out CPE a few months Future Appointments  Date Time Provider [place]  04/04/2021  8:40 AM Jaquita Folds, MD [facility]  04/25/2021  1:20 PM Jaquita Folds, MD [facility]  05/09/2021 10:40 AM Marin Olp, MD [facility]    Lab/Order associations:   ICD-10-CM   1. Obesity (BMI 30-39.9)  E66.9   2. Vitamin D deficiency  E55.9     Meds ordered this encounter  Medications  . Semaglutide-Weight Management 0.25 MG/0.5ML SOAJ    Sig: Inject 0.25 mg into the skin once a week for 28 days. Please provide pen needles    Dispense:  2 mL    Refill:  0  . Semaglutide-Weight Management 0.5 MG/0.5ML SOAJ    Sig: Inject 0.5 mg into the skin once a week for 28 days. Please provide pen needles    Dispense:  2 mL    Refill:  0  . Semaglutide-Weight Management 1 MG/0.5ML SOAJ    Sig: Inject 1 mg into the skin once a week for 28 days. Please provide pen needles    Dispense:  2 mL    Refill:  0  . Semaglutide-Weight Management 1.7 MG/0.75ML SOAJ    Sig: Inject 1.7 mg into the skin once a week for 28 days. Please provide pen needles    Dispense:  3 mL    Refill:  0  . Semaglutide-Weight Management 2.4 MG/0.75ML SOAJ     Sig: Inject 2.4 mg into the skin once a week for 28 days. Please provide pen needles    Dispense:  3 mL    Refill:  1     Return precautions advised.  [name], MD

## 2021-02-24 NOTE — Patient Instructions (Addendum)
Health Maintenance Due  Topic Date Due  . COVID-19 Vaccine (4 - Booster for Pfizer series) Discuss 01/31/2021    Depression screen [facility] 2/9 02/24/2021 10/24/2020 04/26/2020  Decreased Interest 0 0 0  Down, Depressed, Hopeless 0 0 0  PHQ - 2 Score 0 0 0  Altered sleeping - - 0  Tired, decreased energy - - 0  Change in appetite - - 0  Feeling bad or failure about yourself  - - 0  Trouble concentrating - - 0  Moving slowly or fidgety/restless - - 0  Suicidal thoughts - - 0  PHQ-9 Score - - 0  Difficult doing work/chores - - Not difficult at all    Recommended follow up: No follow-ups on file.

## 2021-03-17 ENCOUNTER — Other Ambulatory Visit: Payer: Self-pay | Admitting: Family Medicine

## 2021-03-28 ENCOUNTER — Ambulatory Visit: Payer: Medicare PPO | Admitting: Family Medicine

## 2021-04-02 NOTE — Progress Notes (Signed)
Alexis Wallace [facility] Return Visit  SUBJECTIVE  History of Present Illness: Alexis Wallace is a 65 y.o. female seen in follow-up for overactive bladder and recurrent UTI. Plan at last visit was to start bactrim prophylaxis and trospium 60mg  ER.   Denies any burning, urgency and frequency but feels her urine has a strong odor. Left a urine sample.   Has had improvement with urgency, especially in the morning. She is happy with the medication.    Past Medical History: Patient  has a past medical history of Allergy, Anemia, iron deficiency, B12 deficiency, CAP (community acquired pneumonia) (11/18/2014), GERD (gastroesophageal reflux disease), Heartburn, Iron deficiency, Osteoarthritis, Osteopenia, Solitary pulmonary nodule (12/05/2014), Tibial plateau fracture, and Urinary tract infection.   Past Surgical History: She  has a past surgical history that includes Gastric bypass (2001); Cosmetic surgery; carpal tunnel right; Colonoscopy; and Polypectomy.   Medications: She has a current medication list which includes the following prescription(s): calcium carbonate, cyanocobalamin, estradiol, estradiol, fluticasone, multivitamin, omeprazole, OVER THE COUNTER MEDICATION, progesterone, semaglutide-weight management, [START ON 04/23/2021] semaglutide-weight management, [START ON 05/22/2021] semaglutide-weight management, [START ON 06/20/2021] semaglutide-weight management, sulfamethoxazole-trimethoprim, temazepam, valacyclovir, wegovy, and trospium chloride.   Allergies: Patient has No Known Allergies.   Social History: Patient  reports that she quit smoking about 43 years ago. Her smoking use included cigarettes. She has a 5.00 pack-year smoking history. She has never used smokeless tobacco. She reports that she does not drink alcohol and does not use drugs.      OBJECTIVE     Physical Exam: Vitals:   04/04/21 0831  BP: 124/81  Pulse: 77  Weight: 210 lb (95.3 kg)  Height: 5' 7.5"  (1.715 m)   Gen: No apparent distress, A&O x 3.  Detailed Urogynecologic Evaluation:  Deferred.    ASSESSMENT AND PLAN    Alexis Wallace is a 65 y.o. with:  1. Bad odor of urine   2. Overactive bladder    - POC urine showed nitrites, so will send for culture - Cotinue bactrim prophylaxis- 1 tab daily. If culture comes back positive for urinary tract infection will treat based on sensitivities. Also discussed that if she does not have symptoms of UTI, then we do not need to obtain regular cultures.  - For OAB, continue with Trospium 60mg  daily. Refill provided. We discussed that after next visit we can consider discontinuing the medication to see if she needs it longer term.   Return 4 months  Alexis Folds, MD  Time spent: I spent 20 minutes dedicated to the care of this patient on the date of this encounter to include pre-visit review of records, face-to-face time with the patient and post visit documentation and ordering medication/ testing.

## 2021-04-04 ENCOUNTER — Encounter: Payer: Self-pay | Admitting: Obstetrics and Gynecology

## 2021-04-04 ENCOUNTER — Other Ambulatory Visit: Payer: Self-pay

## 2021-04-04 ENCOUNTER — Ambulatory Visit (INDEPENDENT_AMBULATORY_CARE_PROVIDER_SITE_OTHER): Payer: Medicare PPO | Admitting: Obstetrics and Gynecology

## 2021-04-04 VITALS — BP 124/81 | HR 77 | Ht 67.5 in | Wt 210.0 lb

## 2021-04-04 DIAGNOSIS — N3000 Acute cystitis without hematuria: Secondary | ICD-10-CM | POA: Diagnosis not present

## 2021-04-04 DIAGNOSIS — N3281 Overactive bladder: Secondary | ICD-10-CM | POA: Diagnosis not present

## 2021-04-04 DIAGNOSIS — R8290 Unspecified abnormal findings in urine: Secondary | ICD-10-CM

## 2021-04-04 LAB — POCT URINALYSIS DIPSTICK
Appearance: NORMAL
Bilirubin, UA: NEGATIVE
Blood, UA: NEGATIVE
Glucose, UA: NEGATIVE
Ketones, UA: NEGATIVE
Leukocytes, UA: NEGATIVE
Nitrite, UA: POSITIVE
Protein, UA: NEGATIVE
Spec Grav, UA: 1.01 (ref 1.010–1.025)
Urobilinogen, UA: 0.2 E.U./dL
pH, UA: 5 (ref 5.0–8.0)

## 2021-04-04 MED ORDER — TROSPIUM CHLORIDE ER 60 MG PO CP24: 1.0000 | ORAL_CAPSULE | Freq: Every day | ORAL | 5 refills | Status: DC

## 2021-04-08 LAB — URINE CULTURE

## 2021-04-08 MED ORDER — NITROFURANTOIN MONOHYD MACRO 100 MG PO CAPS: 100.0000 mg | ORAL_CAPSULE | Freq: Two times a day (BID) | ORAL | 0 refills | Status: DC

## 2021-04-08 NOTE — Addendum Note (Signed)
Addended by: Jaquita Folds on: 04/08/2021 05:57 PM   Modules accepted: Orders

## 2021-04-14 DIAGNOSIS — N644 Mastodynia: Secondary | ICD-10-CM | POA: Diagnosis not present

## 2021-04-17 ENCOUNTER — Other Ambulatory Visit: Payer: Self-pay | Admitting: Family Medicine

## 2021-04-18 ENCOUNTER — Ambulatory Visit: Payer: Medicare PPO | Admitting: Obstetrics and Gynecology

## 2021-04-24 ENCOUNTER — Encounter: Payer: Self-pay | Admitting: Obstetrics and Gynecology

## 2021-04-25 ENCOUNTER — Ambulatory Visit: Payer: Medicare PPO | Admitting: Obstetrics and Gynecology

## 2021-04-25 DIAGNOSIS — H2513 Age-related nuclear cataract, bilateral: Secondary | ICD-10-CM | POA: Diagnosis not present

## 2021-04-25 DIAGNOSIS — H5213 Myopia, bilateral: Secondary | ICD-10-CM | POA: Diagnosis not present

## 2021-04-25 DIAGNOSIS — H40023 Open angle with borderline findings, high risk, bilateral: Secondary | ICD-10-CM | POA: Diagnosis not present

## 2021-04-25 DIAGNOSIS — H04123 Dry eye syndrome of bilateral lacrimal glands: Secondary | ICD-10-CM | POA: Diagnosis not present

## 2021-05-08 ENCOUNTER — Encounter: Payer: Self-pay | Admitting: Obstetrics and Gynecology

## 2021-05-08 ENCOUNTER — Encounter: Payer: Self-pay | Admitting: Family Medicine

## 2021-05-08 DIAGNOSIS — N3000 Acute cystitis without hematuria: Secondary | ICD-10-CM

## 2021-05-08 MED ORDER — NITROFURANTOIN MONOHYD MACRO 100 MG PO CAPS: 100.0000 mg | ORAL_CAPSULE | Freq: Two times a day (BID) | ORAL | 0 refills | Status: AC

## 2021-05-09 ENCOUNTER — Ambulatory Visit: Payer: Medicare PPO | Admitting: Family Medicine

## 2021-05-09 ENCOUNTER — Encounter: Payer: Medicare PPO | Admitting: Family Medicine

## 2021-05-12 ENCOUNTER — Encounter: Payer: Self-pay | Admitting: Family Medicine

## 2021-05-14 ENCOUNTER — Other Ambulatory Visit: Payer: Self-pay

## 2021-05-14 ENCOUNTER — Telehealth: Payer: Self-pay

## 2021-05-14 ENCOUNTER — Other Ambulatory Visit: Payer: Self-pay | Admitting: Family Medicine

## 2021-05-14 NOTE — Telephone Encounter (Signed)
You may call pharmacy for early fill-if they need a new prescription you can send and state okay for early refill

## 2021-05-14 NOTE — Telephone Encounter (Signed)
Spoke with pharmacist and authorized early refill.

## 2021-05-14 NOTE — Telephone Encounter (Signed)
Ok to change fill date?

## 2021-05-14 NOTE — Telephone Encounter (Signed)
States just received a script for wegovy 1.7.  States that fill date is 6/16.  States [place].  Is requesting the fill date to be changed.

## 2021-06-26 DIAGNOSIS — L218 Other seborrheic dermatitis: Secondary | ICD-10-CM | POA: Diagnosis not present

## 2021-06-26 DIAGNOSIS — L820 Inflamed seborrheic keratosis: Secondary | ICD-10-CM | POA: Diagnosis not present

## 2021-06-26 DIAGNOSIS — D1801 Hemangioma of skin and subcutaneous tissue: Secondary | ICD-10-CM | POA: Diagnosis not present

## 2021-06-26 DIAGNOSIS — L821 Other seborrheic keratosis: Secondary | ICD-10-CM | POA: Diagnosis not present

## 2021-06-26 DIAGNOSIS — L814 Other melanin hyperpigmentation: Secondary | ICD-10-CM | POA: Diagnosis not present

## 2021-06-26 DIAGNOSIS — D224 Melanocytic nevi of scalp and neck: Secondary | ICD-10-CM | POA: Diagnosis not present

## 2021-06-26 DIAGNOSIS — D2362 Other benign neoplasm of skin of left upper limb, including shoulder: Secondary | ICD-10-CM | POA: Diagnosis not present

## 2021-07-18 ENCOUNTER — Encounter: Payer: Medicare PPO | Admitting: Family Medicine

## 2021-07-31 ENCOUNTER — Ambulatory Visit (INDEPENDENT_AMBULATORY_CARE_PROVIDER_SITE_OTHER): Payer: Medicare PPO | Admitting: Family Medicine

## 2021-07-31 ENCOUNTER — Encounter: Payer: Self-pay | Admitting: Family Medicine

## 2021-07-31 ENCOUNTER — Other Ambulatory Visit: Payer: Self-pay

## 2021-07-31 VITALS — BP 116/76 | HR 78 | Temp 97.7°F | Ht 67.5 in | Wt 186.0 lb

## 2021-07-31 DIAGNOSIS — E559 Vitamin D deficiency, unspecified: Secondary | ICD-10-CM | POA: Diagnosis not present

## 2021-07-31 DIAGNOSIS — K219 Gastro-esophageal reflux disease without esophagitis: Secondary | ICD-10-CM | POA: Diagnosis not present

## 2021-07-31 DIAGNOSIS — D518 Other vitamin B12 deficiency anemias: Secondary | ICD-10-CM | POA: Diagnosis not present

## 2021-07-31 DIAGNOSIS — Z0000 Encounter for general adult medical examination without abnormal findings: Secondary | ICD-10-CM | POA: Diagnosis not present

## 2021-07-31 DIAGNOSIS — E785 Hyperlipidemia, unspecified: Secondary | ICD-10-CM

## 2021-07-31 MED ORDER — CYANOCOBALAMIN 1000 MCG/ML IJ SOLN: INTRAMUSCULAR | 3 refills | Status: DC

## 2021-07-31 MED ORDER — SEMAGLUTIDE-WEIGHT MANAGEMENT 2.4 MG/0.75ML ~~LOC~~ SOAJ
2.4000 mg | SUBCUTANEOUS | 1 refills | Status: DC
Start: 2021-07-31 — End: 2021-10-06

## 2021-07-31 NOTE — Progress Notes (Signed)
Phone [phone]   Subjective:  Patient presents today for their annual physical. Chief complaint-noted.   See problem oriented charting- ROS- full  review of systems was completed and negative except for: epigastric pain that can wake her up from sleep. Not exertional.   The following were reviewed and entered/updated in epic: Past Medical History:  Diagnosis Date   Allergy    Anemia, iron deficiency    B12 deficiency    CAP (community acquired pneumonia) 11/18/2014   GERD (gastroesophageal reflux disease)    Heartburn    Iron deficiency    Osteoarthritis    Osteopenia    Solitary pulmonary nodule 12/05/2014   Dx 11/19/14  3 mm > f/u 11/27/2015 > resolved, no further studies rec     Tibial plateau fracture    june 2020- murphy/wainer cared for her   Urinary tract infection    Patient Active Problem List   Diagnosis Date Noted   Recurrent UTI 04/07/2016    Priority: High   Anxiety 12/13/2018    Priority: Medium   Hyperlipidemia 05/20/2017    Priority: Medium   History of adenomatous polyp of colon 10/12/2016    Priority: Medium   Rosacea 08/30/2014    Priority: Medium   Insomnia 08/30/2014    Priority: Medium   Bariatric surgery status 09/01/2010    Priority: Medium   DETRUSOR, 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    Priority: Medium   ANEMIA, B12 DEFICIENCY 08/15/2007    Priority: Medium   Plantar fasciitis, right 12/13/2014    Priority: Low   Solitary pulmonary nodule 12/05/2014    Priority: Low   CAP (community acquired pneumonia) 11/18/2014    Priority: Low   Family history of hypertrophic cardiomyopathy 05/13/2011    Priority: Low   Actinic keratosis 10/22/2010    Priority: Low   CHRONIC 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 W/O SM 08/23/2008    Priority: Low   ALLERGIC RHINITIS 03/01/2008    Priority: Low   ANEMIA-IRON DEFICIENCY 11/21/2007    Priority: Low   GERD 06/02/2007    Priority: Low   PSORIASIS 06/02/2007    Priority: Low   OSTEOARTHRITIS 06/02/2007     Priority: Low   OSTEOPENIA 06/02/2007    Priority: Low   Past Surgical History:  Procedure Laterality Date   carpal tunnel right     COLONOSCOPY     COSMETIC SURGERY     tummy tuck   GASTRIC BYPASS  2001   POLYPECTOMY      Family History  Problem Relation Age of Onset   Heart disease Father    Lung cancer Mother        stage IV, smoker   Heart disease Sister        Hypertrophic cardiomyopathy   Uterine cancer Sister    Colon cancer Neg Hx    Esophageal cancer Neg Hx    Rectal cancer Neg Hx    Stomach cancer Neg Hx    Colon polyps Neg Hx     Medications- reviewed and updated Current Outpatient Medications  Medication Sig Dispense Refill   calcium carbonate 200 MG capsule Take 3,000 mg by mouth daily.     cyanocobalamin (,VITAMIN B-12,) 1000 MCG/ML injection INJECT 1ML (1000MCG TOTAL) INTO THE MUSCLE EVERY 30 DAYS. 3 mL 3   estradiol (ESTRACE) 0.1 MG/GM vaginal cream Place 0.5 g vaginally 2 (two) times a week. Place 0.5g nightly for two weeks then twice a week after 30 g 11   estradiol (ESTRACE) 2 MG  tablet Take 2 mg by mouth daily.     fluticasone (FLONASE) 50 MCG/ACT nasal spray USE 2 SPRAYS IN EACH NOSTRIL ONCE DAILY 16 g 1   Multiple Vitamin (MULTIVITAMIN) tablet Take 1 tablet by mouth daily.     omeprazole (PRILOSEC) 20 MG capsule TAKE ONE CAPSULE BY MOUTH DAILY 90 capsule 3   OVER THE COUNTER MEDICATION Vitamin D 3 one capsule daily.     progesterone (PROMETRIUM) 200 MG capsule TAKE ONE CAPSULE BY MOUTH DAILY AT BEDTIME.     sulfamethoxazole-trimethoprim (BACTRIM) 400-80 MG tablet Take 1 tablet by mouth daily. 30 tablet 5   temazepam (RESTORIL) 15 MG capsule TAKE ONE CAPSULE ('15MG'$  TOTAL) BY MOUTH AT BEDTIME AS NEEDED FOR SLEEP 30 capsule 5   Trospium Chloride 60 MG CP24 Take 1 capsule (60 mg total) by mouth daily. 30 capsule 5   valACYclovir (VALTREX) 1000 MG tablet Take 2 pills twice a day for 1 day at first sign of cold sore 30 tablet 1   No current  facility-administered medications for this visit.    Allergies-reviewed and updated No Known Allergies  Social History   Social History Narrative   Married to [employer]. 2 kids. Jenny Reichmann (his wife had TBI being struck by car) with daughter [name]. Patient takes care of [name] and son lives with her and husband) and Colletta [place] lives in [place] with no kids.       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  Raising granddaughter after daughter has TBI.       Hobbies: spending time with friends and family          Objective  Objective:  BP 116/76   Pulse 78   Temp 97.7 F (36.5 C) (Temporal)   Ht 5' 7.5" (1.715 m)   Wt 186 lb (84.4 kg)   SpO2 99%   BMI 28.70 kg/m  Gen: NAD, resting comfortably HEENT: Mucous membranes are moist. Oropharynx normal Neck: no thyromegaly CV: RRR no murmurs rubs or gallops Lungs: CTAB no crackles, wheeze, rhonchi Abdomen: soft/nontender/nondistended/normal bowel sounds. No rebound or guarding.  Ext: no edema Skin: warm, dry Neuro: grossly normal, moves all extremities, PERRLA   Assessment and Plan   65 y.o. female presenting for annual physical.  Health Maintenance counseling: 1. Anticipatory guidance: Patient counseled regarding regular dental exams -q6 months, eye exams -yearly,  avoiding smoking and second hand smoke , limiting alcohol to 1 beverage per day-less than once a month .   2. Risk factor reduction:  Advised patient of need for regular exercise and diet rich and fruits and vegetables to reduce risk of heart attack and stroke. Exercise and diet-see discussion below about obesity now overweight Wt Readings from Last 3 Encounters:  07/31/21 186 lb (84.4 kg)  04/04/21 210 lb (95.3 kg)  02/24/21 210 lb (95.3 kg)  3. Immunizations/screenings/ancillary studies- discussed Shingrix-still need to get date of 2nd and Flu vaccination-recommended in  fall - otherwise up-to-date. Immunization History  Administered Date(s) Administered   Fluad Quad(high Dose 65+) 08/04/2019, 09/09/2020   Influenza Split 10/03/2012   Influenza Whole 09/06/2001, 10/12/2007, 09/25/2008, 09/01/2010   Influenza,inj,Quad PF,6+ Mos 08/28/2013, 08/30/2014   Influenza-Unspecified 09/21/2015, 08/07/2016, 08/31/2018   PFIZER(Purple Top)SARS-COV-2 Vaccination 12/26/2019, 01/16/2020, 07/31/2020   Pneumococcal Polysaccharide-23 08/04/2019   Td 06/07/2003   Tdap 12/10/2015   Zoster Recombinat (Shingrix) 09/25/2019  4. Cervical cancer screening- She still sees GYN- Planned  for D+C due to bleeding issues/spotting in the past 5. Breast cancer screening-  breast exam done with GYN and mammogram -e last done 01/17/2021 6. Colon cancer screening - last done 08/22/2020 with a 7-year repeat planned 7. Skin cancer screening-[place] dermatology Dr. Martin Majestic. advised regular sunscreen use. Denies worrisome, changing, or new skin lesions.  8. Birth control/STD check- postmenopausal and monogomous 9. Osteoporosis screening at 13- Last DEXA scan 11/13/2015 with GYN -Former smoker-quit in 1970s.  No regular screenings  Status of chronic or acute concerns   #social update- had great 2 week trip to [place]  # Obesity--> now overweight.   S:medication: Wegovy injections  up to 2.4 per week on week for 28 days  Summary from last visit Patient was struggling with weight management in the past - the lowest she has been was 165 an that was after bypass surgery. - Healthy Weight to Wellness program was recommended to patient in the past but she declined this option - She opted to want to try a combo of exercise , weight watchers, and wegovy - I thought this was reasonable to try - minimum weight loss goal of 11 lbs was set for 3-6 months   Exercise and diet: She was going to the gym 2-3x a week. Patient was also doing weight watchers - diet was not bad but she just wanted to eat more  frequently and this made it hard to be compliant with the diets   Today: Patient down 24 lbs! Goal was to at least lose 11 lbs within 6 months so she has surpassed this from march visit. Similar to weight from 2001 after bypass per her.  -still doing 2-3 days a week - helping with appetite substantially- does get side effect of some change in taste which makes food not as appetizing.  A/P: doing well-  continue current meds at least for 3-6 months and reevaluate. Refill today.   #Vitamin D deficiency S: Medication: was on 50k units in the pat -she is on 3000 units per day Last vitamin D Lab Results  Component Value Date   VD25OH 27 (L) 11/25/2020  A/P: hopefully improved- update vitamin D . Did encourage bariatric MV as well- more consistent with this thankfully   # Insomnia S:medication: temazepam 15 mg as needed A/P: reasonable control- continue current rx.    # GERD S:Medication:  omeprazole 20 mg daily most days A/P: doing well- continue current meds   # B12 deficiency and patient with history of gastric bypass S: Current treatment/medication (oral vs. IM):  1000 mcg/mL injection monthly Lab Results  Component Value Date   VITAMINB12 430 12/09/2020  A/P: hopefully stable- update b12 today. Continue current meds for now   #hyperlipidemia S: Medication:none. 10 year ascvd risk of 5%  -sister with HOCM- patient does not have gene, dad with CHF Lab Results  Component Value Date   CHOL 166 08/04/2019   HDL 66.60 08/04/2019   LDLCALC 85 08/04/2019   LDLDIRECT 84.0 12/10/2015   TRIG 73.0 08/04/2019   CHOLHDL 2 08/04/2019   A/P: may have improved with weight loss- update lipids and recalculate.  -does get some epigastric abdominal pain that wakes her up at night (posisble reflux) No exertional chest pain with exercise. She is going to track to see if food related or on days she misses omeprazole- we discussed possible coronary calcium score but with risk udner 7.5% wants to  hold off for now  #chronic bactrim and trospium working well  #  cold sores- doing well recently- no refill needed  Recommended follow up: Return in about 6 months (around 01/31/2022) for a follow-up or sooner if needed. Future Appointments  Date Time Provider [place]  08/15/2021  8:20 AM Jaquita Folds, MD [facility]   Lab/Order associations:NOT fasting   ICD-10-CM   1. Preventative health care  Z00.00     2. Gastroesophageal reflux disease, unspecified whether esophagitis present  K21.9     3. ANEMIA, B12 DEFICIENCY  D51.8       No orders of the defined types were placed in this encounter.  I,Harris Phan,acting as a Education administrator for [name], MD.,have documented all relevant documentation on the behalf of [name], MD,as directed by  [name], MD while in the presence of [name], MD.  I, [name], MD, have reviewed all documentation for this visit. The documentation on 07/31/21 for the exam, diagnosis, procedures, and orders are all accurate and complete.   Return precautions advised.  [name], MD

## 2021-07-31 NOTE — Patient Instructions (Addendum)
Health Maintenance Due  Topic Date Due   Zoster Vaccines- Shingrix (2 of 2) Patient has had both of these vaccines done. She is going to have [facility] fax [place] over a copy of her vaccines.  11/20/2019   INFLUENZA VACCINE Patient will consider getting this immunization in the future once it's available.  Please consider getting your flu shot in the Fall. If you get this outside of our office, please let us know.  07/07/2021   Please stop by lab before you go If you have mychart- we will send your results within 3 business days of [place] receiving them.  If you do not have mychart- we will call you about results within 5 business days of [place] receiving them.  *please also note that you will see labs on mychart as soon as they post. I will later go in and write notes on them- will say "notes from Dr. Yong Channel"  I am thrilled to see that you are doing so well on your weight loss - since you are not having any issues taking wegovy  I do think it is fine to continue this medication. Keep up the great work!  Recommended follow up: Return in about 6 months (around 01/31/2022) for a follow-up or sooner if needed.

## 2021-08-01 ENCOUNTER — Encounter: Payer: Self-pay | Admitting: Family Medicine

## 2021-08-01 LAB — CBC WITH DIFFERENTIAL/PLATELET
Basophils Absolute: 0.1 10*3/uL (ref 0.0–0.1)
Basophils Relative: 1.1 % (ref 0.0–3.0)
Eosinophils Absolute: 0.1 10*3/uL (ref 0.0–0.7)
Eosinophils Relative: 3.3 % (ref 0.0–5.0)
HCT: 37.8 % (ref 36.0–46.0)
Hemoglobin: 12.6 g/dL (ref 12.0–15.0)
Lymphocytes Relative: 36.1 % (ref 12.0–46.0)
Lymphs Abs: 1.6 10*3/uL (ref 0.7–4.0)
MCHC: 33.4 g/dL (ref 30.0–36.0)
MCV: 92.4 fl (ref 78.0–100.0)
Monocytes Absolute: 0.2 10*3/uL (ref 0.1–1.0)
Monocytes Relative: 5.5 % (ref 3.0–12.0)
Neutro Abs: 2.4 10*3/uL (ref 1.4–7.7)
Neutrophils Relative %: 54 % (ref 43.0–77.0)
Platelets: 204 10*3/uL (ref 150.0–400.0)
RBC: 4.09 Mil/uL (ref 3.87–5.11)
RDW: 12.5 % (ref 11.5–15.5)
WBC: 4.5 10*3/uL (ref 4.0–10.5)

## 2021-08-01 LAB — LIPID PANEL
Cholesterol: 168 mg/dL (ref 0–200)
HDL: 57.9 mg/dL (ref >39.00)
LDL Cholesterol: 95 mg/dL (ref 0–99)
NonHDL: 109.66
Total CHOL/HDL Ratio: 3
Triglycerides: 74 mg/dL (ref 0.0–149.0)
VLDL: 14.8 mg/dL (ref 0.0–40.0)

## 2021-08-01 LAB — COMPREHENSIVE METABOLIC PANEL
ALT: 12 U/L (ref 0–35)
AST: 19 U/L (ref 0–37)
Albumin: 3.9 g/dL (ref 3.5–5.2)
Alkaline Phosphatase: 70 U/L (ref 39–117)
BUN: 14 mg/dL (ref 6–23)
CO2: 26 mEq/L (ref 19–32)
Calcium: 9.2 mg/dL (ref 8.4–10.5)
Chloride: 102 mEq/L (ref 96–112)
Creatinine, Ser: 0.78 mg/dL (ref 0.40–1.20)
GFR: 78.51 mL/min (ref >60.00)
Glucose, Bld: 66 mg/dL — ABNORMAL LOW (ref 70–99)
Potassium: 4.1 mEq/L (ref 3.5–5.1)
Sodium: 137 mEq/L (ref 135–145)
Total Bilirubin: 0.5 mg/dL (ref 0.2–1.2)
Total Protein: 6.8 g/dL (ref 6.0–8.3)

## 2021-08-01 LAB — VITAMIN B12: Vitamin B-12: 364 pg/mL (ref 211–911)

## 2021-08-01 LAB — VITAMIN D 25 HYDROXY (VIT D DEFICIENCY, FRACTURES): VITD: 31.46 ng/mL (ref 30.00–100.00)

## 2021-08-01 NOTE — Telephone Encounter (Signed)
See report

## 2021-08-04 NOTE — Telephone Encounter (Signed)
Bone Density abstracted.

## 2021-08-15 ENCOUNTER — Ambulatory Visit: Payer: Medicare PPO | Admitting: Obstetrics and Gynecology

## 2021-08-15 ENCOUNTER — Other Ambulatory Visit: Payer: Self-pay | Admitting: Obstetrics and Gynecology

## 2021-08-15 DIAGNOSIS — N390 Urinary tract infection, site not specified: Secondary | ICD-10-CM

## 2021-08-19 ENCOUNTER — Encounter: Payer: Self-pay | Admitting: Family Medicine

## 2021-08-22 ENCOUNTER — Other Ambulatory Visit: Payer: Self-pay

## 2021-08-22 ENCOUNTER — Encounter: Payer: Self-pay | Admitting: Obstetrics and Gynecology

## 2021-08-22 ENCOUNTER — Ambulatory Visit (INDEPENDENT_AMBULATORY_CARE_PROVIDER_SITE_OTHER): Payer: Medicare PPO | Admitting: Obstetrics and Gynecology

## 2021-08-22 VITALS — BP 116/75 | HR 81

## 2021-08-22 DIAGNOSIS — N390 Urinary tract infection, site not specified: Secondary | ICD-10-CM

## 2021-08-22 DIAGNOSIS — N3281 Overactive bladder: Secondary | ICD-10-CM | POA: Diagnosis not present

## 2021-08-22 MED ORDER — TROSPIUM CHLORIDE ER 60 MG PO CP24: 1.0000 | ORAL_CAPSULE | Freq: Every day | ORAL | 11 refills | Status: DC

## 2021-08-22 NOTE — Progress Notes (Signed)
[name] Return Visit  SUBJECTIVE  History of Present Illness: Alexis Wallace is a 65 y.o. female seen in follow-up for recurrent UTI and OAB Plan at last visit was to continue bactrim prophylaxis and also start Trospium '60mg'$  daily.    No urgency or leakage. Not waking up at night to urinate. Urine stream is slower but able to empty well. Not had any UTIs since April.   Past Medical History: Patient  has a past medical history of Allergy, Anemia, iron deficiency, B12 deficiency, CAP (community acquired pneumonia) (11/18/2014), GERD (gastroesophageal reflux disease), Heartburn, Iron deficiency, Osteoarthritis, Osteopenia, Solitary pulmonary nodule (12/05/2014), Tibial plateau fracture, and Urinary tract infection.   Past Surgical History: She  has a past surgical history that includes Gastric bypass (2001); Cosmetic surgery; carpal tunnel right; Colonoscopy; and Polypectomy.   Medications: She has a current medication list which includes the following prescription(s): calcium carbonate, cyanocobalamin, estradiol, estradiol, fluticasone, multivitamin, omeprazole, OVER THE COUNTER MEDICATION, progesterone, semaglutide-weight management, sulfamethoxazole-trimethoprim, temazepam, valacyclovir, and trospium chloride.   Allergies: Patient has No Known Allergies.   Social History: Patient  reports that she quit smoking about 43 years ago. Her smoking use included cigarettes. She has a 5.00 pack-year smoking history. She has never used smokeless tobacco. She reports that she does not drink alcohol and does not use drugs.      OBJECTIVE     Physical Exam: Vitals:   08/22/21 1141  BP: 116/75  Pulse: 81   Gen: No apparent distress, A&O x 3.  Detailed Urogynecologic Evaluation:  Deferred.    ASSESSMENT AND PLAN    Alexis Wallace is a 65 y.o. with:  1. Recurrent urinary tract infection   2. Overactive bladder    - Has been on Bactrim prophylaxis for 6 months and no  current symptoms. Will stop antibiotics after she finishes out the month.  - She would also like to start weaning off the trospium. She will start taking it every other day to see if her symptoms have changed, then gradually decrease.   Return 2 months to reevaluate.   Jaquita Folds, MD  Time spent: I spent 15 minutes dedicated to the care of this patient on the date of this encounter to include pre-visit review of records, face-to-face time with the patient and post visit documentation and ordering medication/ testing.

## 2021-09-06 ENCOUNTER — Encounter: Payer: Self-pay | Admitting: Emergency Medicine

## 2021-09-06 ENCOUNTER — Ambulatory Visit
Admission: EM | Admit: 2021-09-06 | Discharge: 2021-09-06 | Disposition: A | Discharge status: Home / Self Care | Payer: Medicare PPO | Attending: Family Medicine | Admitting: Family Medicine

## 2021-09-06 DIAGNOSIS — Z1152 Encounter for screening for COVID-19: Secondary | ICD-10-CM

## 2021-09-06 DIAGNOSIS — F419 Anxiety disorder, unspecified: Secondary | ICD-10-CM

## 2021-09-06 DIAGNOSIS — Z20822 Contact with and (suspected) exposure to covid-19: Secondary | ICD-10-CM

## 2021-09-06 DIAGNOSIS — J069 Acute upper respiratory infection, unspecified: Secondary | ICD-10-CM

## 2021-09-06 MED ORDER — PREDNISONE 20 MG PO TABS: 20.0000 mg | ORAL_TABLET | Freq: Every day | ORAL | 0 refills | Status: AC

## 2021-09-06 MED ORDER — PROMETHAZINE-DM 6.25-15 MG/5ML PO SYRP: 5.0000 mL | ORAL_SOLUTION | Freq: Four times a day (QID) | ORAL | 0 refills | Status: DC | PRN

## 2021-09-06 NOTE — ED Triage Notes (Signed)
Pt presents today with c/o nasal congestion, cough, headache and neck pain that began on Tuesday. Two home Covid tests = neg. She requests to be tested for Covid today. Denies fever.

## 2021-09-06 NOTE — Discharge Instructions (Addendum)
Your COVID 19 results should result within 2-3 days. Negative results are immediately resulted to Mychart. Positive results will receive a follow-up call from our clinic. If symptoms are present, I recommend home quarantine until results are known.  Drink plenty of fluids. Increase intake of food as tolerated. Alternate Tylenol and ibuprofen as needed for body aches and fever.  Symptom management per recommendations discussed today.  If any breathing difficulty or chest pain develops go immediately to the closest emergency department for evaluation.

## 2021-09-06 NOTE — ED Provider Notes (Signed)
[facility]    CSN: [id number] Arrival date & time: 09/06/21  0809      History   Chief Complaint No chief complaint on file.   HPI Alexis Wallace is a 65 y.o. female.   HPI Patient presents today with 5 days of cough, headache, nasal congestion, fatigue and neck pain. Requests COVID testing. No known exposure to any sick with COVID or Flu.  Afebrile. Denies SOB, chest pain, or N&V.  Past Medical History:  Diagnosis Date   Allergy    Anemia, iron deficiency    B12 deficiency    CAP (community acquired pneumonia) 11/18/2014   GERD (gastroesophageal reflux disease)    Heartburn    Iron deficiency    Osteoarthritis    Osteopenia    Solitary pulmonary nodule 12/05/2014   Dx 11/19/14  3 mm > f/u 11/27/2015 > resolved, no further studies rec     Tibial plateau fracture    june 2020- murphy/wainer cared for her   Urinary tract infection     Patient Active Problem List   Diagnosis Date Noted   Anxiety 12/13/2018   Hyperlipidemia 05/20/2017   History of adenomatous polyp of colon 10/12/2016   Recurrent UTI 04/07/2016   Plantar fasciitis, right 12/13/2014   Solitary pulmonary nodule 12/05/2014   CAP (community acquired pneumonia) 11/18/2014   Rosacea 08/30/2014   Insomnia 08/30/2014   Family history of hypertrophic cardiomyopathy 05/13/2011   Actinic keratosis 10/22/2010   Bariatric surgery status 09/01/2010   DETRUSOR, 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   CHRONIC 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 W/O SM 08/23/2008   ALLERGIC RHINITIS 03/01/2008   ANEMIA-IRON DEFICIENCY 11/21/2007   ANEMIA, B12 DEFICIENCY 08/15/2007   GERD 06/02/2007   PSORIASIS 06/02/2007   OSTEOARTHRITIS 06/02/2007   OSTEOPENIA 06/02/2007    Past Surgical History:  Procedure Laterality Date   carpal tunnel right     COLONOSCOPY     COSMETIC SURGERY     tummy tuck   GASTRIC BYPASS  2001   POLYPECTOMY      OB History     Gravida  2   Para  2   Term  2   Preterm      AB       Living  2      SAB      IAB      Ectopic      Multiple      Live Births  2            Home Medications    Prior to Admission medications   Medication Sig Start Date End Date Taking? Authorizing Provider  predniSONE (DELTASONE) 20 MG tablet Take 1 tablet (20 mg total) by mouth daily with breakfast for 5 days. 09/06/21 09/11/21 Yes Scot Jun, FNP  promethazine-dextromethorphan (PROMETHAZINE-DM) 6.25-15 MG/5ML syrup Take 5 mLs by mouth 4 (four) times daily as needed for cough. 09/06/21  Yes Scot Jun, FNP  calcium carbonate 200 MG capsule Take 3,000 mg by mouth daily.    [provider]  cyanocobalamin (,VITAMIN B-12,) 1000 MCG/ML injection INJECT 1ML (1000MCG TOTAL) INTO THE MUSCLE EVERY 30 DAYS. 07/31/21   Marin Olp, MD  estradiol (ESTRACE) 0.1 MG/GM vaginal cream Place 0.5 g vaginally 2 (two) times a week. Place 0.5g nightly for two weeks then twice a week after 12/16/20   Jaquita Folds, MD  estradiol (ESTRACE) 2 MG tablet Take 2 mg by mouth daily. 12/09/20   [provider]  fluticasone (FLONASE) 50 MCG/ACT nasal spray USE 2 SPRAYS IN EACH NOSTRIL ONCE DAILY 01/29/20   Marin Olp, MD  Multiple Vitamin (MULTIVITAMIN) tablet Take 1 tablet by mouth daily.    [provider]  omeprazole (PRILOSEC) 20 MG capsule TAKE ONE CAPSULE BY MOUTH DAILY 04/17/21   Marin Olp, MD  OVER THE COUNTER MEDICATION Vitamin D 3 one capsule daily.    [provider]  progesterone (PROMETRIUM) 200 MG capsule TAKE ONE CAPSULE BY MOUTH DAILY AT BEDTIME. 12/09/20   [provider]  sulfamethoxazole-trimethoprim (BACTRIM) 400-80 MG tablet Take 1 tablet by mouth daily. 02/18/21   Jaquita Folds, MD  temazepam (RESTORIL) 15 MG capsule TAKE ONE CAPSULE (15MG  TOTAL) BY MOUTH AT BEDTIME AS NEEDED FOR SLEEP 05/14/21   Marin Olp, MD  Trospium Chloride 60 MG CP24 Take 1 capsule (60 mg total) by mouth daily. 08/22/21    Jaquita Folds, MD  valACYclovir (VALTREX) 1000 MG tablet Take 2 pills twice a day for 1 day at first sign of cold sore 08/29/19   Marin Olp, MD    Family History Family History  Problem Relation Age of Onset   Heart disease Father    Lung cancer Mother        stage IV, smoker   Heart disease Sister        Hypertrophic cardiomyopathy   Uterine cancer Sister    Colon cancer Neg Hx    Esophageal cancer Neg Hx    Rectal cancer Neg Hx    Stomach cancer Neg Hx    Colon polyps Neg Hx     Social History Social History   Tobacco Use   Smoking status: Former    Packs/day: 1.00    Years: 5.00    Pack years: 5.00    Types: Cigarettes    Quit date: 02/10/1978    Years since quitting: 43.6   Smokeless tobacco: Never  Vaping Use   Vaping Use: Never used  Substance Use Topics   Alcohol use: No   Drug use: No     Allergies   Patient has no known allergies.   Review of Systems Review of Systems Pertinent negatives listed in HPI   Physical Exam Triage Vital Signs ED Triage Vitals  Enc Vitals Group     BP 09/06/21 0824 (!) 155/93     Pulse Rate 09/06/21 0824 (!) 102     Resp 09/06/21 0824 18     Temp 09/06/21 0824 98.4 F (36.9 C)     Temp Source 09/06/21 0824 Oral     SpO2 09/06/21 0824 98 %     Weight --      Height --      Head Circumference --      Peak Flow --      Pain Score 09/06/21 0825 1     Pain Loc --      Pain Edu? --      Excl. in [place]? --    No data found.  Updated Vital Signs BP (!) 155/93 (BP Location: Right Arm)   Pulse (!) 102   Temp 98.4 F (36.9 C) (Oral)   Resp 18   SpO2 98%   Visual Acuity Right Eye Distance:   Left Eye Distance:   Bilateral Distance:    Right Eye Near:   Left Eye Near:    Bilateral Near:     Physical Exam   General Appearance:    Alert, cooperative,  no distress  HENT:   Normocephalic, ears normal, nares mucosal edema with congestion, rhinorrhea, oropharynx    Eyes:    PERRL, conjunctiva/corneas  clear, EOM's intact       Lungs:     Clear to auscultation bilaterally, respirations unlabored  Heart:    Regular rate and rhythm  Neurologic:   Awake, alert, oriented x 3. No apparent focal neurological           defect.     UC Treatments / Results  Labs (all labs ordered are listed, but only abnormal results are displayed) Labs Reviewed  COVID-19, FLU A+B NAA    EKG   Radiology No results found.  Procedures Procedures (including critical care time)  Medications Ordered in UC Medications - No data to display  Initial Impression / Assessment and Plan / UC Course  I have reviewed the triage vital signs and the nursing notes.  Pertinent labs & imaging results that were available during my care of the patient were reviewed by me and considered in my medical decision making (see chart for details).     COVID/Flu test pending. Symptom management warranted only.  Manage fever with Tylenol and ibuprofen.  Nasal symptoms with over-the-counter antihistamines recommended.  Treatment per discharge medications/discharge instructions.  Red flags/ER precautions given. The most current CDC isolation/quarantine recommendation advised.   Final Clinical Impressions(s) / UC Diagnoses   Final diagnoses:  Viral URI with cough  Encounter for screening for COVID-19     Discharge Instructions      Your COVID 19 results should result within 2-3 days. Negative results are immediately resulted to Mychart. Positive results will receive a follow-up call from our clinic. If symptoms are present, I recommend home quarantine until results are known.  Drink plenty of fluids. Increase intake of food as tolerated. Alternate Tylenol and ibuprofen as needed for body aches and fever.  Symptom management per recommendations discussed today.  If any breathing difficulty or chest pain develops go immediately to the closest emergency department for evaluation.      ED Prescriptions     Medication Sig  Dispense Auth. Provider   predniSONE (DELTASONE) 20 MG tablet Take 1 tablet (20 mg total) by mouth daily with breakfast for 5 days. 5 tablet Scot Jun, FNP   promethazine-dextromethorphan (PROMETHAZINE-DM) 6.25-15 MG/5ML syrup Take 5 mLs by mouth 4 (four) times daily as needed for cough. 140 mL Scot Jun, FNP      PDMP not reviewed this encounter.   Scot Jun, [place] 09/06/21 [phone]

## 2021-09-07 LAB — COVID-19, FLU A+B NAA
Influenza A, NAA: NOT DETECTED
Influenza B, NAA: NOT DETECTED
SARS-CoV-2, NAA: NOT DETECTED

## 2021-09-11 ENCOUNTER — Encounter: Payer: Self-pay | Admitting: Family Medicine

## 2021-09-11 ENCOUNTER — Other Ambulatory Visit: Payer: Self-pay

## 2021-09-11 ENCOUNTER — Telehealth (INDEPENDENT_AMBULATORY_CARE_PROVIDER_SITE_OTHER): Payer: Medicare PPO | Admitting: Family Medicine

## 2021-09-11 DIAGNOSIS — B9689 Other specified bacterial agents as the cause of diseases classified elsewhere: Secondary | ICD-10-CM

## 2021-09-11 DIAGNOSIS — J329 Chronic sinusitis, unspecified: Secondary | ICD-10-CM | POA: Diagnosis not present

## 2021-09-11 MED ORDER — AMOXICILLIN-POT CLAVULANATE 875-125 MG PO TABS: 1.0000 | ORAL_TABLET | Freq: Two times a day (BID) | ORAL | 0 refills | Status: AC

## 2021-09-11 NOTE — Telephone Encounter (Signed)
Patient is scheduled 10/7 at 1:40 she has had neg covid PCR test

## 2021-09-11 NOTE — Progress Notes (Signed)
Phone [phone] Virtual visit via Video note   Subjective:  Chief complaint: Chief Complaint  Patient presents with   Sinusitis    Seen at UC on 10/1, given prednisone and promethazine for URI. Last dose of prednisone yesterday. States that she is still experiencing sinus pressure and production of yellow mucus.    This visit type was conducted due to national recommendations for restrictions regarding the COVID-19 Pandemic (e.g. social distancing).  This format is felt to be most appropriate for this patient at this time balancing risks to patient and risks to population by having him in for in person visit.  No physical exam was performed (except for noted visual exam or audio findings with Telehealth visits).    Our team/I connected with Clayborne Artist at  4:00 PM EDT by a video enabled telemedicine application ([email or web address] or caregility through epic) and verified that I am speaking with the correct person using two identifiers.  Location patient: [facility] Location provider: [facility], office Persons participating in the virtual visit:  patient  Our team/I discussed the limitations of evaluation and management by telemedicine and the availability of in person appointments. In light of current covid-19 pandemic, patient also understands that we are trying to protect them by minimizing in office contact if at all possible.  The patient expressed consent for telemedicine visit and agreed to proceed. Patient understands insurance will be billed.   Past Medical History-  Patient Active Problem List   Diagnosis Date Noted   Recurrent UTI 04/07/2016    Priority: 1.   Anxiety 12/13/2018    Priority: 2.   Hyperlipidemia 05/20/2017    Priority: 2.   History of adenomatous polyp of colon 10/12/2016    Priority: 2.   Rosacea 08/30/2014    Priority: 2.   Insomnia 08/30/2014    Priority: 2.   Bariatric surgery status 09/01/2010    Priority: 2.   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   ANEMIA, B12 DEFICIENCY 08/15/2007    Priority: 2.   Plantar fasciitis, right 12/13/2014    Priority: 3.   Solitary pulmonary nodule 12/05/2014    Priority: 3.   CAP (community acquired pneumonia) 11/18/2014    Priority: 3.   Family history of hypertrophic cardiomyopathy 05/13/2011    Priority: 3.   Actinic keratosis 10/22/2010    Priority: 3.   CHRONIC 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 W/O SM 08/23/2008    Priority: 3.   ALLERGIC RHINITIS 03/01/2008    Priority: 3.   ANEMIA-IRON DEFICIENCY 11/21/2007    Priority: 3.   GERD 06/02/2007    Priority: 3.   PSORIASIS 06/02/2007    Priority: 3.   OSTEOARTHRITIS 06/02/2007    Priority: 3.   OSTEOPENIA 06/02/2007    Priority: 3.    Medications- reviewed and updated Current Outpatient Medications  Medication Sig Dispense Refill   amoxicillin-clavulanate (AUGMENTIN) 875-125 MG tablet Take 1 tablet by mouth 2 (two) times daily for 7 days. 14 tablet 0   calcium carbonate 200 MG capsule Take 3,000 mg by mouth daily.     cyanocobalamin (,VITAMIN B-12,) 1000 MCG/ML injection INJECT 1ML (1000MCG TOTAL) INTO THE MUSCLE EVERY 30 DAYS. 3 mL 3   estradiol (ESTRACE) 0.1 MG/GM vaginal cream Place 0.5 g vaginally 2 (two) times a week. Place 0.5g nightly for two weeks then twice a week after 30 g 11   estradiol (ESTRACE) 2 MG tablet Take 2 mg by mouth daily.     fluticasone (  FLONASE) 50 MCG/ACT nasal spray USE 2 SPRAYS IN EACH NOSTRIL ONCE DAILY 16 g 1   Multiple Vitamin (MULTIVITAMIN) tablet Take 1 tablet by mouth daily.     omeprazole (PRILOSEC) 20 MG capsule TAKE ONE CAPSULE BY MOUTH DAILY 90 capsule 3   OVER THE COUNTER MEDICATION Vitamin D 3 one capsule daily.     progesterone (PROMETRIUM) 200 MG capsule TAKE ONE CAPSULE BY MOUTH DAILY AT BEDTIME.     promethazine-dextromethorphan (PROMETHAZINE-DM) 6.25-15 MG/5ML syrup Take 5 mLs by mouth 4 (four) times daily as needed for cough. 140 mL 0   temazepam (RESTORIL) 15 MG capsule  TAKE ONE CAPSULE (15MG  TOTAL) BY MOUTH AT BEDTIME AS NEEDED FOR SLEEP 30 capsule 5   Trospium Chloride 60 MG CP24 Take 1 capsule (60 mg total) by mouth daily. 30 capsule 11   valACYclovir (VALTREX) 1000 MG tablet Take 2 pills twice a day for 1 day at first sign of cold sore 30 tablet 1   predniSONE (DELTASONE) 20 MG tablet Take 1 tablet (20 mg total) by mouth daily with breakfast for 5 days. (Patient not taking: Reported on 09/11/2021) 5 tablet 0   sulfamethoxazole-trimethoprim (BACTRIM) 400-80 MG tablet Take 1 tablet by mouth daily. (Patient not taking: Reported on 09/11/2021) 30 tablet 5   No current facility-administered medications for this visit.     Objective:  no self reported vitals Gen: NAD, resting comfortably Lungs: nonlabored, normal respiratory rate  Skin: appears dry, no obvious rash     Assessment and Plan  # Sinus Infection/Bacterial Sinusitis concerning S: Patient was seen in the regional urgent care on 09/06/2021 and presented with at least 5 days of cough, headache, nasal congestion, fatigue and neck pain. She also requested COVID testing - no known exposure to anyone sick with COVID or flu.  COVID/Flu test came back negative. Patient was instructed to manage fever with Tylenol and ibuprofen. Nasal symptoms were to be treated with over-the-counter antihistamines as recommended.  He was also given a course of prednisone.  Today patient reportsCough is improved overall on prednisone but not otherwise helpful-sinuses and head are not improving and in fact are worsening. Thick mucus- yellow mixed with blood. Started with congestion at onset- tried flonase with no improvement before going into the urgent care. No fever or chills. No shortness of breath.  A/P: Patient now with over 10 days of sinus pressure and congestion that seems to be worsening instead of improving despite course of prednisone from urgent care and other conservative measures.  I am concerned about bacterial  superinfection/sinusitis-treat with Augmentin for 7 days planned-she knows if she has new or worsening symptoms or fails to improve she will let us know.  No current symptoms to suggest progression to the chest.  Of note not currently taking her Bactrim for UTI prevention and has upcoming appointment with Dr. Wannetta Sender  Recommended follow up:  Future Appointments  Date Time Provider [place]  10/24/2021  9:20 AM Jaquita Folds, MD [facility]  01/26/2022  8:40 AM Marin Olp, MD [facility]   Lab/Order associations:   ICD-10-CM   1. Bacterial sinusitis  J32.9    B96.89       Meds ordered this encounter  Medications   amoxicillin-clavulanate (AUGMENTIN) 875-125 MG tablet    Sig: Take 1 tablet by mouth 2 (two) times daily for 7 days.    Dispense:  14 tablet    Refill:  0   Return precautions advised.  [name],  MD   

## 2021-09-11 NOTE — Patient Instructions (Signed)
Health Maintenance Due  Topic Date Due   Zoster Vaccines- Shingrix (2 of 2) - Please check with your pharmacy to see if they have the shingrix vaccine. If they do- please get this immunization and update [place] by phone call or mychart with dates you receive the vaccine.  11/20/2019   INFLUENZA VACCINE  - You may get this done at your local pharmacy when you improve.  07/07/2021   COVID-19 Vaccine (5 - Booster for [employer] series) - Recommended getting Omicron specific booster only at your local pharmacy! Please let us know when you have received this vaccination.  08/15/2021    Recommended follow up: No follow-ups on file.

## 2021-09-12 ENCOUNTER — Ambulatory Visit: Payer: Medicare PPO | Admitting: Family Medicine

## 2021-10-03 ENCOUNTER — Other Ambulatory Visit: Payer: Self-pay | Admitting: Family Medicine

## 2021-10-04 ENCOUNTER — Encounter: Payer: Self-pay | Admitting: Emergency Medicine

## 2021-10-04 ENCOUNTER — Ambulatory Visit
Admission: EM | Admit: 2021-10-04 | Discharge: 2021-10-04 | Disposition: A | Discharge status: Home / Self Care | Payer: Medicare PPO | Attending: Urgent Care | Admitting: Urgent Care

## 2021-10-04 ENCOUNTER — Other Ambulatory Visit: Payer: Self-pay

## 2021-10-04 DIAGNOSIS — N3001 Acute cystitis with hematuria: Secondary | ICD-10-CM | POA: Diagnosis not present

## 2021-10-04 DIAGNOSIS — R350 Frequency of micturition: Secondary | ICD-10-CM | POA: Insufficient documentation

## 2021-10-04 DIAGNOSIS — R300 Dysuria: Secondary | ICD-10-CM | POA: Diagnosis not present

## 2021-10-04 LAB — POCT URINALYSIS DIP (MANUAL ENTRY)
Glucose, UA: 250 mg/dL — AB
Nitrite, UA: POSITIVE — AB
Protein Ur, POC: >=300 mg/dL — AB
Spec Grav, UA: <=1.005 — AB (ref 1.010–1.025)
Urobilinogen, UA: >=8 E.U./dL — AB
pH, UA: 5 (ref 5.0–8.0)

## 2021-10-04 MED ORDER — CEPHALEXIN 500 MG PO CAPS: 500.0000 mg | ORAL_CAPSULE | Freq: Two times a day (BID) | ORAL | 0 refills | Status: DC

## 2021-10-04 NOTE — ED Triage Notes (Signed)
Urinary frequency pain on urination and has noticed some blood x 2 weeks.

## 2021-10-04 NOTE — Discharge Instructions (Addendum)

## 2021-10-04 NOTE — ED Triage Notes (Signed)
Patient has been taking AZO

## 2021-10-04 NOTE — ED Provider Notes (Signed)
[place]   MRN: [id number] DOB: 1956/02/08  Subjective:   Alexis Wallace is a 65 y.o. female presenting for 2-week history of persistent light hematuria, urinary frequency.  In the past few days she started to have more dysuria, urinary urgency.  Has a longstanding history of urinary tract infections and was previously on Bactrim but this was stopped by her urologist a while ago.  Patient tries to hydrate well.  Limits her urinary irritants.  She is taking Azo right now.  Denies fever, nausea, vomiting, flank tenderness, pelvic pain.  No current facility-administered medications for this encounter.  Current Outpatient Medications:    calcium carbonate 200 MG capsule, Take 3,000 mg by mouth daily., Disp: , Rfl:    cyanocobalamin (,VITAMIN B-12,) 1000 MCG/ML injection, INJECT 1ML (1000MCG TOTAL) INTO THE MUSCLE EVERY 30 DAYS., Disp: 3 mL, Rfl: 3   estradiol (ESTRACE) 0.1 MG/GM vaginal cream, Place 0.5 g vaginally 2 (two) times a week. Place 0.5g nightly for two weeks then twice a week after, Disp: 30 g, Rfl: 11   estradiol (ESTRACE) 2 MG tablet, Take 2 mg by mouth daily., Disp: , Rfl:    fluticasone (FLONASE) 50 MCG/ACT nasal spray, USE 2 SPRAYS IN EACH NOSTRIL ONCE DAILY, Disp: 16 g, Rfl: 1   Multiple Vitamin (MULTIVITAMIN) tablet, Take 1 tablet by mouth daily., Disp: , Rfl:    omeprazole (PRILOSEC) 20 MG capsule, TAKE ONE CAPSULE BY MOUTH DAILY, Disp: 90 capsule, Rfl: 3   OVER THE COUNTER MEDICATION, Vitamin D 3 one capsule daily., Disp: , Rfl:    progesterone (PROMETRIUM) 200 MG capsule, TAKE ONE CAPSULE BY MOUTH DAILY AT BEDTIME., Disp: , Rfl:    promethazine-dextromethorphan (PROMETHAZINE-DM) 6.25-15 MG/5ML syrup, Take 5 mLs by mouth 4 (four) times daily as needed for cough., Disp: 140 mL, Rfl: 0   sulfamethoxazole-trimethoprim (BACTRIM) 400-80 MG tablet, Take 1 tablet by mouth daily. (Patient not taking: Reported on 09/11/2021), Disp: 30 tablet, Rfl: 5   temazepam  (RESTORIL) 15 MG capsule, TAKE ONE CAPSULE (15MG  TOTAL) BY MOUTH AT BEDTIME AS NEEDED FOR SLEEP, Disp: 30 capsule, Rfl: 5   Trospium Chloride 60 MG CP24, Take 1 capsule (60 mg total) by mouth daily., Disp: 30 capsule, Rfl: 11   valACYclovir (VALTREX) 1000 MG tablet, Take 2 pills twice a day for 1 day at first sign of cold sore, Disp: 30 tablet, Rfl: 1   No Known Allergies  Past Medical History:  Diagnosis Date   Allergy    Anemia, iron deficiency    B12 deficiency    CAP (community acquired pneumonia) 11/18/2014   GERD (gastroesophageal reflux disease)    Heartburn    Iron deficiency    Osteoarthritis    Osteopenia    Solitary pulmonary nodule 12/05/2014   Dx 11/19/14  3 mm > f/u 11/27/2015 > resolved, no further studies rec     Tibial plateau fracture    june 2020- murphy/wainer cared for her   Urinary tract infection      Past Surgical History:  Procedure Laterality Date   carpal tunnel right     COLONOSCOPY     COSMETIC SURGERY     tummy tuck   GASTRIC BYPASS  2001   POLYPECTOMY      Family History  Problem Relation Age of Onset   Heart disease Father    Lung cancer Mother        stage IV, smoker   Heart disease Sister  Hypertrophic cardiomyopathy   Uterine cancer Sister    Colon cancer Neg Hx    Esophageal cancer Neg Hx    Rectal cancer Neg Hx    Stomach cancer Neg Hx    Colon polyps Neg Hx     Social History   Tobacco Use   Smoking status: Former    Packs/day: 1.00    Years: 5.00    Pack years: 5.00    Types: Cigarettes    Quit date: 02/10/1978    Years since quitting: 43.6   Smokeless tobacco: Never  Vaping Use   Vaping Use: Never used  Substance Use Topics   Alcohol use: No   Drug use: No    ROS   Objective:   Vitals: BP 120/78 (BP Location: Right Arm)   Pulse 83   Temp 98 F (36.7 C) (Oral)   Resp 18   SpO2 97%   Physical Exam Constitutional:      General: She is not in acute distress.    Appearance: Normal appearance. She  is well-developed. She is not ill-appearing, toxic-appearing or diaphoretic.  HENT:     Head: Normocephalic and atraumatic.     Nose: Nose normal.     Mouth/Throat:     Mouth: Mucous membranes are moist.     Pharynx: Oropharynx is clear.  Eyes:     General: No scleral icterus.       Right eye: No discharge.        Left eye: No discharge.     Extraocular Movements: Extraocular movements intact.     Conjunctiva/sclera: Conjunctivae normal.     Pupils: Pupils are equal, round, and reactive to light.  Cardiovascular:     Rate and Rhythm: Normal rate.  Pulmonary:     Effort: Pulmonary effort is normal.  Abdominal:     General: Bowel sounds are normal. There is no distension.     Palpations: Abdomen is soft. There is no mass.     Tenderness: There is no abdominal tenderness. There is no right CVA tenderness, left CVA tenderness, guarding or rebound.  Skin:    General: Skin is warm and dry.  Neurological:     General: No focal deficit present.     Mental Status: She is alert and oriented to person, place, and time.  Psychiatric:        Mood and Affect: Mood normal.        Behavior: Behavior normal.        Thought Content: Thought content normal.        Judgment: Judgment normal.    Results for orders placed or performed during the hospital encounter of 10/04/21 (from the past 24 hour(s))  POCT urinalysis dipstick     Status: Abnormal   Collection Time: 10/04/21  3:40 PM  Result Value Ref Range   Color, UA red (A) yellow   Clarity, UA clear clear   Glucose, UA =250 (A) negative mg/dL   Bilirubin, UA moderate (A) negative   Ketones, POC UA small (15) (A) negative mg/dL   Spec Grav, UA <=1.005 (A) 1.010 - 1.025   Blood, UA trace-intact (A) negative   pH, UA 5.0 5.0 - 8.0   Protein Ur, POC >=300 (A) negative mg/dL   Urobilinogen, UA >=8.0 (A) 0.2 or 1.0 E.U./dL   Nitrite, UA Positive (A) Negative   Leukocytes, UA Large (3+) (A) Negative    Assessment and Plan :   PDMP not  reviewed this encounter.  1.  Acute cystitis with hematuria   2. Dysuria   3. Urinary frequency    Start Keflex to cover for acute cystitis, urine culture pending.  Recommended aggressive hydration, limiting urinary irritants.  Recheck with her urologist.  Counseled patient on potential for adverse effects with medications prescribed/recommended today, ER and return-to-clinic precautions discussed, patient verbalized understanding.     Jaynee Eagles, [place] 10/05/21 [phone]

## 2021-10-07 LAB — URINE CULTURE: Culture: >=100000 — AB

## 2021-10-19 ENCOUNTER — Encounter: Payer: Self-pay | Admitting: Obstetrics and Gynecology

## 2021-10-19 DIAGNOSIS — N3000 Acute cystitis without hematuria: Secondary | ICD-10-CM

## 2021-10-21 MED ORDER — NITROFURANTOIN MONOHYD MACRO 100 MG PO CAPS: 100.0000 mg | ORAL_CAPSULE | Freq: Two times a day (BID) | ORAL | 0 refills | Status: AC

## 2021-10-24 ENCOUNTER — Encounter: Payer: Self-pay | Admitting: Obstetrics and Gynecology

## 2021-10-24 ENCOUNTER — Other Ambulatory Visit: Payer: Self-pay

## 2021-10-24 ENCOUNTER — Ambulatory Visit (INDEPENDENT_AMBULATORY_CARE_PROVIDER_SITE_OTHER): Payer: Medicare PPO | Admitting: Obstetrics and Gynecology

## 2021-10-24 VITALS — BP 121/82 | HR 65 | Ht 67.5 in | Wt 174.0 lb

## 2021-10-24 DIAGNOSIS — N390 Urinary tract infection, site not specified: Secondary | ICD-10-CM | POA: Diagnosis not present

## 2021-10-24 DIAGNOSIS — N952 Postmenopausal atrophic vaginitis: Secondary | ICD-10-CM

## 2021-10-24 DIAGNOSIS — N3281 Overactive bladder: Secondary | ICD-10-CM | POA: Diagnosis not present

## 2021-10-24 LAB — POCT URINALYSIS DIPSTICK
Appearance: NORMAL
Bilirubin, UA: NEGATIVE
Glucose, UA: NEGATIVE
Ketones, UA: NEGATIVE
Leukocytes, UA: NEGATIVE
Nitrite, UA: NEGATIVE
Protein, UA: NEGATIVE
Spec Grav, UA: 1.015 (ref 1.010–1.025)
Urobilinogen, UA: 0.2 E.U./dL
pH, UA: 6.5 (ref 5.0–8.0)

## 2021-10-24 MED ORDER — ESTRADIOL 0.1 MG/GM VA CREA: 0.5000 g | TOPICAL_CREAM | VAGINAL | 11 refills | Status: DC

## 2021-10-24 MED ORDER — TROSPIUM CHLORIDE ER 60 MG PO CP24: 1.0000 | ORAL_CAPSULE | Freq: Every day | ORAL | 11 refills | Status: DC

## 2021-10-24 MED ORDER — NITROFURANTOIN MONOHYD MACRO 100 MG PO CAPS
100.0000 mg | ORAL_CAPSULE | Freq: Every day | ORAL | 5 refills | Status: DC
Start: 2021-10-24 — End: 2022-08-28

## 2021-10-24 NOTE — Progress Notes (Signed)
[facility] Return Visit  SUBJECTIVE  History of Present Illness: Alexis Wallace is a 65 y.o. female seen in follow-up for recurrent UTI and overactive bladder. At last visit she completed her prophylactic antibiotics (bactrim) and wanted to wean off the tropsium. She was treated for another UTI at urgent care- citrobacter koseri, resistant to bactrim. She was treated with keflex but this did not resolve her symptoms and was then given macrobid.  Has been taking trospium every other day for a week.   Still using vaginal estrogen twice a week. Sometimes remebers to take cranberry and d-mannose.   Past Medical History: Patient  has a past medical history of Allergy, Anemia, iron deficiency, B12 deficiency, CAP (community acquired pneumonia) (11/18/2014), GERD (gastroesophageal reflux disease), Heartburn, Iron deficiency, Osteoarthritis, Osteopenia, Solitary pulmonary nodule (12/05/2014), Tibial plateau fracture, and Urinary tract infection.   Past Surgical History: She  has a past surgical history that includes Gastric bypass (2001); Cosmetic surgery; carpal tunnel right; Colonoscopy; and Polypectomy.   Medications: She has a current medication list which includes the following prescription(s): calcium carbonate, cyanocobalamin, estradiol, estradiol, fluticasone, multivitamin, nitrofurantoin (macrocrystal-monohydrate), omeprazole, OVER THE COUNTER MEDICATION, progesterone, temazepam, trospium chloride, valacyclovir, and wegovy.   Allergies: Patient has No Known Allergies.   Social History: Patient  reports that she quit smoking about 43 years ago. Her smoking use included cigarettes. She has a 5.00 pack-year smoking history. She has never used smokeless tobacco. She reports that she does not drink alcohol and does not use drugs.      OBJECTIVE     Physical Exam: Vitals:   10/24/21 0923  BP: 121/82  Pulse: 65  Weight: 174 lb (78.9 kg)  Height: 5' 7.5" (1.715 m)    Gen: No apparent distress, A&O x 3.  Detailed Urogynecologic Evaluation:  Deferred.    POC urine: trace blood, otherwise negative  ASSESSMENT AND PLAN    Alexis Wallace is a 65 y.o. with:  1. Recurrent urinary tract infection   2. Overactive bladder    Recurrent UTI - Will restart antibiotic prophylaxis. Since her last culture was resistant to bactrim, will start nitrofurantoin 100mg  daily.  - continue estrace cream 0.5g twice a week  2. OAB - refill provided for trospium. She would like to continue to wean off since she rarely has symptoms unless she has a UTI.   Follow up 6 months or sooner if needed  Jaquita Folds, MD  Time spent: I spent 20 minutes dedicated to the care of this patient on the date of this encounter to include pre-visit review of records, face-to-face time with the patient and post visit documentation and ordering medication/ testing.

## 2021-10-28 ENCOUNTER — Other Ambulatory Visit: Payer: Self-pay

## 2021-10-28 ENCOUNTER — Ambulatory Visit (INDEPENDENT_AMBULATORY_CARE_PROVIDER_SITE_OTHER): Payer: Medicare PPO

## 2021-10-28 DIAGNOSIS — Z0000 Encounter for general adult medical examination without abnormal findings: Secondary | ICD-10-CM

## 2021-10-28 NOTE — Progress Notes (Signed)
Virtual Visit via Telephone Note  I connected with  Alexis Wallace on 10/28/21 at 11:00 AM EST by telephone and verified that I am speaking with the correct person using two identifiers.  Medicare Annual Wellness visit completed telephonically due to Covid-19 pandemic.   Persons participating in this call: This Health Coach and this patient.   Location: Patient: Home Provider: Office   I discussed the limitations, risks, security and privacy concerns of performing an evaluation and management service by telephone and the availability of in person appointments. The patient expressed understanding and agreed to proceed.  Unable to perform video visit due to video visit attempted and failed and/or patient does not have video capability.   Some vital signs may be absent or patient reported.   Willette Brace, LPN   Subjective:   Alexis Wallace is a 65 y.o. female who presents for Medicare Annual (Subsequent) preventive examination.  Review of Systems     Cardiac Risk Factors include: advanced age (>78men, >54 women);dyslipidemia     Objective:    There were no vitals filed for this visit. There is no height or weight on file to calculate BMI.  Advanced Directives 10/28/2021 10/24/2020 10/02/2016 09/15/2016 09/13/2016  Does Patient Have a Medical Advance Directive? Yes Yes Yes Yes No  Type of Advance Directive Healthcare Power of Attorney Living will;Healthcare Power of [place];Living will [place];Living will -  Copy of [place] in Chart? Yes - validated most recent copy scanned in chart (See row information) No - copy requested No - copy requested - -    Current Medications (verified) Outpatient Encounter Medications as of 10/28/2021  Medication Sig   calcium carbonate 200 MG capsule Take 3,000 mg by mouth daily.   cyanocobalamin (,VITAMIN B-12,) 1000 MCG/ML injection INJECT 1ML (1000MCG TOTAL) INTO  THE MUSCLE EVERY 30 DAYS.   estradiol (ESTRACE) 0.1 MG/GM vaginal cream Place 0.5 g vaginally 2 (two) times a week. Place 0.5g nightly for two weeks then twice a week after   estradiol (ESTRACE) 2 MG tablet Take 2 mg by mouth daily.   fluticasone (FLONASE) 50 MCG/ACT nasal spray USE 2 SPRAYS IN EACH NOSTRIL ONCE DAILY   Multiple Vitamin (MULTIVITAMIN) tablet Take 1 tablet by mouth daily.   nitrofurantoin, macrocrystal-monohydrate, (MACROBID) 100 MG capsule Take 1 capsule (100 mg total) by mouth daily.   omeprazole (PRILOSEC) 20 MG capsule TAKE ONE CAPSULE BY MOUTH DAILY   OVER THE COUNTER MEDICATION Vitamin D 3 one capsule daily.   progesterone (PROMETRIUM) 200 MG capsule TAKE ONE CAPSULE BY MOUTH DAILY AT BEDTIME.   temazepam (RESTORIL) 15 MG capsule TAKE ONE CAPSULE (15MG  TOTAL) BY MOUTH AT BEDTIME AS NEEDED FOR SLEEP   Trospium Chloride 60 MG CP24 Take 1 capsule (60 mg total) by mouth daily.   valACYclovir (VALTREX) 1000 MG tablet Take 2 pills twice a day for 1 day at first sign of cold sore   WEGOVY 2.4 MG/0.75ML SOAJ INJECT 2.4MG  INTO THE SKIN ONCE A WEEK   No facility-administered encounter medications on file as of 10/28/2021.    Allergies (verified) Patient has no known allergies.   History: Past Medical History:  Diagnosis Date   Allergy    Anemia, iron deficiency    B12 deficiency    CAP (community acquired pneumonia) 11/18/2014   GERD (gastroesophageal reflux disease)    Heartburn    Iron deficiency    Osteoarthritis    Osteopenia  Solitary pulmonary nodule 12/05/2014   Dx 11/19/14  3 mm > f/u 11/27/2015 > resolved, no further studies rec     Tibial plateau fracture    june 2020- murphy/wainer cared for her   Urinary tract infection    Past Surgical History:  Procedure Laterality Date   carpal tunnel right     COLONOSCOPY     COSMETIC SURGERY     tummy tuck   GASTRIC BYPASS  2001   POLYPECTOMY     Family History  Problem Relation Age of Onset   Heart  disease Father    Lung cancer Mother        stage IV, smoker   Heart disease Sister        Hypertrophic cardiomyopathy   Uterine cancer Sister    Colon cancer Neg Hx    Esophageal cancer Neg Hx    Rectal cancer Neg Hx    Stomach cancer Neg Hx    Colon polyps Neg Hx    Social History   Socioeconomic History   Marital status: Married    Spouse name: Not on file   Number of children: 2   Years of education: Not on file   Highest education level: Not on file  Occupational History   Not on file  Tobacco Use   Smoking status: Former    Packs/day: 1.00    Years: 5.00    Pack years: 5.00    Types: Cigarettes    Quit date: 02/10/1978    Years since quitting: 43.7   Smokeless tobacco: Never  Vaping Use   Vaping Use: Never used  Substance and Sexual Activity   Alcohol use: No   Drug use: No   Sexual activity: Not Currently    Birth control/protection: Post-menopausal  Other Topics Concern   Not on file  Social History Narrative   Married to [employer]. 2 kids. Jenny Reichmann (his wife had TBI being struck by car) with daughter [name]. Patient takes care of [name] and son lives with her and husband) and Colletta [place] lives in [place] with no kids.       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  Raising granddaughter after daughter has TBI.       Hobbies: spending time with friends and family          Social Determinants of Radio broadcast assistant Strain: Low Risk    Difficulty of Paying Living Expenses: Not hard at all  Food Insecurity: No Food Insecurity   Worried About Charity fundraiser in the Last Year: Never true   Arboriculturist in the Last Year: Never true  Transportation Needs: No Transportation Needs   Lack of Transportation (Medical): No   Lack of Transportation (Non-Medical): No  Physical Activity: Inactive   Days of Exercise per Week: 0 days   Minutes of Exercise per  Session: 0 min  Stress: No Stress Concern Present   Feeling of Stress : Not at all  Social Connections: Moderately Integrated   Frequency of Communication with Friends and Family: More than three times a week   Frequency of Social Gatherings with Friends and Family: More than three times a week   Attends Religious Services: Never   Marine scientist or Organizations: Yes   Attends Archivist Meetings: 1 to 4 times per year   Marital Status: Married  Tobacco Counseling Counseling given: Not Answered   Clinical Intake:  Pre-visit preparation completed: Yes  Pain : No/denies pain     BMI - recorded: 26.85 Nutritional Status: BMI 25 -29 Overweight Diabetes: No  How often do you need to have someone help you when you read instructions, pamphlets, or other written materials from your doctor or pharmacy?: 1 - Never  Diabetic?no  Interpreter Needed?: No  Information entered by :: Shaunette Gassner, LPN   Activities of Daily Living In your present state of health, do you have any difficulty performing the following activities: 10/28/2021  Hearing? N  Vision? N  Difficulty concentrating or making decisions? N  Walking or climbing stairs? N  Dressing or bathing? N  Doing errands, shopping? N  Preparing Food and eating ? N  Using the Toilet? N  In the past six months, have you accidently leaked urine? N  Do you have problems with loss of bowel control? N  Managing your Medications? N  Managing your Finances? N  Housekeeping or managing your Housekeeping? N  Some recent data might be hidden    Patient Care Team: Marin Olp, MD as PCP - General (Family Medicine)  Indicate any recent Medical Services you may have received from other than [facility] providers in the past year (date may be approximate).     Assessment:   This is a routine wellness examination for Fatima.  Hearing/Vision screen Hearing Screening - Comments:: Pt denies any hearing issues   Vision Screening - Comments:: Pt follows up annually for eye exams   Dietary issues and exercise activities discussed: Current Exercise Habits: The patient does not participate in regular exercise at present   Goals Addressed             This Visit's Progress    Patient Stated       Lose weight        Depression Screen PHQ 2/9 Scores 10/28/2021 07/31/2021 02/24/2021 10/24/2020 04/26/2020 08/04/2019 12/13/2018  PHQ - 2 Score 0 0 0 0 0 0 0  PHQ- 9 Score - - - - 0 - -    Fall Risk Fall Risk  10/28/2021 07/31/2021 10/24/2020 07/19/2020 08/04/2019  Falls in the past year? 0 0 0 0 1  Number falls in past yr: 0 0 0 0 0  Injury with Fall? 0 0 0 0 1  Risk for fall due to : Impaired vision No Fall Risks Impaired vision - -  Follow up Falls prevention discussed Falls evaluation completed Falls prevention discussed - -    FALL RISK PREVENTION PERTAINING TO THE HOME:  Any stairs in or around the home? Yes  If so, are there any without handrails? No  Home free of loose throw rugs in walkways, pet beds, electrical cords, etc? Yes  Adequate lighting in your home to reduce risk of falls? Yes   ASSISTIVE DEVICES UTILIZED TO PREVENT FALLS:  Life alert? No  Use of a cane, walker or w/c? No  Grab bars in the bathroom? No  Shower chair or bench in shower? No  Elevated toilet seat or a handicapped toilet? No   TIMED UP AND GO:  Was the test performed? No .  Cognitive Function:     6CIT Screen 10/28/2021  What Year? 0 points  What month? 0 points  What time? 0 points  Count back from 20 0 points  Months in reverse 0 points  Repeat phrase 0 points  Total Score 0    Immunizations  Immunization History  Administered Date(s) Administered   Fluad Quad(high Dose 65+) 08/04/2019, 09/09/2020   Influenza Split 10/03/2012   Influenza Whole 09/06/2001, 10/12/2007, 09/25/2008, 09/01/2010   Influenza,inj,Quad PF,6+ Mos 08/28/2013, 08/30/2014   Influenza-Unspecified 09/21/2015, 08/07/2016,  09/24/2021   PFIZER Comirnaty(Gray Top)Covid-19 Tri-Sucrose Vaccine 04/14/2021   PFIZER(Purple Top)SARS-COV-2 Vaccination 12/26/2019, 01/16/2020, 07/31/2020   Pneumococcal Polysaccharide-23 08/04/2019   Td 06/07/2003   Tdap 12/10/2015   Zoster Recombinat (Shingrix) 09/25/2019, 08/01/2021    TDAP status: Up to date  Flu Vaccine status: Up to date  Pneumococcal vaccine status: Up to date  Covid-19 vaccine status: Completed vaccines  Qualifies for Shingles Vaccine? Yes   Zostavax completed Yes   Shingrix Completed?: Yes  Screening Tests Health Maintenance  Topic Date Due   Pneumonia Vaccine 42+ Years old (2 - PCV) 08/03/2020   COVID-19 Vaccine (5 - Booster for Pfizer series) 06/09/2021   MAMMOGRAM  01/17/2023   TETANUS/TDAP  12/09/2025   COLONOSCOPY (Pts 45-21yrs Insurance coverage will need to be confirmed)  08/23/2027   INFLUENZA VACCINE  Completed   DEXA SCAN  Completed   Hepatitis C Screening  Completed   Zoster Vaccines- Shingrix  Completed   HPV VACCINES  Aged Out    Health Maintenance  Health Maintenance Due  Topic Date Due   Pneumonia Vaccine 35+ Years old (2 - PCV) 08/03/2020   COVID-19 Vaccine (5 - Booster for [place] series) 06/09/2021    Colorectal cancer screening: Type of screening: Colonoscopy. Completed 08/22/20. Repeat every 7 years  Mammogram status: Completed 01/1121. Repeat every year  Bone Density status: Completed 01/17/21. Results reflect: Bone density results: OSTEOPENIA. Repeat every 2 years.   Additional Screening:  Hepatitis C Screening:  Completed 12/10/15  Vision Screening: Recommended annual ophthalmology exams for early detection of glaucoma and other disorders of the eye. Is the patient up to date with their annual eye exam?  Yes  Who is the provider or what is the name of the office in which the patient attends annual eye exams? Pt follows with provider  If pt is not established with a provider, would they like to be referred to a  provider to establish care? No .   Dental Screening: Recommended annual dental exams for proper oral hygiene  Community Resource Referral / Chronic Care Management: CRR required this visit?  No   CCM required this visit?  No      Plan:     I have personally reviewed and noted the following in the patient's chart:   Medical and social history Use of alcohol, tobacco or illicit drugs  Current medications and supplements including opioid prescriptions.  Functional ability and status Nutritional status Physical activity Advanced directives List of other physicians Hospitalizations, surgeries, and ER visits in previous 12 months Vitals Screenings to include cognitive, depression, and falls Referrals and appointments  In addition, I have reviewed and discussed with patient certain preventive protocols, quality metrics, and best practice recommendations. A written personalized care plan for preventive services as well as general preventive health recommendations were provided to patient.     Willette Brace, LPN   [id number]   Nurse Notes: None

## 2021-10-28 NOTE — Patient Instructions (Addendum)
Alexis Wallace , Thank you for taking time to come for your Medicare Wellness Visit. I appreciate your ongoing commitment to your health goals. Please review the following plan we discussed and let me know if I can assist you in the future.   Screening recommendations/referrals: Colonoscopy: Done 08/22/20 repeat every 7 years  Mammogram: Done 01/17/21 repeat every year Bone Density: Done 01/17/21 repeat every 2 years  Recommended yearly ophthalmology/optometry visit for glaucoma screening and checkup Recommended yearly dental visit for hygiene and checkup  Vaccinations: Influenza vaccine: Done 09/24/21 repeat every year Pneumococcal vaccine: Up to date Tdap vaccine: Done 12/10/15 repeat every 10 years  Shingles vaccine: Completed 09/25/19  & 08/01/21 Covid-19:Completed 1/9, 2/9, 07/31/20 & 04/14/21  Advanced directives: Copies in chart   Conditions/risks identified: lose a little weight   Next appointment: Follow up in one year for your annual wellness visit    Preventive Care 65 Years and Older, Female Preventive care refers to lifestyle choices and visits with your health care provider that can promote health and wellness. What does preventive care include? A yearly physical exam. This is also called an annual well check. Dental exams once or twice a year. Routine eye exams. Ask your health care provider how often you should have your eyes checked. Personal lifestyle choices, including: Daily care of your teeth and gums. Regular physical activity. Eating a healthy diet. Avoiding tobacco and drug use. Limiting alcohol use. Practicing safe sex. Taking low-dose aspirin every day. Taking vitamin and mineral supplements as recommended by your health care provider. What happens during an annual well check? The services and screenings done by your health care provider during your annual well check will depend on your age, overall health, lifestyle risk factors, and family history of  disease. Counseling  Your health care provider may ask you questions about your: Alcohol use. Tobacco use. Drug use. Emotional well-being. Home and relationship well-being. Sexual activity. Eating habits. History of falls. Memory and ability to understand (cognition). Work and work Statistician. Reproductive health. Screening  You may have the following tests or measurements: Height, weight, and BMI. Blood pressure. Lipid and cholesterol levels. These may be checked every 5 years, or more frequently if you are over 43 years old. Skin check. Lung cancer screening. You may have this screening every year starting at age 40 if you have a 30-pack-year history of smoking and currently smoke or have quit within the past 15 years. Fecal occult blood test (FOBT) of the stool. You may have this test every year starting at age 88. Flexible sigmoidoscopy or colonoscopy. You may have a sigmoidoscopy every 5 years or a colonoscopy every 10 years starting at age 33. Hepatitis C blood test. Hepatitis B blood test. Sexually transmitted disease (STD) testing. Diabetes screening. This is done by checking your blood sugar (glucose) after you have not eaten for a while (fasting). You may have this done every 1-3 years. Bone density scan. This is done to screen for osteoporosis. You may have this done starting at age 71. Mammogram. This may be done every 1-2 years. Talk to your health care provider about how often you should have regular mammograms. Talk with your health care provider about your test results, treatment options, and if necessary, the need for more tests. Vaccines  Your health care provider may recommend certain vaccines, such as: Influenza vaccine. This is recommended every year. Tetanus, diphtheria, and acellular pertussis (Tdap, Td) vaccine. You may need a Td booster every 10 years. Zoster vaccine.  You may need this after age 44. Pneumococcal 13-valent conjugate (PCV13) vaccine. One  dose is recommended after age 56. Pneumococcal polysaccharide (PPSV23) vaccine. One dose is recommended after age 12. Talk to your health care provider about which screenings and vaccines you need and how often you need them. This information is not intended to replace advice given to you by your health care provider. Make sure you discuss any questions you have with your health care provider. Document Released: 12/20/2015 Document Revised: 08/12/2016 Document Reviewed: 09/24/2015 Elsevier Interactive Patient Education  2017 [place] Prevention in the Home Falls can cause injuries. They can happen to people of all ages. There are many things you can do to make your home safe and to help prevent falls. What can I do on the outside of my home? Regularly fix the edges of walkways and driveways and fix any cracks. Remove anything that might make you trip as you walk through a door, such as a raised step or threshold. Trim any bushes or trees on the path to your home. Use bright outdoor lighting. Clear any walking paths of anything that might make someone trip, such as rocks or tools. Regularly check to see if handrails are loose or broken. Make sure that both sides of any steps have handrails. Any raised decks and porches should have guardrails on the edges. Have any leaves, snow, or ice cleared regularly. Use sand or salt on walking paths during winter. Clean up any spills in your garage right away. This includes oil or grease spills. What can I do in the bathroom? Use night lights. Install grab bars by the toilet and in the tub and shower. Do not use towel bars as grab bars. Use non-skid mats or decals in the tub or shower. If you need to sit down in the shower, use a plastic, non-slip stool. Keep the floor dry. Clean up any water that spills on the floor as soon as it happens. Remove soap buildup in the tub or shower regularly. Attach bath mats securely with double-sided  non-slip rug tape. Do not have throw rugs and other things on the floor that can make you trip. What can I do in the bedroom? Use night lights. Make sure that you have a light by your bed that is easy to reach. Do not use any sheets or blankets that are too big for your bed. They should not hang down onto the floor. Have a firm chair that has side arms. You can use this for support while you get dressed. Do not have throw rugs and other things on the floor that can make you trip. What can I do in the kitchen? Clean up any spills right away. Avoid walking on wet floors. Keep items that you use a lot in easy-to-reach places. If you need to reach something above you, use a strong step stool that has a grab bar. Keep electrical cords out of the way. Do not use floor polish or wax that makes floors slippery. If you must use wax, use non-skid floor wax. Do not have throw rugs and other things on the floor that can make you trip. What can I do with my stairs? Do not leave any items on the stairs. Make sure that there are handrails on both sides of the stairs and use them. Fix handrails that are broken or loose. Make sure that handrails are as long as the stairways. Check any carpeting to make sure that it is  firmly attached to the stairs. Fix any carpet that is loose or worn. Avoid having throw rugs at the top or bottom of the stairs. If you do have throw rugs, attach them to the floor with carpet tape. Make sure that you have a light switch at the top of the stairs and the bottom of the stairs. If you do not have them, ask someone to add them for you. What else can I do to help prevent falls? Wear shoes that: Do not have high heels. Have rubber bottoms. Are comfortable and fit you well. Are closed at the toe. Do not wear sandals. If you use a stepladder: Make sure that it is fully opened. Do not climb a closed stepladder. Make sure that both sides of the stepladder are locked into place. Ask  someone to hold it for you, if possible. Clearly mark and make sure that you can see: Any grab bars or handrails. First and last steps. Where the edge of each step is. Use tools that help you move around (mobility aids) if they are needed. These include: Canes. Walkers. Scooters. Crutches. Turn on the lights when you go into a dark area. Replace any light bulbs as soon as they burn out. Set up your furniture so you have a clear path. Avoid moving your furniture around. If any of your floors are uneven, fix them. If there are any pets around you, be aware of where they are. Review your medicines with your doctor. Some medicines can make you feel dizzy. This can increase your chance of falling. Ask your doctor what other things that you can do to help prevent falls. This information is not intended to replace advice given to you by your health care provider. Make sure you discuss any questions you have with your health care provider. Document Released: 09/19/2009 Document Revised: 04/30/2016 Document Reviewed: 12/28/2014 Elsevier Interactive Patient Education  2017 [employer].

## 2021-11-24 ENCOUNTER — Other Ambulatory Visit: Payer: Self-pay | Admitting: Family Medicine

## 2021-12-08 ENCOUNTER — Other Ambulatory Visit: Payer: Self-pay | Admitting: Family Medicine

## 2021-12-08 ENCOUNTER — Encounter: Payer: Self-pay | Admitting: Family Medicine

## 2021-12-08 MED ORDER — WEGOVY 2.4 MG/0.75ML ~~LOC~~ SOAJ: 2.4000 mg | SUBCUTANEOUS | 1 refills | Status: DC

## 2021-12-15 ENCOUNTER — Encounter: Payer: Self-pay | Admitting: Podiatry

## 2021-12-15 ENCOUNTER — Ambulatory Visit: Payer: Medicare PPO | Admitting: Podiatry

## 2021-12-15 ENCOUNTER — Other Ambulatory Visit: Payer: Self-pay

## 2021-12-15 DIAGNOSIS — L600 Ingrowing nail: Secondary | ICD-10-CM | POA: Diagnosis not present

## 2021-12-15 DIAGNOSIS — B351 Tinea unguium: Secondary | ICD-10-CM | POA: Diagnosis not present

## 2021-12-15 DIAGNOSIS — Z79899 Other long term (current) drug therapy: Secondary | ICD-10-CM | POA: Diagnosis not present

## 2021-12-15 NOTE — Patient Instructions (Signed)
Soak Instructions    THE DAY AFTER THE PROCEDURE  Place 1/4 cup of epsom salts in a quart of warm tap water.  Submerge your foot or feet with outer bandage intact for the initial soak; this will allow the bandage to become moist and wet for easy lift off.  Once you remove your bandage, continue to soak in the solution for 20 minutes.  This soak should be done twice a day.  Next, remove your foot or feet from solution, blot dry the affected area and cover.  You may use a band aid large enough to cover the area or use gauze and tape.  Apply other medications to the area as directed by the doctor such as polysporin neosporin.  IF YOUR SKIN BECOMES IRRITATED WHILE USING THESE INSTRUCTIONS, IT IS OKAY TO SWITCH TO  WHITE VINEGAR AND WATER. Or you may use antibacterial soap and water to keep the toe clean  Monitor for any signs/symptoms of infection. Call the office immediately if any occur or go directly to the emergency room. Call with any questions/concerns.  Terbinafine Tablets What is this medication? TERBINAFINE (TER bin a feen) treats fungal infections of the nails. It belongs to a group of medications called antifungals. It will not treat infections caused by bacteria or viruses. This medicine may be used for other purposes; ask your health care provider or pharmacist if you have questions. COMMON BRAND NAME(S): Lamisil, Terbinex What should I tell my care team before I take this medication? They need to know if you have any of these conditions: Liver disease An unusual or allergic reaction to terbinafine, other medications, foods, dyes, or preservatives Pregnant or trying to get pregnant Breast-feeding How should I use this medication? Take this medication by mouth with water. Take it as directed on the prescription label at the same time every day. You can take it with or without food. If it upsets your stomach, take it with food. Keep taking it unless your care team tells you to stop. A  special MedGuide will be given to you by the pharmacist with each prescription and refill. Be sure to read this information carefully each time. Talk to your care team regarding the use of this medication in children. Special care may be needed. Overdosage: If you think you have taken too much of this medicine contact a poison control center or emergency room at once. NOTE: This medicine is only for you. Do not share this medicine with others. What if I miss a dose? If you miss a dose, take it as soon as you can unless it is more than 4 hours late. If it is more than 4 hours late, skip the missed dose. Take the next dose at the normal time. What may interact with this medication? Do not take this medication with any of the following: Pimozide Thioridazine This medication may also interact with the following: Beta blockers Caffeine Certain medications for mental health conditions Cimetidine Cyclosporine Medications for fungal infections like fluconazole and ketoconazole Medications for irregular heartbeat like amiodarone, flecainide and propafenone Rifampin Warfarin This list may not describe all possible interactions. Give your health care provider a list of all the medicines, herbs, non-prescription drugs, or dietary supplements you use. Also tell them if you smoke, drink alcohol, or use illegal drugs. Some items may interact with your medicine. What should I watch for while using this medication? Visit your care team for regular checks on your progress. You may need blood work while you  are taking this medication. It may be some time before you see the benefit from this medication. This medication may cause serious skin reactions. They can happen weeks to months after starting the medication. Contact your care team right away if you notice fevers or flu-like symptoms with a rash. The rash may be red or purple and then turn into blisters or peeling of the skin. Or, you might notice a red rash  with swelling of the face, lips or lymph nodes in your neck or under your arms. This medication can make you more sensitive to the sun. Keep out of the sun, If you cannot avoid being in the sun, wear protective clothing and sunscreen. Do not use sun lamps or tanning beds/booths. What side effects may I notice from receiving this medication? Side effects that you should report to your care team as soon as possible: Allergic reactions--skin rash, itching, hives, swelling of the face, lips, tongue, or throat Change in sense of smell Change in taste Infection--fever, chills, cough, or sore throat Liver injury--right upper belly pain, loss of appetite, nausea, light-colored stool, dark yellow or brown urine, yellowing skin or eyes, unusual weakness or fatigue Low red blood cell level--unusual weakness or fatigue, dizziness, headache, trouble breathing Lupus-like syndrome--joint pain, swelling, or stiffness, butterfly-shaped rash on the face, rashes that get worse in the sun, fever, unusual weakness or fatigue Rash, fever, and swollen lymph nodes Redness, blistering, peeling, or loosening of the skin, including inside the mouth Unusual bruising or bleeding Worsening mood, feelings of depression Side effects that usually do not require medical attention (report to your care team if they continue or are bothersome): Diarrhea Gas Headache Nausea Stomach pain Upset stomach This list may not describe all possible side effects. Call your doctor for medical advice about side effects. You may report side effects to FDA at 1-800-FDA-1088. Where should I keep my medication? Keep out of the reach of children and pets. Store between 20 and 25 degrees C (68 and 77 degrees F). Protect from light. Get rid of any unused medication after the expiration date. To get rid of medications that are no longer needed or have expired: Take the medication to a medication take-back program. Check with your pharmacy or law  enforcement to find a location. If you cannot return the medication, check the label or package insert to see if the medication should be thrown out in the garbage or flushed down the toilet. If you are not sure, ask your care team. If it is safe to put it in the trash, take the medication out of the container. Mix the medication with cat litter, dirt, coffee grounds, or other unwanted substance. Seal the mixture in a bag or container. Put it in the trash. NOTE: This sheet is a summary. It may not cover all possible information. If you have questions about this medicine, talk to your doctor, pharmacist, or health care provider.  2022 Elsevier/Gold Standard (2021-07-09 00:00:00)

## 2021-12-16 LAB — HEPATIC FUNCTION PANEL
ALT: 11 IU/L (ref 0–32)
AST: 17 IU/L (ref 0–40)
Albumin: 4 g/dL (ref 3.8–4.8)
Alkaline Phosphatase: 93 IU/L (ref 44–121)
Bilirubin Total: 0.3 mg/dL (ref 0.0–1.2)
Bilirubin, Direct: <0.1 mg/dL (ref 0.00–0.40)
Total Protein: 6.2 g/dL (ref 6.0–8.5)

## 2021-12-16 LAB — CBC WITH DIFFERENTIAL/PLATELET
Basophils Absolute: 0.1 10*3/uL (ref 0.0–0.2)
Basos: 1 %
EOS (ABSOLUTE): 0.2 10*3/uL (ref 0.0–0.4)
Eos: 4 %
Hematocrit: 36.1 % (ref 34.0–46.6)
Hemoglobin: 12.2 g/dL (ref 11.1–15.9)
Immature Grans (Abs): 0 10*3/uL (ref 0.0–0.1)
Immature Granulocytes: 0 %
Lymphocytes Absolute: 1.8 10*3/uL (ref 0.7–3.1)
Lymphs: 33 %
MCH: 31.3 pg (ref 26.6–33.0)
MCHC: 33.8 g/dL (ref 31.5–35.7)
MCV: 93 fL (ref 79–97)
Monocytes Absolute: 0.3 10*3/uL (ref 0.1–0.9)
Monocytes: 5 %
Neutrophils Absolute: 3 10*3/uL (ref 1.4–7.0)
Neutrophils: 57 %
Platelets: 199 10*3/uL (ref 150–450)
RBC: 3.9 x10E6/uL (ref 3.77–5.28)
RDW: 11.7 % (ref 11.7–15.4)
WBC: 5.3 10*3/uL (ref 3.4–10.8)

## 2021-12-17 NOTE — Progress Notes (Signed)
Subjective: 66 year old female presents the office today for concerns of pain to the medial aspect of both of her big toenails which get tender with left side worse than the right.  She has had previous ingrown toenails removed on the corners.  She said that she is caused extra pressure to the area.  Denies any swelling redness or drainage.  No other concerns today.  Objective: AAO x3, NAD DP/PT pulses palpable bilaterally, CRT less than 3 seconds Mild incurvation present to medial aspect of bilateral hallux toes left side worse than right.  The nails are mildly hypertrophic, dystrophic with yellow, brown discoloration with the medial left hallux toenail worse on the right side.  There is no edema, erythema, drainage or pus or any signs of infection noted today. No pain with calf compression, swelling, warmth, erythema  Assessment: Ingrown toenails, onychomycosis  Plan: -All treatment options discussed with the patient including all alternatives, risks, complications.  -Regards to ingrown toenails discussed partial nail avulsions we held off on this today.  Discussed treating the nail fungus.  Discussed oral, topical treatment.  Much appreciated oral Lamisil.  Discussed side effects.  We will check a CBC and LFT. -Patient encouraged to call the office with any questions, concerns, change in symptoms.   Trula Slade DPM

## 2021-12-18 ENCOUNTER — Encounter: Payer: Self-pay | Admitting: Podiatry

## 2021-12-18 ENCOUNTER — Other Ambulatory Visit: Payer: Self-pay | Admitting: Podiatry

## 2021-12-18 DIAGNOSIS — Z79899 Other long term (current) drug therapy: Secondary | ICD-10-CM

## 2021-12-18 MED ORDER — TERBINAFINE HCL 250 MG PO TABS: 250.0000 mg | ORAL_TABLET | Freq: Every day | ORAL | 0 refills | Status: DC

## 2021-12-29 DIAGNOSIS — M25561 Pain in right knee: Secondary | ICD-10-CM | POA: Diagnosis not present

## 2022-01-06 ENCOUNTER — Encounter: Payer: Self-pay | Admitting: Podiatry

## 2022-01-19 DIAGNOSIS — Z1231 Encounter for screening mammogram for malignant neoplasm of breast: Secondary | ICD-10-CM | POA: Diagnosis not present

## 2022-01-19 DIAGNOSIS — Z124 Encounter for screening for malignant neoplasm of cervix: Secondary | ICD-10-CM | POA: Diagnosis not present

## 2022-01-19 DIAGNOSIS — Z6827 Body mass index (BMI) 27.0-27.9, adult: Secondary | ICD-10-CM | POA: Diagnosis not present

## 2022-01-19 DIAGNOSIS — N950 Postmenopausal bleeding: Secondary | ICD-10-CM | POA: Diagnosis not present

## 2022-01-19 DIAGNOSIS — R87610 Atypical squamous cells of undetermined significance on cytologic smear of cervix (ASC-US): Secondary | ICD-10-CM | POA: Diagnosis not present

## 2022-01-21 ENCOUNTER — Other Ambulatory Visit: Payer: Self-pay | Admitting: Family Medicine

## 2022-01-26 ENCOUNTER — Ambulatory Visit: Payer: Medicare PPO | Admitting: Family Medicine

## 2022-01-28 DIAGNOSIS — Z79899 Other long term (current) drug therapy: Secondary | ICD-10-CM | POA: Diagnosis not present

## 2022-01-29 LAB — CBC WITH DIFFERENTIAL/PLATELET
Basophils Absolute: 0.1 10*3/uL (ref 0.0–0.2)
Basos: 1 %
EOS (ABSOLUTE): 0.2 10*3/uL (ref 0.0–0.4)
Eos: 3 %
Hematocrit: 34.7 % (ref 34.0–46.6)
Hemoglobin: 11.4 g/dL (ref 11.1–15.9)
Immature Grans (Abs): 0 10*3/uL (ref 0.0–0.1)
Immature Granulocytes: 0 %
Lymphocytes Absolute: 1.9 10*3/uL (ref 0.7–3.1)
Lymphs: 31 %
MCH: 30.2 pg (ref 26.6–33.0)
MCHC: 32.9 g/dL (ref 31.5–35.7)
MCV: 92 fL (ref 79–97)
Monocytes Absolute: 0.3 10*3/uL (ref 0.1–0.9)
Monocytes: 5 %
Neutrophils Absolute: 3.7 10*3/uL (ref 1.4–7.0)
Neutrophils: 60 %
Platelets: 196 10*3/uL (ref 150–450)
RBC: 3.77 x10E6/uL (ref 3.77–5.28)
RDW: 12.1 % (ref 11.7–15.4)
WBC: 6.2 10*3/uL (ref 3.4–10.8)

## 2022-01-29 LAB — HEPATIC FUNCTION PANEL
ALT: 10 IU/L (ref 0–32)
AST: 15 IU/L (ref 0–40)
Albumin: 4.1 g/dL (ref 3.8–4.8)
Alkaline Phosphatase: 79 IU/L (ref 44–121)
Bilirubin Total: 0.4 mg/dL (ref 0.0–1.2)
Bilirubin, Direct: 0.13 mg/dL (ref 0.00–0.40)
Total Protein: 6.1 g/dL (ref 6.0–8.5)

## 2022-01-30 ENCOUNTER — Encounter: Payer: Self-pay | Admitting: Podiatry

## 2022-01-30 NOTE — Telephone Encounter (Signed)
Message/information for the physician

## 2022-02-02 NOTE — Progress Notes (Signed)
Phone [phone] In person visit   Subjective:   Alexis Wallace is a 66 y.o. year old very pleasant female patient who presents for/with See problem oriented charting Chief Complaint  Patient presents with   Follow-up    Pt c/o spot on skin above right eye that she just noticed.   Gastroesophageal Reflux   Hyperlipidemia    This visit occurred during the SARS-CoV-2 public health emergency.  Safety protocols were in place, including screening questions prior to the visit, additional usage of staff PPE, and extensive cleaning of exam room while observing appropriate contact time as indicated for disinfecting solutions.   Past Medical History-  Patient Active Problem List   Diagnosis Date Noted   Recurrent UTI 04/07/2016    Priority: High   Hyperlipidemia 05/20/2017    Priority: Medium    History of adenomatous polyp of colon 10/12/2016    Priority: Medium    Rosacea 08/30/2014    Priority: Medium    Insomnia 08/30/2014    Priority: Medium    Bariatric surgery status 09/01/2010    Priority: Medium    DETRUSOR, 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    Priority: Medium    ANEMIA, B12 DEFICIENCY 08/15/2007    Priority: Medium    Plantar fasciitis, right 12/13/2014    Priority: Low   Solitary pulmonary nodule 12/05/2014    Priority: Low   CAP (community acquired pneumonia) 11/18/2014    Priority: Low   Family history of hypertrophic cardiomyopathy 05/13/2011    Priority: Low   Actinic keratosis 10/22/2010    Priority: Low   ALLERGIC RHINITIS 03/01/2008    Priority: Low   GERD 06/02/2007    Priority: Low   OSTEOARTHRITIS 06/02/2007    Priority: Low   OSTEOPENIA 06/02/2007    Priority: Low   COVID-19 07/05/2020    Medications- reviewed and updated Current Outpatient Medications  Medication Sig Dispense Refill   calcium carbonate 200 MG capsule Take 3,000 mg by mouth daily.     cyanocobalamin (,VITAMIN B-12,) 1000 MCG/ML injection INJECT 1ML (1000MCG TOTAL) INTO THE MUSCLE  EVERY 30 DAYS. 3 mL 3   estradiol (ESTRACE) 0.1 MG/GM vaginal cream Place 0.5 g vaginally 2 (two) times a week. Place 0.5g nightly for two weeks then twice a week after 30 g 11   estradiol (ESTRACE) 2 MG tablet Take 2 mg by mouth daily.     fluticasone (FLONASE) 50 MCG/ACT nasal spray USE 2 SPRAYS IN EACH NOSTRIL ONCE DAILY 16 g 1   Multiple Vitamin (MULTIVITAMIN) tablet Take 1 tablet by mouth daily.     nitrofurantoin, macrocrystal-monohydrate, (MACROBID) 100 MG capsule Take 1 capsule (100 mg total) by mouth daily. 30 capsule 5   omeprazole (PRILOSEC) 20 MG capsule TAKE ONE CAPSULE BY MOUTH DAILY 90 capsule 3   OVER THE COUNTER MEDICATION Vitamin D 3 one capsule daily.     progesterone (PROMETRIUM) 200 MG capsule TAKE ONE CAPSULE BY MOUTH DAILY AT BEDTIME.     temazepam (RESTORIL) 15 MG capsule TAKE ONE CAPSULE (15MG  TOTAL) BY MOUTH AT BEDTIME AS NEEDED FOR SLEEP 30 capsule 5   terbinafine (LAMISIL) 250 MG tablet Take 1 tablet (250 mg total) by mouth daily. 90 tablet 0   valACYclovir (VALTREX) 1000 MG tablet Take 2 pills twice a day for 1 day at first sign of cold sore 30 tablet 1   WEGOVY 2.4 MG/0.75ML SOAJ INJECT 2.4MG  INTO THE SKIN ONCE A WEEK 3 mL 1   Trospium Chloride 60 MG CP24  Take 1 capsule (60 mg total) by mouth daily. 30 capsule 11   No current facility-administered medications for this visit.     Objective:  BP 138/72    Pulse 64    Temp (!) 96.8 F (36 C)    Ht 5' 7.5" (1.715 m)    Wt 168 lb 3.2 oz (76.3 kg)    SpO2 100%    BMI 25.96 kg/m  Gen: NAD, resting comfortably CV: RRR no murmurs rubs or gallops Lungs: CTAB no crackles, wheeze, rhonchi Ext: trace edema Skin: warm, dry     Assessment and Plan    # Obesity--> now overweight.   S:medication: Wegovy injections  up to 2.4 milligram per week.  Started about a year ago and [place] after prior failures with weight watchers alone.  Patient had declined healthy weight to wellness program. -Last visit at the gym 2 to 3 days  a week and doing weight watchers-today she reports- she is doing gym still but does not feel needs weight wtachers  -She does have a history of gastric bypass as well  -This is very important for her as cares for her 6 year old granddaughter  - helped with appetite substantially- does get side effect of some change in taste which made food not as appetizing.   -she feels good with this overall- helps with satiety - does feel waning affects by end of week Wt Readings from Last 3 Encounters:  02/04/22 168 lb 3.2 oz (76.3 kg)  10/24/21 174 lb (78.9 kg)  07/31/21 186 lb (84.4 kg)  A/P: Continued excellent progress now very close to normal BMI.  We discussed concern about rebound when coming off medication- we are going to try to get BMI under 25 then monitor- may see how she does and even consider lower dose - 45 lbs total weight loss   #Vitamin D deficiency S: Medication: was on 50k units in the pat -she is on 3000 units per day but forgets to take most days - does much better with 50k units.  Last vitamin D Lab Results  Component Value Date   VD25OH 31.46 07/31/2021  A/P: Well-controlled on last check-she is forgetting doses and does better with weekly. To make sure not getting too high we opted for 50k units every other week- sent in as weekly but she knows to space out.    # Insomnia S:medication: temazepam 15 mg as needed-she reports taking less than once a week  A/P:doing well with sparing use- continue current meds   # GERD S:Medication:  omeprazole 20 mg daily most days.  A/P: doing well overall- last visit was having some epigastric discomfort at times but now better- wonder if that's because she has lost further weight- will continue current meds for now- if no issues by next visit even consider pepcid.    # B12 deficiency and patient with history of gastric bypass S: Current treatment/medication (oral vs. IM):  1000 mcg/mL injection monthly at home Lab Results  Component Value  Date   [id number] 07/31/2021  A/P: stable last check- continue current meds- check b12 next visit.     #hyperlipidemia S: Medication:none. 10 year ascvd risk of 5.2% previously -sister with HOCM- patient does not have gene, dad with CHF -We discussed if risk is above 7.5% consider CT cardiac scoring-recalculate next visit  Lab Results  Component Value Date   CHOL 168 07/31/2021   HDL 57.90 07/31/2021   [place] 95 07/31/2021   LDLDIRECT 84.0 12/10/2015  TRIG 74.0 07/31/2021   CHOLHDL 3 07/31/2021   A/P: mild elevations- but has had significant weight loss- recheck in 6 months and recalculate 10 year ascvd risk- for now remain of fmeds  #chronic bactrim worked well for UTI prevention . Off trospium- she follows with Dr. Wannetta Sender   #cold sores- doing well recently- no refill needed  #Osteoporosis-follows with Dr. Nori Riis (states on estrogen as treatment basically- we will see what next gyn says though) - who will be retiring after 45 years.   #on terbinafine through podiatry- they have monitored liver thankfully Lab Results  Component Value Date   ALT 10 01/28/2022   AST 15 01/28/2022   ALKPHOS 79 01/28/2022   BILITOT 0.4 01/28/2022   #Lesion above her right eye- appears to be SK - she will monitor and sees dermatology  #Right knee pain- hurt knee doing mobility exercises- Saw Dr. Latanya Maudlin in January. X-rays were ok- was told had good spacing for cartilage. 25% improved . She did report nighttime awakening still think she should follow up.   Health Maintenance Due  Topic Date Due   Pneumonia Vaccine 33+ Years old (2 - PCV) 08/03/2020   COVID-19 Vaccine (5 - Booster for [place] series) 06/09/2021   Recommended follow up: No follow-ups on file. Future Appointments  Date Time Provider [place]  04/24/2022  8:20 AM Jaquita Folds, MD [facility]  11/09/2022 11:45 AM [facility] HEALTH COACH [facility]    Lab/Order associations:   ICD-10-CM   1. Obesity (BMI  30-39.9)  E66.9     2. Primary insomnia  F51.01     3. Gastroesophageal reflux disease, unspecified whether esophagitis present  K21.9     4. Mild hyperlipidemia  E78.5       No orders of the defined types were placed in this encounter.   I,Jada Bradford,acting as a scribe for [name], MD.,have documented all relevant documentation on the behalf of [name], MD,as directed by  [name], MD while in the presence of [name], MD.  I, [name], MD, have reviewed all documentation for this visit. The documentation on 02/04/22 for the exam, diagnosis, procedures, and orders are all accurate and complete.  Return precautions advised.  [name], MD

## 2022-02-04 ENCOUNTER — Other Ambulatory Visit: Payer: Self-pay

## 2022-02-04 ENCOUNTER — Ambulatory Visit: Payer: Medicare PPO | Admitting: Family Medicine

## 2022-02-04 ENCOUNTER — Encounter: Payer: Self-pay | Admitting: Family Medicine

## 2022-02-04 VITALS — BP 138/72 | HR 64 | Temp 96.8°F | Ht 67.5 in | Wt 168.2 lb

## 2022-02-04 DIAGNOSIS — E669 Obesity, unspecified: Secondary | ICD-10-CM

## 2022-02-04 DIAGNOSIS — F5101 Primary insomnia: Secondary | ICD-10-CM | POA: Diagnosis not present

## 2022-02-04 DIAGNOSIS — E663 Overweight: Secondary | ICD-10-CM

## 2022-02-04 DIAGNOSIS — Z23 Encounter for immunization: Secondary | ICD-10-CM

## 2022-02-04 DIAGNOSIS — E785 Hyperlipidemia, unspecified: Secondary | ICD-10-CM

## 2022-02-04 DIAGNOSIS — K219 Gastro-esophageal reflux disease without esophagitis: Secondary | ICD-10-CM | POA: Diagnosis not present

## 2022-02-04 MED ORDER — CHOLECALCIFEROL 1.25 MG (50000 UT) PO TABS: ORAL_TABLET | ORAL | 0 refills | Status: DC

## 2022-02-04 NOTE — Addendum Note (Signed)
Addended by: Clyde Lundborg A on: 02/04/2022 09:49 AM ? ? Modules accepted: Orders ? ?

## 2022-02-04 NOTE — Patient Instructions (Addendum)
Prevnar 20 today - supposed to be final pneumonia shot unless they change guidelines again ? ?Please stop by lab before you go ?If you have mychart- we will send your results within 3 business days of [place] receiving them.  ?If you do not have mychart- we will call you about results within 5 business days of [place] receiving them.  ?*please also note that you will see labs on mychart as soon as they post. I will later go in and write notes on them- will say "notes from Dr. Yong Channel"  ? ?Recommended follow up: Return in about 6 months (around 08/07/2022) for physical or sooner if needed. ?

## 2022-02-18 DIAGNOSIS — M25561 Pain in right knee: Secondary | ICD-10-CM | POA: Diagnosis not present

## 2022-02-19 DIAGNOSIS — N950 Postmenopausal bleeding: Secondary | ICD-10-CM | POA: Diagnosis not present

## 2022-02-24 ENCOUNTER — Ambulatory Visit (HOSPITAL_COMMUNITY): Payer: Medicare PPO | Admitting: Physical Therapy

## 2022-03-05 ENCOUNTER — Telehealth (HOSPITAL_COMMUNITY): Payer: Self-pay

## 2022-03-06 ENCOUNTER — Ambulatory Visit (HOSPITAL_COMMUNITY): Payer: Medicare PPO

## 2022-03-23 ENCOUNTER — Other Ambulatory Visit: Payer: Self-pay | Admitting: Family Medicine

## 2022-03-24 NOTE — Telephone Encounter (Signed)
Pt stated she feels better, has no pain in her knees anymore. Appointment canceled per pt request  ?

## 2022-04-06 DIAGNOSIS — L309 Dermatitis, unspecified: Secondary | ICD-10-CM | POA: Diagnosis not present

## 2022-04-06 DIAGNOSIS — L570 Actinic keratosis: Secondary | ICD-10-CM | POA: Diagnosis not present

## 2022-04-11 ENCOUNTER — Encounter: Payer: Self-pay | Admitting: Family Medicine

## 2022-04-24 ENCOUNTER — Encounter: Payer: Self-pay | Admitting: Obstetrics and Gynecology

## 2022-04-24 ENCOUNTER — Ambulatory Visit: Payer: Medicare PPO | Admitting: Obstetrics and Gynecology

## 2022-04-24 DIAGNOSIS — N390 Urinary tract infection, site not specified: Secondary | ICD-10-CM | POA: Diagnosis not present

## 2022-04-24 MED ORDER — NITROFURANTOIN MONOHYD MACRO 100 MG PO CAPS: 100.0000 mg | ORAL_CAPSULE | Freq: Every day | ORAL | 5 refills | Status: DC

## 2022-04-24 NOTE — Progress Notes (Signed)
[facility] Return Visit  SUBJECTIVE  History of Present Illness: Alexis Wallace is a 66 y.o. female seen in follow-up for recurrent UTI and overactive bladder.  She has been taking macrobid '100mg'$  daily. Has not had any urinary tract infections in the last 6 months. She has not had any urgency symptoms without the infections so she stopped the trospium.  Still using vaginal estrogen twice a week. Not taking cranberry and d-mannose.   Past Medical History: Patient  has a past medical history of Allergy, Anemia, iron deficiency, B12 deficiency, CAP (community acquired pneumonia) (11/18/2014), GERD (gastroesophageal reflux disease), Heartburn, Iron deficiency, Osteoarthritis, Osteopenia, Solitary pulmonary nodule (12/05/2014), Tibial plateau fracture, and Urinary tract infection.   Past Surgical History: She  has a past surgical history that includes Gastric bypass (2001); Cosmetic surgery; carpal tunnel right; Colonoscopy; and Polypectomy.   Medications: She has a current medication list which includes the following prescription(s): calcium carbonate, cholecalciferol, cyanocobalamin, estradiol, estradiol, fluticasone, multivitamin, nitrofurantoin (macrocrystal-monohydrate), nitrofurantoin (macrocrystal-monohydrate), omeprazole, OVER THE COUNTER MEDICATION, progesterone, progesterone, temazepam, valacyclovir, vitamin d (ergocalciferol), and wegovy.   Allergies: Patient has No Known Allergies.   Social History: Patient  reports that she quit smoking about 44 years ago. Her smoking use included cigarettes. She has a 5.00 pack-year smoking history. She has never used smokeless tobacco. She reports that she does not drink alcohol and does not use drugs.      OBJECTIVE     Physical Exam: There were no vitals filed for this visit.  Gen: No apparent distress, A&O x 3.  Detailed Urogynecologic Evaluation:  Deferred.    POC urine: trace blood, otherwise  negative  ASSESSMENT AND PLAN    Alexis Wallace is a 66 y.o. with:  1. Recurrent urinary tract infection     Recurrent UTI - Continue antibiotic prophylaxis- nitrofurantoin '100mg'$  daily, refilled.  - continue estrace cream 0.5g twice a week - If she has symptoms of UTI, she should obtain a urine culture to track bacteria.   Follow up 6 months or sooner if needed  Jaquita Folds, MD

## 2022-05-08 ENCOUNTER — Other Ambulatory Visit: Payer: Self-pay | Admitting: Family Medicine

## 2022-05-19 ENCOUNTER — Other Ambulatory Visit: Payer: Self-pay | Admitting: Family Medicine

## 2022-05-25 ENCOUNTER — Encounter: Payer: Self-pay | Admitting: Family Medicine

## 2022-06-16 ENCOUNTER — Other Ambulatory Visit: Payer: Self-pay | Admitting: Family Medicine

## 2022-06-23 DIAGNOSIS — H40023 Open angle with borderline findings, high risk, bilateral: Secondary | ICD-10-CM | POA: Diagnosis not present

## 2022-06-23 DIAGNOSIS — H04123 Dry eye syndrome of bilateral lacrimal glands: Secondary | ICD-10-CM | POA: Diagnosis not present

## 2022-06-23 DIAGNOSIS — H5213 Myopia, bilateral: Secondary | ICD-10-CM | POA: Diagnosis not present

## 2022-06-23 DIAGNOSIS — H2513 Age-related nuclear cataract, bilateral: Secondary | ICD-10-CM | POA: Diagnosis not present

## 2022-07-13 ENCOUNTER — Other Ambulatory Visit: Payer: Self-pay | Admitting: Family Medicine

## 2022-08-14 DIAGNOSIS — N959 Unspecified menopausal and perimenopausal disorder: Secondary | ICD-10-CM | POA: Diagnosis not present

## 2022-08-14 DIAGNOSIS — Z760 Encounter for issue of repeat prescription: Secondary | ICD-10-CM | POA: Diagnosis not present

## 2022-08-14 DIAGNOSIS — R87610 Atypical squamous cells of undetermined significance on cytologic smear of cervix (ASC-US): Secondary | ICD-10-CM | POA: Diagnosis not present

## 2022-08-14 DIAGNOSIS — R87619 Unspecified abnormal cytological findings in specimens from cervix uteri: Secondary | ICD-10-CM | POA: Diagnosis not present

## 2022-08-19 LAB — HM PAP SMEAR: HM Pap smear: NEGATIVE

## 2022-08-28 ENCOUNTER — Encounter: Payer: Self-pay | Admitting: Family Medicine

## 2022-08-28 ENCOUNTER — Ambulatory Visit (INDEPENDENT_AMBULATORY_CARE_PROVIDER_SITE_OTHER): Payer: Medicare PPO | Admitting: Family Medicine

## 2022-08-28 VITALS — BP 118/72 | HR 59 | Temp 98.2°F | Ht 67.5 in | Wt 153.0 lb

## 2022-08-28 DIAGNOSIS — D518 Other vitamin B12 deficiency anemias: Secondary | ICD-10-CM

## 2022-08-28 DIAGNOSIS — Z23 Encounter for immunization: Secondary | ICD-10-CM | POA: Diagnosis not present

## 2022-08-28 DIAGNOSIS — E559 Vitamin D deficiency, unspecified: Secondary | ICD-10-CM

## 2022-08-28 DIAGNOSIS — Z0000 Encounter for general adult medical examination without abnormal findings: Secondary | ICD-10-CM

## 2022-08-28 DIAGNOSIS — E785 Hyperlipidemia, unspecified: Secondary | ICD-10-CM

## 2022-08-28 LAB — LIPID PANEL
Cholesterol: 162 mg/dL (ref 0–200)
HDL: 70.7 mg/dL (ref >39.00)
LDL Cholesterol: 80 mg/dL (ref 0–99)
NonHDL: 91.01
Total CHOL/HDL Ratio: 2
Triglycerides: 54 mg/dL (ref 0.0–149.0)
VLDL: 10.8 mg/dL (ref 0.0–40.0)

## 2022-08-28 LAB — COMPREHENSIVE METABOLIC PANEL
ALT: 13 U/L (ref 0–35)
AST: 16 U/L (ref 0–37)
Albumin: 3.7 g/dL (ref 3.5–5.2)
Alkaline Phosphatase: 82 U/L (ref 39–117)
BUN: 12 mg/dL (ref 6–23)
CO2: 29 mEq/L (ref 19–32)
Calcium: 8.9 mg/dL (ref 8.4–10.5)
Chloride: 104 mEq/L (ref 96–112)
Creatinine, Ser: 0.68 mg/dL (ref 0.40–1.20)
GFR: 89.34 mL/min (ref >60.00)
Glucose, Bld: 81 mg/dL (ref 70–99)
Potassium: 4.1 mEq/L (ref 3.5–5.1)
Sodium: 139 mEq/L (ref 135–145)
Total Bilirubin: 0.6 mg/dL (ref 0.2–1.2)
Total Protein: 6.5 g/dL (ref 6.0–8.3)

## 2022-08-28 LAB — CBC WITH DIFFERENTIAL/PLATELET
Basophils Absolute: 0 10*3/uL (ref 0.0–0.1)
Basophils Relative: 0.8 % (ref 0.0–3.0)
Eosinophils Absolute: 0.1 10*3/uL (ref 0.0–0.7)
Eosinophils Relative: 2.5 % (ref 0.0–5.0)
HCT: 36.2 % (ref 36.0–46.0)
Hemoglobin: 12.2 g/dL (ref 12.0–15.0)
Lymphocytes Relative: 32.6 % (ref 12.0–46.0)
Lymphs Abs: 1.5 10*3/uL (ref 0.7–4.0)
MCHC: 33.8 g/dL (ref 30.0–36.0)
MCV: 93.2 fl (ref 78.0–100.0)
Monocytes Absolute: 0.2 10*3/uL (ref 0.1–1.0)
Monocytes Relative: 4.8 % (ref 3.0–12.0)
Neutro Abs: 2.8 10*3/uL (ref 1.4–7.7)
Neutrophils Relative %: 59.3 % (ref 43.0–77.0)
Platelets: 172 10*3/uL (ref 150.0–400.0)
RBC: 3.88 Mil/uL (ref 3.87–5.11)
RDW: 12.6 % (ref 11.5–15.5)
WBC: 4.6 10*3/uL (ref 4.0–10.5)

## 2022-08-28 LAB — VITAMIN B12: Vitamin B-12: 358 pg/mL (ref 211–911)

## 2022-08-28 LAB — VITAMIN D 25 HYDROXY (VIT D DEFICIENCY, FRACTURES): VITD: 34.37 ng/mL (ref 30.00–100.00)

## 2022-08-28 NOTE — Patient Instructions (Addendum)
Health Maintenance Due  Topic Date Due   COVID-19 Vaccine (6 - Pfizer risk series) 11/29/2021  - let us know when you get this  Sign release of information at the check out desk for last mammogram with Dr. Neals/[place] office  We will call you within two weeks about your referral to ct cardiac scoring through [place].  Their phone number is [phone].  Please call them if you have not heard in 1-2 weeks  Please stop by lab before you go If you have mychart- we will send your results within 3 business days of [place] receiving them.  If you do not have mychart- we will call you about results within 5 business days of [place] receiving them.  *please also note that you will see labs on mychart as soon as they post. I will later go in and write notes on them- will say "notes from Dr. Yong Channel"   Recommended follow up: Return in about 3 months (around 11/27/2022) for followup or sooner if needed.Schedule b4 you leave. -could also do 6 months in person and message me at 3 months if weight is more stable

## 2022-08-28 NOTE — Progress Notes (Signed)
Phone [phone]   Subjective:  Patient presents today for their annual physical. Chief complaint-noted.   See problem oriented charting- ROS- full  review of systems was completed and negative  The following were reviewed and entered/updated in epic: Past Medical History:  Diagnosis Date   Allergy    Anemia, iron deficiency    B12 deficiency    CAP (community acquired pneumonia) 11/18/2014   GERD (gastroesophageal reflux disease)    Heartburn    Iron deficiency    Osteoarthritis    Osteopenia    Solitary pulmonary nodule 12/05/2014   Dx 11/19/14  3 mm > f/u 11/27/2015 > resolved, no further studies rec     Tibial plateau fracture    june 2020- murphy/wainer cared for her   Urinary tract infection    Patient Active Problem List   Diagnosis Date Noted   Recurrent UTI 04/07/2016    Priority: High   Vitamin D deficiency 08/28/2022    Priority: Medium    Hyperlipidemia 05/20/2017    Priority: Medium    History of adenomatous polyp of colon 10/12/2016    Priority: Medium    Rosacea 08/30/2014    Priority: Medium    Insomnia 08/30/2014    Priority: Medium    Bariatric surgery status 09/01/2010    Priority: Medium    DETRUSOR, 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    Priority: Medium    ANEMIA, B12 DEFICIENCY 08/15/2007    Priority: Medium    Plantar fasciitis, right 12/13/2014    Priority: Low   Solitary pulmonary nodule 12/05/2014    Priority: Low   CAP (community acquired pneumonia) 11/18/2014    Priority: Low   Family history of hypertrophic cardiomyopathy 05/13/2011    Priority: Low   Actinic keratosis 10/22/2010    Priority: Low   ALLERGIC RHINITIS 03/01/2008    Priority: Low   GERD 06/02/2007    Priority: Low   OSTEOARTHRITIS 06/02/2007    Priority: Low   OSTEOPENIA 06/02/2007    Priority: Low   COVID-19 07/05/2020   Past Surgical History:  Procedure Laterality Date   carpal tunnel right     COLONOSCOPY     COSMETIC SURGERY     tummy tuck   GASTRIC BYPASS   2001   POLYPECTOMY      Family History  Problem Relation Age of Onset   Heart disease Father    Lung cancer Mother        stage IV, smoker   Heart disease Sister        Hypertrophic cardiomyopathy   Uterine cancer Sister    Colon cancer Neg Hx    Esophageal cancer Neg Hx    Rectal cancer Neg Hx    Stomach cancer Neg Hx    Colon polyps Neg Hx     Medications- reviewed and updated Current Outpatient Medications  Medication Sig Dispense Refill   calcium carbonate 200 MG capsule Take 3,000 mg by mouth daily.     Cholecalciferol 1.25 MG (50000 UT) TABS 50,000 units PO qwk for 13 weeks. 13 tablet 0   cyanocobalamin (,VITAMIN B-12,) 1000 MCG/ML injection INJECT 1ML (1000MCG TOTAL) INTO THE MUSCLE EVERY 30 DAYS. 3 mL 3   estradiol (ESTRACE) 0.1 MG/GM vaginal cream Place 0.5 g vaginally 2 (two) times a week. Place 0.5g nightly for two weeks then twice a week after 30 g 11   fluticasone (FLONASE) 50 MCG/ACT nasal spray USE 2 SPRAYS IN EACH NOSTRIL ONCE DAILY 16 g 1   Multiple Vitamin (  MULTIVITAMIN) tablet Take 1 tablet by mouth daily.     nitrofurantoin, macrocrystal-monohydrate, (MACROBID) 100 MG capsule Take 1 capsule (100 mg total) by mouth daily. 30 capsule 5   omeprazole (PRILOSEC) 20 MG capsule TAKE ONE CAPSULE BY MOUTH DAILY 90 capsule 3   OVER THE COUNTER MEDICATION Vitamin D 3 one capsule daily.     progesterone (PROMETRIUM) 200 MG capsule TAKE ONE CAPSULE BY MOUTH DAILY AT BEDTIME.     temazepam (RESTORIL) 15 MG capsule TAKE ONE CAPSULE ('15MG'$  TOTAL) BY MOUTH AT BEDTIME AS NEEDED FOR SLEEP 30 capsule 5   valACYclovir (VALTREX) 1000 MG tablet Take 2 pills twice a day for 1 day at first sign of cold sore 30 tablet 1   Vitamin D, Ergocalciferol, (DRISDOL) 1.25 MG (50000 UNIT) CAPS capsule TAKE ONE CAPSULE (50,000 UNITS) BY MOUTHONCE A WEEK FOR 13 WEEKS 13 capsule 3   WEGOVY 2.4 MG/0.75ML SOAJ INJECT 2.'4MG'$  INTO THE SKIN ONCE A WEEK 3 mL 1   estradiol (ESTRACE) 1 MG tablet Through GYN  Dr. Runell Gess     No current facility-administered medications for this visit.    Allergies-reviewed and updated No Known Allergies  Social History   Social History Narrative   Married to [employer]. 2 kids. Jenny Reichmann (his wife had TBI being struck by car) with daughter [name]. Patient takes care of [name] and son lives with her and husband) and Colletta [place] lives in [place] with no kids.       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  Raising granddaughter after daughter has TBI.       Hobbies: spending time with friends and family          Objective  Objective:  BP 118/72   Pulse (!) 59   Temp 98.2 F (36.8 C)   Ht 5' 7.5" (1.715 m)   Wt 153 lb (69.4 kg)   SpO2 100%   BMI 23.61 kg/m  Gen: NAD, resting comfortably HEENT: Mucous membranes are moist. Oropharynx normal Neck: no thyromegaly CV: RRR no murmurs rubs or gallops Lungs: CTAB no crackles, wheeze, rhonchi Abdomen: soft/nontender/nondistended/normal bowel sounds. No rebound or guarding.  Ext: nonpitting edema Skin: warm, dry Neuro: grossly normal, moves all extremities, PERRLA   Assessment and Plan   66 y.o. female presenting for annual physical.  Health Maintenance counseling: 1. Anticipatory guidance: Patient counseled regarding regular dental exams -q6 months, eye exams - yearly,  avoiding smoking and second hand smoke , limiting alcohol to 1 beverage per day , no illicit drugs .   2. Risk factor reduction:  Advised patient of need for regular exercise and diet rich and fruits and vegetables to reduce risk of heart attack and stroke.  Exercise- goes to gym 4 days a week and has trainer 2 days a week- new gym opened in her town- workout anytime gym.  Diet/weight management-wegovy helps appetite and "food noise" reduction.  Wt Readings from Last 3 Encounters:  08/28/22 153 lb (69.4 kg)  02/04/22 168 lb 3.2 oz (76.3 kg)   10/24/21 174 lb (78.9 kg)  3. Immunizations/screenings/ancillary studies-flu shot today, COVID shot at pharmacy recommended, discussed RSV- will wait for now  Immunization History  Administered Date(s) Administered   Fluad Quad(high Dose 65+) 08/04/2019, 09/09/2020, 08/28/2022   Influenza Split 10/03/2012   Influenza Whole 09/06/2001, 10/12/2007, 09/25/2008, 09/01/2010   Influenza,inj,Quad PF,6+ Mos 08/28/2013, 08/30/2014   Influenza-Unspecified 09/21/2015, 08/07/2016,  09/24/2021   Moderna Covid-19 Vaccine Bivalent Booster 64yr & up 10/04/2021   PFIZER Comirnaty(Gray Top)Covid-19 Tri-Sucrose Vaccine 04/14/2021   PFIZER(Purple Top)SARS-COV-2 Vaccination 12/26/2019, 01/16/2020, 07/31/2020   PNEUMOCOCCAL CONJUGATE-20 02/04/2022   Pneumococcal Polysaccharide-23 08/04/2019   Td 06/07/2003   Tdap 12/10/2015   Zoster Recombinat (Shingrix) 09/25/2019, 08/01/2021  4. Cervical cancer screening- She still sees GYN now with Dr. GRunell Gess(with Dr. NNori Riisretired) prior D+C feb 2023 due to bleeding issues/spotting in the past- also had pap in September which was reassuring- had ascus previously but this was normal- we will scan in letter from Dr. GRunell Gess5. Breast cancer screening-  breast exam with Dr. NNori RiisFebruary 2023 done with GYN and mammogram -had one February 2023- we need records 6. Colon cancer screening - last done 08/22/2020 with a 7-year repeat planned 7. Skin cancer screening-[place] dermat[facility] Dr. JMartin Majestic advised regular sunscreen use. Denies worrisome, changing, or new skin lesions.  8. Birth control/STD check- postmenopausal and monogomous 9. Osteoporosis screening at 89 Last DEXA scan February 2022- now will be with Dr. GRunell Gess has agreed to continue hormone replacement -Former smoker-quit in 1970s.  No regular screenings  Status of chronic or acute concerns   #social update- plans to travel to [place] upcoming year! Keeps doing a lot of cool trips!   #Status post-bariatric  surgery S:medication: wevogy 2.4 mg.  Peak weight 213 before medication started-down 70 pounds by September 2023  A/P: now in normal range of weight- she has made such good progress is anxious to reduce or stretch out med- we opted for 3 month recheck- really want her to plateau around this weight- would want to avoid bmi under 22 likely - keep up great job with exercise  #Insomnia S: Medication: Temazepam 15 mg-knows not to drive at least 8 hours after use - rarely needing- mainly travel A/P: reasonable control- continue current meds    # B12 deficiency S: Current treatment/medication (oral vs. IM): Home injections monthly A/P: hopefully stable- update b12 today. Continue current meds for now   #Vitamin D deficiency S: Medication: Has required high-dose vitamin D in the past- on high dose every other week now A/P: hopefully stable- update vitamin D today. Continue current meds for now   #UTI prevention-on Estrace cream and estradiol tablets through urogynecology as well as chronic nitrofurantoin for UTI prophylaxis.   -has follow up next month and may stop  #Osteoporosis-followed with Dr. NNori Riis(now retired)-states on estrogen as treatment- dose was lowered by Dr. GRunell Gessbut continued  #hyperlipidemia S: Medication:None -ASCVD risk under 7.5% in 2023- at 6.1 today Lab Results  Component Value Date   CHOL 168 07/31/2021   HDL 57.90 07/31/2021   LDLCALC 95 07/31/2021   LDLDIRECT 84.0 12/10/2015   TRIG 74.0 07/31/2021   CHOLHDL 3 07/31/2021   A/P: mild elevations- update lipids with labs  -if risk above 5% still on labs plan on ct cardiac scoring- we went ahead and ordered today- can cancel if #s drastically lower with weight loss  Recommended follow up: Return in about 3 months (around 11/27/2022) for followup or sooner if needed.Schedule b4 you leave. -could also do 6 months in person and message me at 3 months if weight is more stable Future Appointments  Date Time Provider  [place] 10/23/2022  8:20 AM SJaquita Folds [facility] 11/09/2022 11:45 AM [facility] HEALTH COACH [facility]   Lab/Order associations: fasting   ICD-10-CM   1. Preventative health care  Z00.00  2. Need for immunization against influenza  Z23 Flu Vaccine QUAD High Dose(Fluad)    3. ANEMIA, B12 DEFICIENCY  D51.8     4. Hyperlipidemia, unspecified hyperlipidemia type  E78.5     5. Vitamin D deficiency  E55.9       No orders of the defined types were placed in this encounter.   Return precautions advised.  [name], MD

## 2022-09-02 ENCOUNTER — Ambulatory Visit
Admission: RE | Admit: 2022-09-02 | Discharge: 2022-09-02 | Disposition: A | Discharge status: Home / Self Care | Payer: No Typology Code available for payment source | Source: Ambulatory Visit | Attending: Family Medicine | Admitting: Family Medicine

## 2022-09-02 DIAGNOSIS — E785 Hyperlipidemia, unspecified: Secondary | ICD-10-CM

## 2022-09-02 DIAGNOSIS — I700 Atherosclerosis of aorta: Secondary | ICD-10-CM | POA: Diagnosis not present

## 2022-09-04 ENCOUNTER — Encounter: Payer: Self-pay | Admitting: Family Medicine

## 2022-09-04 ENCOUNTER — Encounter: Payer: Self-pay | Admitting: Obstetrics and Gynecology

## 2022-09-04 ENCOUNTER — Other Ambulatory Visit: Payer: Self-pay

## 2022-09-04 DIAGNOSIS — I2510 Atherosclerotic heart disease of native coronary artery without angina pectoris: Secondary | ICD-10-CM

## 2022-09-04 DIAGNOSIS — N281 Cyst of kidney, acquired: Secondary | ICD-10-CM

## 2022-09-04 MED ORDER — ROSUVASTATIN CALCIUM 20 MG PO TABS: 20.0000 mg | ORAL_TABLET | Freq: Every day | ORAL | 3 refills | Status: DC

## 2022-09-07 ENCOUNTER — Other Ambulatory Visit: Payer: Self-pay | Admitting: Family Medicine

## 2022-09-09 ENCOUNTER — Ambulatory Visit: Payer: Medicare PPO | Admitting: Family Medicine

## 2022-09-09 ENCOUNTER — Encounter: Payer: Self-pay | Admitting: Family Medicine

## 2022-09-09 VITALS — BP 108/70 | HR 62 | Temp 98.5°F | Ht 67.5 in | Wt 154.0 lb

## 2022-09-09 DIAGNOSIS — E785 Hyperlipidemia, unspecified: Secondary | ICD-10-CM | POA: Diagnosis not present

## 2022-09-09 DIAGNOSIS — I700 Atherosclerosis of aorta: Secondary | ICD-10-CM | POA: Diagnosis not present

## 2022-09-09 DIAGNOSIS — I2510 Atherosclerotic heart disease of native coronary artery without angina pectoris: Secondary | ICD-10-CM | POA: Diagnosis not present

## 2022-09-09 NOTE — Progress Notes (Signed)
Phone [phone] In person visit   Subjective:   Alexis Wallace is a 66 y.o. year old very pleasant female patient who presents for/with See problem oriented charting Chief Complaint  Patient presents with   Discuss Cardiac Scoring    Pt here to discuss cardiac scoring   Past Medical History-  Patient Active Problem List   Diagnosis Date Noted   Nonobstructive CAD 09/09/2022    Priority: High   Recurrent UTI 04/07/2016    Priority: High   Aortic atherosclerosis ([place]) 09/09/2022    Priority: Medium    Vitamin D deficiency 08/28/2022    Priority: Medium    Hyperlipidemia 05/20/2017    Priority: Medium    History of adenomatous polyp of colon 10/12/2016    Priority: Medium    Rosacea 08/30/2014    Priority: Medium    Insomnia 08/30/2014    Priority: Medium    Bariatric surgery status 09/01/2010    Priority: Medium    DETRUSOR, 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    Priority: Medium    ANEMIA, B12 DEFICIENCY 08/15/2007    Priority: Medium    Plantar fasciitis, right 12/13/2014    Priority: Low   Solitary pulmonary nodule 12/05/2014    Priority: Low   CAP (community acquired pneumonia) 11/18/2014    Priority: Low   Family history of hypertrophic cardiomyopathy 05/13/2011    Priority: Low   Actinic keratosis 10/22/2010    Priority: Low   ALLERGIC RHINITIS 03/01/2008    Priority: Low   GERD 06/02/2007    Priority: Low   OSTEOARTHRITIS 06/02/2007    Priority: Low   OSTEOPENIA 06/02/2007    Priority: Low   COVID-19 07/05/2020    Medications- reviewed and updated Current Outpatient Medications  Medication Sig Dispense Refill   calcium carbonate 200 MG capsule Take 3,000 mg by mouth daily.     Cholecalciferol 1.25 MG (50000 UT) TABS 50,000 units PO qwk for 13 weeks. 13 tablet 0   cyanocobalamin (,VITAMIN B-12,) 1000 MCG/ML injection INJECT 1ML (1000MCG TOTAL) INTO THE MUSCLE EVERY 30 DAYS. 3 mL 3   estradiol (ESTRACE) 0.1 MG/GM vaginal cream Place 0.5 g vaginally 2 (two)  times a week. Place 0.5g nightly for two weeks then twice a week after 30 g 11   estradiol (ESTRACE) 1 MG tablet Through GYN Dr. Runell Gess     fluticasone (FLONASE) 50 MCG/ACT nasal spray USE 2 SPRAYS IN EACH NOSTRIL ONCE DAILY 16 g 1   Multiple Vitamin (MULTIVITAMIN) tablet Take 1 tablet by mouth daily.     nitrofurantoin, macrocrystal-monohydrate, (MACROBID) 100 MG capsule Take 1 capsule (100 mg total) by mouth daily. 30 capsule 5   omeprazole (PRILOSEC) 20 MG capsule TAKE ONE CAPSULE BY MOUTH DAILY 90 capsule 3   progesterone (PROMETRIUM) 200 MG capsule TAKE ONE CAPSULE BY MOUTH DAILY AT BEDTIME.     rosuvastatin (CRESTOR) 20 MG tablet Take 1 tablet (20 mg total) by mouth daily. 90 tablet 3   temazepam (RESTORIL) 15 MG capsule TAKE ONE CAPSULE ('15MG'$  TOTAL) BY MOUTH AT BEDTIME AS NEEDED FOR SLEEP 30 capsule 5   valACYclovir (VALTREX) 1000 MG tablet Take 2 pills twice a day for 1 day at first sign of cold sore 30 tablet 1   Vitamin D, Ergocalciferol, (DRISDOL) 1.25 MG (50000 UNIT) CAPS capsule TAKE ONE CAPSULE (50,000 UNITS) BY MOUTHONCE A WEEK FOR 13 WEEKS 13 capsule 3   WEGOVY 2.4 MG/0.75ML SOAJ INJECT 2.'4MG'$  INTO THE SKIN ONCE A WEEK 3 mL 1  No current facility-administered medications for this visit.     Objective:  BP 108/70   Pulse 62   Temp 98.5 F (36.9 C)   Ht 5' 7.5" (1.715 m)   Wt 154 lb (69.9 kg)   SpO2 99%   BMI 23.76 kg/m  Gen: NAD, resting comfortably CV: RRR no murmurs rubs or gallops Lungs: CTAB no crackles, wheeze, rhonchi Ext: trace to 1+ edema Skin: warm, dry     Assessment and Plan   #Nonobstructive CAD-on CT cardiac scoring 09/03/2022-Coronary calcium score is 269 and this is at percentile 88  #hyperlipidemia #Aortic atherosclerosis S: Medication:Rosuvastatin 20 mg daily- only 2nd day today - First visit with Dr. Oval Linsey 10/27/2022   -Asymptomatic-no chest pain or shortness of breath reported Lab Results  Component Value Date   CHOL 162 08/28/2022    HDL 70.70 08/28/2022   [place] 80 08/28/2022   LDLDIRECT 84.0 12/10/2015   TRIG 54.0 08/28/2022   CHOLHDL 2 08/28/2022   A/P: Nonobstructive CAD based on CT calcium scoring -weight is in healthy range now but historically obese prior to bariatric surgery and even still BMI over 30 just less than 18 months -For hyperlipidemia LDL above 70 and we started high-dose statin for long-term prevention-we discussed ideal LDL goal under 70 but under 55 even better-she may not ultimately require this high of a dose and if she has side effects I am fine with her reducing to half tablet plus she will discuss further with Dr. Oval Linsey -Patient also on hormone therapy through GYN but with severe side effects when she tried to come off in the past including severe hot flashes, night sweats, muscle aches.  She is not interested in stopping at this time but is willing to discuss with cardiology their opinion -On the other hand Wegovy probably lowers cardiovascular risk and balances prediabetes risk -Discussed aortic atherosclerosis which I presume is stable and will remain stable with risk factor modification-continue statin - Did not recommend aspirin at this time unless recommended by cardiology   #Renal cyst-noted some enlargement compared to 2015 scan-scheduled for follow-up ultrasound in 2 days-we will discuss further follow-up after this  Recommended follow up: keep march appt- cancel the December one on way out Future Appointments  Date Time Provider [place]  09/11/2022  1:00 PM [facility] [place] [facility]  10/23/2022  8:20 AM Jaquita Folds, MD [facility]  10/27/2022  1:40 PM Skeet Latch, MD [facility]  11/09/2022 11:45 AM [facility]  11/20/2022  8:20 AM Marin Olp, MD [facility]  02/12/2023  2:00 PM Marin Olp, MD [facility]    Lab/Order associations:   ICD-10-CM   1. Hyperlipidemia, unspecified hyperlipidemia type  E78.5     2.  Aortic atherosclerosis (HCC)  I70.0     3. Coronary artery disease involving native coronary artery of native heart without angina pectoris  I25.10      Return precautions advised.  [name], MD

## 2022-09-09 NOTE — Patient Instructions (Addendum)
No changes today- if muscle aches can reduce to half tablet rosuvastatin plus lets see if Dr. Oval Linsey recommends lower dose   Recommended follow up: -On the other hand Wegovy probably lowers cardiovascular risk

## 2022-09-11 ENCOUNTER — Ambulatory Visit
Admission: RE | Admit: 2022-09-11 | Discharge: 2022-09-11 | Disposition: A | Discharge status: Home / Self Care | Payer: Medicare PPO | Source: Ambulatory Visit | Attending: Family Medicine | Admitting: Family Medicine

## 2022-09-11 ENCOUNTER — Encounter: Payer: Self-pay | Admitting: Family Medicine

## 2022-09-11 DIAGNOSIS — N281 Cyst of kidney, acquired: Secondary | ICD-10-CM | POA: Diagnosis not present

## 2022-09-15 ENCOUNTER — Other Ambulatory Visit: Payer: Self-pay | Admitting: Family Medicine

## 2022-09-17 ENCOUNTER — Encounter: Payer: Self-pay | Admitting: Family Medicine

## 2022-09-28 ENCOUNTER — Encounter: Payer: Self-pay | Admitting: *Deleted

## 2022-10-01 ENCOUNTER — Telehealth: Payer: Self-pay | Admitting: *Deleted

## 2022-10-01 ENCOUNTER — Encounter: Payer: Self-pay | Admitting: *Deleted

## 2022-10-01 NOTE — Patient Instructions (Signed)
Visit Information  Thank you for taking time to visit with me today. Please don't hesitate to contact me if I can be of assistance to you.   Following are the goals we discussed today:   Goals Addressed               This Visit's Progress     COMPLETED: No needs (pt-stated)        Care Coordination Interventions: Reviewed medications with patient and discussed adherence with no needed refills Reviewed scheduled/upcoming provider appointments including pending appointments Assessed social determinant of health barriers          Please call the care guide team at [phone] if you need to cancel or reschedule your appointment.   If you are experiencing a Mental Health or [place] or need someone to talk to, please call the Suicide and Crisis Lifeline: [phone]  Patient verbalizes understanding of instructions and care plan provided today and agrees to view in [place]. Active MyChart status and patient understanding of how to access instructions and care plan via MyChart confirmed with patient.     No further follow up required: no needs  Raina Mina, RN Care Management Coordinator [place] Office [phone]

## 2022-10-01 NOTE — Patient Outreach (Signed)
  Care Coordination   Initial Visit Note   10/01/2022 Name: Alexis Wallace MRN: [id number] DOB: 1956-03-18  Alexis Wallace is a 66 y.o. year old female who sees Yong Channel, Brayton Mars, MD for primary care. I spoke with  Alexis Wallace by phone today.  What matters to the patients health and wellness today?  No needs    Goals Addressed               This Visit's Progress     COMPLETED: No needs (pt-stated)        Care Coordination Interventions: Reviewed medications with patient and discussed adherence with no needed refills Reviewed scheduled/upcoming provider appointments including pending appointments Assessed social determinant of health barriers          SDOH assessments and interventions completed:  Yes  SDOH Interventions Today    Flowsheet Row Most Recent Value  SDOH Interventions   Food Insecurity Interventions Intervention Not Indicated  Housing Interventions Intervention Not Indicated  Transportation Interventions Intervention Not Indicated  Utilities Interventions Intervention Not Indicated        Care Coordination Interventions Activated:  Yes  Care Coordination Interventions:  Yes, provided   Follow up plan: No further intervention required.   Encounter Outcome:  Pt. Visit Completed   Raina Mina, RN Care Management Coordinator [place] Office [phone]

## 2022-10-19 DIAGNOSIS — L821 Other seborrheic keratosis: Secondary | ICD-10-CM | POA: Diagnosis not present

## 2022-10-19 DIAGNOSIS — L570 Actinic keratosis: Secondary | ICD-10-CM | POA: Diagnosis not present

## 2022-10-23 ENCOUNTER — Encounter: Payer: Self-pay | Admitting: Obstetrics and Gynecology

## 2022-10-23 ENCOUNTER — Ambulatory Visit: Payer: Medicare PPO | Admitting: Obstetrics and Gynecology

## 2022-10-23 DIAGNOSIS — N390 Urinary tract infection, site not specified: Secondary | ICD-10-CM

## 2022-10-23 DIAGNOSIS — N952 Postmenopausal atrophic vaginitis: Secondary | ICD-10-CM

## 2022-10-23 MED ORDER — ESTRADIOL 0.1 MG/GM VA CREA: 0.5000 g | TOPICAL_CREAM | VAGINAL | 11 refills | Status: AC

## 2022-10-23 MED ORDER — METHENAMINE HIPPURATE 1 G PO TABS: 1.0000 g | ORAL_TABLET | Freq: Two times a day (BID) | ORAL | 11 refills | Status: DC

## 2022-10-23 NOTE — Progress Notes (Signed)
[facility] Return Visit  SUBJECTIVE  History of Present Illness: Alexis Wallace is a 66 y.o. female seen in follow-up for recurrent UTI and overactive bladder.   Has not had recent UTI. Has been on daily macrobid. Still using vaginal estrogen twice a week (in addition to oral estrogen prescribed by her GYN).   Had a renal ultrasound which showed growth of a known left renal cyst, now  5.4 x 7.6 x 5.8 cm today versus 3.8 x 4.0 x 3.8 cm in 2013.   Past Medical History: Patient  has a past medical history of Allergy, Anemia, iron deficiency, B12 deficiency, CAP (community acquired pneumonia) (11/18/2014), GERD (gastroesophageal reflux disease), Heartburn, Iron deficiency, Osteoarthritis, Osteopenia, Solitary pulmonary nodule (12/05/2014), Tibial plateau fracture, and Urinary tract infection.   Past Surgical History: She  has a past surgical history that includes Gastric bypass (2001); Cosmetic surgery; carpal tunnel right; Colonoscopy; and Polypectomy.   Medications: She has a current medication list which includes the following prescription(s): calcium carbonate, cholecalciferol, cyanocobalamin, estradiol, fluticasone, methenamine, multivitamin, nitrofurantoin (macrocrystal-monohydrate), omeprazole, progesterone, rosuvastatin, temazepam, valacyclovir, vitamin d (ergocalciferol), wegovy, and [START ON 10/26/2022] estradiol.   Allergies: Patient has No Known Allergies.   Social History: Patient  reports that she quit smoking about 44 years ago. Her smoking use included cigarettes. She has a 5.00 pack-year smoking history. She has never used smokeless tobacco. She reports that she does not drink alcohol and does not use drugs.      OBJECTIVE     Physical Exam: Vitals:   10/23/22 0830  BP: 133/87  Pulse: 65  Weight: 145 lb (65.8 kg)  Height: '5\' 5"'$  (1.651 m)    Gen: No apparent distress, A&O x 3.  Detailed Urogynecologic Evaluation:  Deferred.     ASSESSMENT  AND PLAN    Alexis Wallace is a 66 y.o. with:  1. Recurrent UTI   2. Vaginal atrophy     Recurrent UTI - Stop nitrofurantoin prophylaxis once prescription is complete - continue estrace cream 0.5g twice a week- discussed that continuing with this is the most important thing for prevention of UTIs. Refill provided. - Will also prescribe methenamine twice a day for UTI prevention. She can take cranberry or probiotic OTC for prevention as well if desired.  - If she has symptoms of UTI, she should obtain a urine culture- either with PCP or urgent care  Follow up 1 year or as needed  Jaquita Folds, MD

## 2022-10-27 ENCOUNTER — Encounter (HOSPITAL_BASED_OUTPATIENT_CLINIC_OR_DEPARTMENT_OTHER): Payer: Self-pay | Admitting: Cardiovascular Disease

## 2022-10-27 ENCOUNTER — Ambulatory Visit (HOSPITAL_BASED_OUTPATIENT_CLINIC_OR_DEPARTMENT_OTHER): Payer: Medicare PPO | Admitting: Cardiovascular Disease

## 2022-10-27 VITALS — BP 120/68 | HR 63 | Ht 65.0 in | Wt 154.9 lb

## 2022-10-27 DIAGNOSIS — I2510 Atherosclerotic heart disease of native coronary artery without angina pectoris: Secondary | ICD-10-CM | POA: Diagnosis not present

## 2022-10-27 DIAGNOSIS — I700 Atherosclerosis of aorta: Secondary | ICD-10-CM | POA: Diagnosis not present

## 2022-10-27 NOTE — Assessment & Plan Note (Addendum)
Coronary CTA 08/2022 revealed a score of 269 which was 88th percentile.  She has been going to the gym 4 days/week and has no exertional symptoms.  She was appropriately started on a statin.  Her LDL goal is less than 70.  We will also check an LP(a) given that her cholesterol actually has not been that high yet she still has multiple regions of coronary calcification.  She is also doing a great job of losing weight and has responded quite well to [employer].  Recommend continuing her current diet and limiting animal products is much as possible.  Recommend adding aspirin 81 mg daily.

## 2022-10-27 NOTE — Patient Instructions (Addendum)
Medication Instructions:  START ASPIRIN 81 MG DAILY .   Labwork: FASTING [facility] SOON   Testing/Procedures: NONE  Follow-Up: AS NEEDED

## 2022-10-27 NOTE — Progress Notes (Deleted)
Cardiology Office Note   Date:  10/27/2022   ID:  Alexis Wallace, DOB [birth year], MRN [id number]  PCP:  Marin Olp, MD  Cardiologist:   Skeet Latch, MD   No chief complaint on file.     History of Present Illness: Alexis Wallace is a 66 y.o. female with aortic atherosclerosis, coronary calcification on CT, and hyperlipidemia who is being seen today for the evaluation of CAD at the request of Marin Olp, MD. she recently underwent coronary calcium scoring and was found to have a score of 269 which was 88th percentile.  She was appropriately started on a statin and referred to cardiology for further evaluation.  She previously had an echocardiogram in 2012 that revealed LVEF 55% with grade 1 diastolic dysfunction.     Past Medical History:  Diagnosis Date   Allergy    Anemia, iron deficiency    B12 deficiency    CAP (community acquired pneumonia) 11/18/2014   GERD (gastroesophageal reflux disease)    Heartburn    Iron deficiency    Osteoarthritis    Osteopenia    Solitary pulmonary nodule 12/05/2014   Dx 11/19/14  3 mm > f/u 11/27/2015 > resolved, no further studies rec     Tibial plateau fracture    june 2020- murphy/wainer cared for her   Urinary tract infection     Past Surgical History:  Procedure Laterality Date   carpal tunnel right     COLONOSCOPY     COSMETIC SURGERY     tummy tuck   GASTRIC BYPASS  2001   POLYPECTOMY       Current Outpatient Medications  Medication Sig Dispense Refill   calcium carbonate 200 MG capsule Take 3,000 mg by mouth daily.     Cholecalciferol 1.25 MG (50000 UT) TABS 50,000 units PO qwk for 13 weeks. 13 tablet 0   cyanocobalamin (VITAMIN B12) 1000 MCG/ML injection INJECT 1ML (1000MCG TOTAL) INTO THE MUSCLE EVERY 30 DAYS 3 mL 3   estradiol (ESTRACE) 0.1 MG/GM vaginal cream Place 0.5 g vaginally 2 (two) times a week. Place 0.5g nightly for two weeks then twice a week after 30 g 11   estradiol (ESTRACE)  1 MG tablet Through GYN Dr. Runell Gess     fluticasone (FLONASE) 50 MCG/ACT nasal spray USE 2 SPRAYS IN EACH NOSTRIL ONCE DAILY 16 g 1   methenamine (HIPREX) 1 g tablet Take 1 tablet (1 g total) by mouth 2 (two) times daily with a meal. 60 tablet 11   Multiple Vitamin (MULTIVITAMIN) tablet Take 1 tablet by mouth daily.     nitrofurantoin, macrocrystal-monohydrate, (MACROBID) 100 MG capsule Take 1 capsule (100 mg total) by mouth daily. 30 capsule 5   omeprazole (PRILOSEC) 20 MG capsule TAKE ONE CAPSULE BY MOUTH DAILY 90 capsule 3   progesterone (PROMETRIUM) 200 MG capsule TAKE ONE CAPSULE BY MOUTH DAILY AT BEDTIME.     rosuvastatin (CRESTOR) 20 MG tablet Take 1 tablet (20 mg total) by mouth daily. 90 tablet 3   temazepam (RESTORIL) 15 MG capsule TAKE ONE CAPSULE ('15MG'$  TOTAL) BY MOUTH AT BEDTIME AS NEEDED FOR SLEEP 30 capsule 5   valACYclovir (VALTREX) 1000 MG tablet Take 2 pills twice a day for 1 day at first sign of cold sore 30 tablet 1   Vitamin D, Ergocalciferol, (DRISDOL) 1.25 MG (50000 UNIT) CAPS capsule TAKE ONE CAPSULE (50,000 UNITS) BY MOUTHONCE A WEEK FOR 13 WEEKS 13 capsule 3   WEGOVY 2.4 MG/0.75ML  SOAJ INJECT 2.'4MG'$  INTO THE SKIN ONCE A WEEK 3 mL 1   No current facility-administered medications for this visit.    Allergies:   Patient has no known allergies.    Social History:  The patient  reports that she quit smoking about 44 years ago. Her smoking use included cigarettes. She has a 5.00 pack-year smoking history. She has never used smokeless tobacco. She reports that she does not drink alcohol and does not use drugs.   Family History:  The patient's ***family history includes Heart disease in her father and sister; Lung cancer in her mother; Uterine cancer in her sister.    ROS:  Please see the history of present illness.   Otherwise, review of systems are positive for {NONE DEFAULTED:18576}.   All other systems are reviewed and negative.    PHYSICAL EXAM: VS:  There were no  vitals taken for this visit. , BMI There is no height or weight on file to calculate BMI. GENERAL:  Well appearing HEENT:  Pupils equal round and reactive, fundi not visualized, oral mucosa unremarkable NECK:  No jugular venous distention, waveform within normal limits, carotid upstroke brisk and symmetric, no bruits, no thyromegaly LYMPHATICS:  No cervical adenopathy LUNGS:  Clear to auscultation bilaterally HEART:  RRR.  PMI not displaced or sustained,S1 and S2 within normal limits, no S3, no S4, no clicks, no rubs, *** murmurs ABD:  Flat, positive bowel sounds normal in frequency in pitch, no bruits, no rebound, no guarding, no midline pulsatile mass, no hepatomegaly, no splenomegaly EXT:  2 plus pulses throughout, no edema, no cyanosis no clubbing SKIN:  No rashes no nodules NEURO:  Cranial nerves II through XII grossly intact, motor grossly intact throughout PSYCH:  Cognitively intact, oriented to person place and time    EKG:  EKG {ACTION; IS/IS FOY:[id number]} ordered today. The ekg ordered today demonstrates ***   Recent Labs: 08/28/2022: ALT 13; BUN 12; Creatinine, Ser 0.68; Hemoglobin 12.2; Platelets 172.0; Potassium 4.1; Sodium 139    Lipid Panel    Component Value Date/Time   CHOL 162 08/28/2022 1049   TRIG 54.0 08/28/2022 1049   HDL 70.70 08/28/2022 1049   CHOLHDL 2 08/28/2022 1049   VLDL 10.8 08/28/2022 1049   LDLCALC 80 08/28/2022 1049   LDLDIRECT 84.0 12/10/2015 1008      Wt Readings from Last 3 Encounters:  10/23/22 145 lb (65.8 kg)  09/09/22 154 lb (69.9 kg)  08/28/22 153 lb (69.4 kg)      ASSESSMENT AND PLAN:  ***   Current medicines are reviewed at length with the patient today.  The patient {ACTIONS; HAS/DOES NOT HAVE:19233} concerns regarding medicines.  The following changes have been made:  {PLAN; NO CHANGE:13088:s}  Labs/ tests ordered today include: *** No orders of the defined types were placed in this encounter.    Disposition:   FU  with ***     Signed, Rovena Hearld C. Oval Linsey, MD, [facility]  10/27/2022 1:21 PM    [facility]

## 2022-10-27 NOTE — Assessment & Plan Note (Signed)
LDL goal less than 70.  Continue statin.

## 2022-10-27 NOTE — Progress Notes (Signed)
Cardiology Office Note   Date:  10/27/2022   ID:  [name], DOB 1956/07/10, MRN [id number]  PCP:  Alexis Wallace, Alexis Wallace  Cardiologist:   Alexis Latch, Alexis Wallace   No chief complaint on file.   History of Present Illness: Alexis Wallace is a 66 y.o. female with aortic atherosclerosis, coronary calcification on CT, and hyperlipidemia who is being seen today for the evaluation of CAD at the request of Alexis Wallace, Alexis Wallace. she recently underwent coronary calcium scoring and was found to have a score of 269 which was 88th percentile.  She was appropriately started on a statin and referred to cardiology for further evaluation.  She previously had an echocardiogram in 2012 that revealed LVEF 55% with grade 1 diastolic dysfunction.  Today, she has been feeling overall well. She reports that most of her adult life she has been overweight. Since May, she has been going to the gym 4 times a week.  She lost about 60 lbs since starting [facility]. She has been trying to keep a balanced diet but focuses on protein consumption like protein shakes, nuts, and beans.  She has minimal meat consumption.  She reports having smoked about a pack of cigarettes a day for about 4 years and quit in 1979. Mother had lung cancer and her father had a heart attack at 96 and passed at 21. Her sister also has a history of heart disease and has hypertrophic cardiomyopathy. She denies any palpitations, chest pain, shortness of breath, or peripheral edema. No lightheadedness, headaches, syncope, orthopnea, or PND.  Past Medical History:  Diagnosis Date   Allergy    Anemia, iron deficiency    B12 deficiency    CAP (community acquired pneumonia) 11/18/2014   Heartburn    Iron deficiency    Osteoarthritis    Osteopenia    Solitary pulmonary nodule 12/05/2014   Dx 11/19/14  3 mm > f/u 11/27/2015 > resolved, no further studies rec     Tibial plateau fracture    june 2020- murphy/wainer cared for her   Urinary  tract infection     Past Surgical History:  Procedure Laterality Date   carpal tunnel right     COLONOSCOPY     COSMETIC SURGERY     tummy tuck   GASTRIC BYPASS  2001   POLYPECTOMY       Current Outpatient Medications  Medication Sig Dispense Refill   aspirin EC 81 MG tablet Take 81 mg by mouth daily. Swallow whole.     calcium carbonate 200 MG capsule Take 3,000 mg by mouth daily.     Cholecalciferol 1.25 MG (50000 UT) TABS 50,000 units PO qwk for 13 weeks. 13 tablet 0   cyanocobalamin (VITAMIN B12) 1000 MCG/ML injection INJECT 1ML (1000MCG TOTAL) INTO THE MUSCLE EVERY 30 DAYS 3 mL 3   estradiol (ESTRACE) 0.1 MG/GM vaginal cream Place 0.5 g vaginally 2 (two) times a week. Place 0.5g nightly for two weeks then twice a week after 30 g 11   estradiol (ESTRACE) 1 MG tablet Through GYN Dr. Runell Gess     fluticasone (FLONASE) 50 MCG/ACT nasal spray USE 2 SPRAYS IN EACH NOSTRIL ONCE DAILY 16 g 1   methenamine (HIPREX) 1 g tablet Take 1 tablet (1 g total) by mouth 2 (two) times daily with a meal. 60 tablet 11   Multiple Vitamin (MULTIVITAMIN) tablet Take 1 tablet by mouth daily.     omeprazole (PRILOSEC) 20 MG capsule TAKE ONE CAPSULE  BY MOUTH DAILY 90 capsule 3   progesterone (PROMETRIUM) 200 MG capsule TAKE ONE CAPSULE BY MOUTH DAILY AT BEDTIME.     rosuvastatin (CRESTOR) 20 MG tablet Take 1 tablet (20 mg total) by mouth daily. 90 tablet 3   temazepam (RESTORIL) 15 MG capsule TAKE ONE CAPSULE ('15MG'$  TOTAL) BY MOUTH AT BEDTIME AS NEEDED FOR SLEEP 30 capsule 5   valACYclovir (VALTREX) 1000 MG tablet Take 2 pills twice a day for 1 day at first sign of cold sore 30 tablet 1   WEGOVY 2.4 MG/0.75ML SOAJ INJECT 2.'4MG'$  INTO THE SKIN ONCE A WEEK 3 mL 1   No current facility-administered medications for this visit.    Allergies:   Patient has no known allergies.    Social History:  The patient  reports that she quit smoking about 44 years ago. Her smoking use included cigarettes. She has a 5.00  pack-year smoking history. She has never used smokeless tobacco. She reports that she does not drink alcohol and does not use drugs.   Family History:  The patient's family history includes Heart attack (age of onset: 66) in her father; Heart disease in her father and sister; Hypertrophic cardiomyopathy in her sister; Lung cancer in her mother; Uterine cancer in her sister.    ROS:  Please see the history of present illness.     Otherwise, review of systems are positive for none. All other systems are reviewed and negative.    PHYSICAL EXAM: VS:  BP 120/68 (BP Location: Left Arm, Patient Position: Sitting, Cuff Size: Normal)   Pulse 63   Ht '5\' 5"'$  (1.651 m)   Wt 154 lb 14.4 oz (70.3 kg)   BMI 25.78 kg/m  , BMI Body mass index is 25.78 kg/m. GENERAL:  Well appearing HEENT:  Pupils equal round and reactive, fundi not visualized, oral mucosa unremarkable NECK:  No jugular venous distention, waveform within normal limits, carotid upstroke brisk and symmetric, no bruits, no thyromegaly LUNGS:  Clear to auscultation bilaterally HEART:  RRR.  PMI not displaced or sustained,S1 and S2 within normal limits, no S3, no S4, no clicks, no rubs, no murmurs ABD:  Flat, positive bowel sounds normal in frequency in pitch, no bruits, no rebound, no guarding, no midline pulsatile mass, no hepatomegaly, no splenomegaly EXT:  2 plus pulses throughout, no edema, no cyanosis no clubbing SKIN:  No rashes no nodules NEURO:  Cranial nerves II through XII grossly intact, motor grossly intact throughout PSYCH:  Cognitively intact, oriented to person place and time   EKG:  EKG is personally reviewed.  10/27/2022: Sinus rhythm. Rate 63 bpm. Low voltage  Recent Labs: 08/28/2022: ALT 13; BUN 12; Creatinine, Ser 0.68; Hemoglobin 12.2; Platelets 172.0; Potassium 4.1; Sodium 139    Lipid Panel    Component Value Date/Time   CHOL 162 08/28/2022 1049   TRIG 54.0 08/28/2022 1049   HDL 70.70 08/28/2022 1049    CHOLHDL 2 08/28/2022 1049   VLDL 10.8 08/28/2022 1049   LDLCALC 80 08/28/2022 1049   LDLDIRECT 84.0 12/10/2015 1008      Wt Readings from Last 3 Encounters:  10/27/22 154 lb 14.4 oz (70.3 kg)  10/23/22 145 lb (65.8 kg)  09/09/22 154 lb (69.9 kg)      ASSESSMENT AND PLAN:  Nonobstructive CAD Coronary CTA 08/2022 revealed a score of 269 which was 88th percentile.  She has been going to the gym 4 days/week and has no exertional symptoms.  She was appropriately started on a  statin.  Her LDL goal is less than 70.  We will also check an LP(a) given that her cholesterol actually has not been that high yet she still has multiple regions of coronary calcification.  She is also doing a great job of losing weight and has responded quite well to [employer].  Recommend continuing her current diet and limiting animal products is much as possible.  Recommend adding aspirin 81 mg daily.  Aortic atherosclerosis (HCC) LDL goal less than 70.  Continue statin.  Current medicines are reviewed at length with the patient today.  The patient does not have concerns regarding medicines.  The following changes have been made:  no change  Labs/ tests ordered today include:   Orders Placed This Encounter  Procedures   Lipoprotein A (LPA)   EKG 12-Lead     Disposition:    FU with Ericha Whittingham C. Oval Linsey, Alexis Wallace, [facility] as needed.    I,Rachel Rivera,acting as a Education administrator for Alexis Latch, Alexis Wallace.,have documented all relevant documentation on the behalf of Alexis Latch, Alexis Wallace,as directed by  Alexis Latch, Alexis Wallace while in the presence of Alexis Latch, Alexis Wallace.  I, [place] Oval Linsey, Alexis Wallace have reviewed all documentation for this visit.  The documentation of the exam, diagnosis, procedures, and orders on 10/27/2022 are all accurate and complete.    Signed, Gian Ybarra C. Oval Linsey, Alexis Wallace, [facility]  10/27/2022 2:46 PM    [place]

## 2022-10-28 DIAGNOSIS — M25562 Pain in left knee: Secondary | ICD-10-CM | POA: Diagnosis not present

## 2022-11-03 DIAGNOSIS — I2510 Atherosclerotic heart disease of native coronary artery without angina pectoris: Secondary | ICD-10-CM | POA: Diagnosis not present

## 2022-11-04 LAB — LIPOPROTEIN A (LPA): Lipoprotein (a): 10.2 nmol/L (ref <75.0)

## 2022-11-06 ENCOUNTER — Other Ambulatory Visit: Payer: Self-pay | Admitting: Family Medicine

## 2022-11-09 ENCOUNTER — Ambulatory Visit (INDEPENDENT_AMBULATORY_CARE_PROVIDER_SITE_OTHER): Payer: Medicare PPO

## 2022-11-09 VITALS — Wt 154.0 lb

## 2022-11-09 DIAGNOSIS — Z0000 Encounter for general adult medical examination without abnormal findings: Secondary | ICD-10-CM

## 2022-11-09 NOTE — Progress Notes (Signed)
I connected with  Clayborne Artist on 11/09/22 by a audio enabled telemedicine application and verified that I am speaking with the correct person using two identifiers.  Patient Location: Home  Provider Location: Office/Clinic  I discussed the limitations of evaluation and management by telemedicine. The patient expressed understanding and agreed to proceed.   Subjective:   Alexis Wallace is a 66 y.o. female who presents for Medicare Annual (Subsequent) preventive examination.  Review of Systems     Cardiac Risk Factors include: advanced age (>79mn, >>32women);hypertension     Objective:    Today's Vitals   11/09/22 1143  Weight: 154 lb (69.9 kg)   Body mass index is 25.63 kg/m.     11/09/2022   11:47 AM 10/28/2021   11:50 AM 10/24/2020    8:53 AM 10/02/2016    7:34 AM 09/15/2016    8:24 AM 09/13/2016    5:09 AM  Advanced Directives  Does Patient Have a Medical Advance Directive? Yes Yes Yes Yes Yes No  Type of [place]amed[place]of [place] will Healthcare Power of Attorney Living will;Healthcare Power of [place] BeachLiving will HCrescentLiving will   Does patient want to make changes to medical advance directive? No - Patient declined       Copy of [place] Chart? Yes - validated most recent copy scanned in chart (See row information) Yes - validated most recent copy scanned in chart (See row information) No - copy requested No - copy requested      Current Medications (verified) Outpatient Encounter Medications as of 11/09/2022  Medication Sig   aspirin EC 81 MG tablet Take 81 mg by mouth daily. Swallow whole.   calcium carbonate 200 MG capsule Take 3,000 mg by mouth daily.   Cholecalciferol 1.25 MG (50000 UT) TABS 50,000 units PO qwk for 13 weeks.   cyanocobalamin (VITAMIN B12) 1000 MCG/ML injection INJECT 1ML (1000MCG TOTAL) INTO THE MUSCLE EVERY 30 DAYS   estradiol  (ESTRACE) 0.1 MG/GM vaginal cream Place 0.5 g vaginally 2 (two) times a week. Place 0.5g nightly for two weeks then twice a week after   estradiol (ESTRACE) 1 MG tablet Through GYN Dr. GRunell Gess  fluticasone ([facility] 50 MCG/ACT nasal spray USE 2 SPRAYS IN EACH NOSTRIL ONCE DAILY   methenamine (HIPREX) 1 g tablet Take 1 tablet (1 g total) by mouth 2 (two) times daily with a meal.   Multiple Vitamin (MULTIVITAMIN) tablet Take 1 tablet by mouth daily.   omeprazole (PRILOSEC) 20 MG capsule TAKE ONE CAPSULE BY MOUTH DAILY   progesterone (PROMETRIUM) 200 MG capsule TAKE ONE CAPSULE BY MOUTH DAILY AT BEDTIME.   rosuvastatin (CRESTOR) 20 MG tablet Take 1 tablet (20 mg total) by mouth daily.   temazepam (RESTORIL) 15 MG capsule TAKE ONE CAPSULE ('15MG'$  TOTAL) BY MOUTH AT BEDTIME AS NEEDED FOR SLEEP   WEGOVY 2.4 MG/0.75ML SOAJ INJECT 2.'4MG'$  INTO THE SKIN ONCE A WEEK   valACYclovir (VALTREX) 1000 MG tablet Take 2 pills twice a day for 1 day at first sign of cold sore (Patient not taking: Reported on 11/09/2022)   No facility-administered encounter medications on file as of 11/09/2022.    Allergies (verified) Patient has no known allergies.   History: Past Medical History:  Diagnosis Date   Allergy    Anemia, iron deficiency    B12 deficiency    CAP (community acquired pneumonia) 11/18/2014   Heartburn    Iron  deficiency    Osteoarthritis    Osteopenia    Solitary pulmonary nodule 12/05/2014   Dx 11/19/14  3 mm > f/u 11/27/2015 > resolved, no further studies rec     Tibial plateau fracture    june 2020- murphy/wainer cared for her   Urinary tract infection    Past Surgical History:  Procedure Laterality Date   carpal tunnel right     COLONOSCOPY     COSMETIC SURGERY     tummy tuck   GASTRIC BYPASS  2001   POLYPECTOMY     Family History  Problem Relation Age of Onset   Lung cancer Mother        stage IV, smoker   Heart attack Father 72   Heart disease Father        started 51 cad-  died at 63   Heart disease Sister        Hypertrophic cardiomyopathy   Uterine cancer Sister    Hypertrophic cardiomyopathy Sister    Colon cancer Neg Hx    Esophageal cancer Neg Hx    Rectal cancer Neg Hx    Stomach cancer Neg Hx    Colon polyps Neg Hx    Social History   Socioeconomic History   Marital status: Married    Spouse name: Not on file   Number of children: 2   Years of education: Not on file   Highest education level: Not on file  Occupational History   Not on file  Tobacco Use   Smoking status: Former    Packs/day: 1.00    Years: 5.00    Total pack years: 5.00    Types: Cigarettes    Quit date: 02/10/1978    Years since quitting: 44.7   Smokeless tobacco: Never  Vaping Use   Vaping Use: Never used  Substance and Sexual Activity   Alcohol use: No   Drug use: No   Sexual activity: Not Currently    Birth control/protection: Post-menopausal  Other Topics Concern   Not on file  Social History Narrative   Married to [employer]. 2 kids. Jenny Reichmann (his wife had TBI being struck by car) with daughter [name]. Patient takes care of [name] and son lives with her and husband) and Colletta [place] lives in [place] with no kids.       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  Raising granddaughter after daughter has TBI.       Hobbies: spending time with friends and family          Social Determinants of Health   Financial Resource Strain: Low Risk  (11/09/2022)   Overall Financial Resource Strain (CARDIA)    Difficulty of Paying Living Expenses: Not hard at all  Food Insecurity: No Food Insecurity (11/09/2022)   Hunger Vital Sign    Worried About Running Out of Food in the Last Year: Never true    Ran Out of Food in the Last Year: Never true  Transportation Needs: No Transportation Needs (11/09/2022)   PRAPARE - Hydrologist (Medical): No    Lack of  Transportation (Non-Medical): No  Physical Activity: Sufficiently Active (11/09/2022)   Exercise Vital Sign    Days of Exercise per Week: 4 days    Minutes of Exercise per Session: 40 min  Stress: No Stress Concern Present (11/09/2022)   [place] -  Occupational Stress Questionnaire    Feeling of Stress : Not at all  Social Connections: Moderately Integrated (11/09/2022)   Social Connection and Isolation Panel [NHANES]    Frequency of Communication with Friends and Family: More than three times a week    Frequency of Social Gatherings with Friends and Family: More than three times a week    Attends Religious Services: Never    Marine scientist or Organizations: Yes    Attends Music therapist: 1 to 4 times per year    Marital Status: Married    Tobacco Counseling Counseling given: Not Answered   Clinical Intake:  Pre-visit preparation completed: Yes  Pain : No/denies pain     BMI - recorded: 25.63 Nutritional Status: BMI 25 -29 Overweight Nutritional Risks: None Diabetes: No  How often do you need to have someone help you when you read instructions, pamphlets, or other written materials from your doctor or pharmacy?: 1 - Never  Diabetic?no  Interpreter Needed?: No  Information entered by :: Charlott Rakes, LPN   Activities of Daily Living    11/09/2022   11:48 AM 11/05/2022    9:04 AM  In your present state of health, do you have any difficulty performing the following activities:  Hearing? 0 0  Vision? 0 0  Difficulty concentrating or making decisions? 0 0  Walking or climbing stairs? 0 0  Dressing or bathing? 0 0  Doing errands, shopping? 0 0  Preparing Food and eating ? N N  Using the Toilet? N N  In the past six months, have you accidently leaked urine? N N  Do you have problems with loss of bowel control? N N  Managing your Medications? N N  Managing your Finances? N N  Housekeeping or managing your  Housekeeping? N N    Patient Care Team: Marin Olp, MD as PCP - General (Family Medicine)  Indicate any recent Medical Services you may have received from other than [facility] providers in the past year (date may be approximate).     Assessment:   This is a routine wellness examination for [place].  Hearing/Vision screen Hearing Screening - Comments:: Pt denies any hearing issues  Vision Screening - Comments:: Pt follows up with Dr Valetta Close for annul eye exams   Dietary issues and exercise activities discussed: Current Exercise Habits: Home exercise routine, Type of exercise: walking;Other - see comments, Time (Minutes): 45, Frequency (Times/Week): 4, Weekly Exercise (Minutes/Week): 180   Goals Addressed             This Visit's Progress    Patient Stated       Continue working out at the gym        Depression Screen    11/09/2022   11:46 AM 10/28/2021   11:35 AM 07/31/2021    2:48 PM 02/24/2021    2:57 PM 10/24/2020    8:52 AM 04/26/2020   10:10 AM 08/04/2019    8:40 AM  PHQ 2/9 Scores  PHQ - 2 Score 0 0 0 0 0 0 0  PHQ- 9 Score      0     Fall Risk    11/09/2022   11:48 AM 11/05/2022    9:04 AM 10/28/2021   11:36 AM 07/31/2021    2:48 PM 10/24/2020    8:55 AM  Fall Risk   Falls in the past year? 0 0 0 0 0  Number falls in past yr: 0 0 0 0 0  Injury with Fall? 0 0 0 0 0  Risk for fall due to : Impaired vision  Impaired vision No Fall Risks Impaired vision  Follow up Falls prevention discussed  Falls prevention discussed Falls evaluation completed Falls prevention discussed    FALL RISK PREVENTION PERTAINING TO THE HOME:  Any stairs in or around the home? Yes  If so, are there any without handrails? No  Home free of loose throw rugs in walkways, pet beds, electrical cords, etc? Yes  Adequate lighting in your home to reduce risk of falls? Yes   ASSISTIVE DEVICES UTILIZED TO PREVENT FALLS:  Life alert? No  Use of a cane, walker or w/c? No  Grab bars in  the bathroom? No  Shower chair or bench in shower? No  Elevated toilet seat or a handicapped toilet? No   TIMED UP AND GO:  Was the test performed? No .   Cognitive Function:        11/09/2022   11:49 AM 10/28/2021   11:37 AM  6CIT Screen  What Year? 0 points 0 points  What month? 0 points 0 points  What time? 0 points 0 points  Count back from 20 0 points 0 points  Months in reverse 0 points 0 points  Repeat phrase 0 points 0 points  Total Score 0 points 0 points    Immunizations Immunization History  Administered Date(s) Administered   Fluad Quad(high Dose 65+) 08/04/2019, 09/09/2020, 08/28/2022   Influenza Split 10/03/2012   Influenza Whole 09/06/2001, 10/12/2007, 09/25/2008, 09/01/2010   Influenza,inj,Quad PF,6+ Mos 08/28/2013, 08/30/2014   Influenza-Unspecified 09/21/2015, 08/07/2016, 09/24/2021   Moderna Covid-19 Vaccine Bivalent Booster 66yr & up 10/04/2021   Moderna SARS-COV2 Booster Vaccination 09/16/2022   PFIZER Comirnaty(Gray Top)Covid-19 Tri-Sucrose Vaccine 04/14/2021   PFIZER(Purple Top)SARS-COV-2 Vaccination 12/26/2019, 01/16/2020, 07/31/2020   PNEUMOCOCCAL CONJUGATE-20 02/04/2022   Pneumococcal Polysaccharide-23 08/04/2019   Respiratory Syncytial Virus Vaccine,Recomb Aduvanted(Arexvy) 09/16/2022   Td 06/07/2003   Tdap 12/10/2015   Zoster Recombinat (Shingrix) 09/25/2019, 08/01/2021    TDAP status: Up to date  Flu Vaccine status: Up to date  Pneumococcal vaccine status: Up to date  Covid-19 vaccine status: Completed vaccines  Qualifies for Shingles Vaccine? Yes   Zostavax completed Yes   Shingrix Completed?: Yes  Screening Tests Health Maintenance  Topic Date Due   COVID-19 Vaccine (6 - 2023-24 season) 11/11/2022   MAMMOGRAM  01/17/2023   Medicare Annual Wellness (AWV)  11/10/2023   DTaP/Tdap/Td (3 - Td or Tdap) 12/09/2025   COLONOSCOPY (Pts 45-413yrInsurance coverage will need to be confirmed)  08/23/2027   Pneumonia Vaccine 65+ Years  old  Completed   INFLUENZA VACCINE  Completed   DEXA SCAN  Completed   Hepatitis C Screening  Completed   Zoster Vaccines- Shingrix  Completed   HPV VACCINES  Aged Out    Health Maintenance  Health Maintenance Due  Topic Date Due   COVID-19 Vaccine (6 - 2023-24 season) 11/11/2022    Colorectal cancer screening: Type of screening: Colonoscopy. Completed 08/22/20. Repeat every 7 years  Mammogram status: Completed 01/17/21. Repeat every year pt scheduled for 01/2023  Bone Density status: Completed 01/17/21. Results reflect: Bone density results: OSTEOPENIA. Repeat every 2 years.    Additional Screening:  Hepatitis C Screening:  Completed 12/10/15  Vision Screening: Recommended annual ophthalmology exams for early detection of glaucoma and other disorders of the eye. Is the patient up to date with their annual eye exam?  Yes  Who is the provider or what  is the name of the office in which the patient attends annual eye exams? Dr Valetta Close  If pt is not established with a provider, would they like to be referred to a provider to establish care? No .   Dental Screening: Recommended annual dental exams for proper oral hygiene  Community Resource Referral / Chronic Care Management: CRR required this visit?  No   CCM required this visit?  No      Plan:     I have personally reviewed and noted the following in the patient's chart:   Medical and social history Use of alcohol, tobacco or illicit drugs  Current medications and supplements including opioid prescriptions. Patient is not currently taking opioid prescriptions. Functional ability and status Nutritional status Physical activity Advanced directives List of other physicians Hospitalizations, surgeries, and ER visits in previous 12 months Vitals Screenings to include cognitive, depression, and falls Referrals and appointments  In addition, I have reviewed and discussed with patient certain preventive protocols, quality  metrics, and best practice recommendations. A written personalized care plan for preventive services as well as general preventive health recommendations were provided to patient.     Willette Brace, LPN   [id number]   Nurse Notes: none

## 2022-11-09 NOTE — Patient Instructions (Signed)
Alexis Wallace , Thank you for taking time to come for your Medicare Wellness Visit. I appreciate your ongoing commitment to your health goals. Please review the following plan we discussed and let me know if I can assist you in the future.   These are the goals we discussed:  Goals      Patient Stated     Lose weight      Patient Stated     Lose weight      Patient Stated     Continue working out at the gym         This is a list of the screening recommended for you and due dates:  Health Maintenance  Topic Date Due   COVID-19 Vaccine (6 - 2023-24 season) 11/11/2022   Mammogram  01/17/2023   Medicare Annual Wellness Visit  11/10/2023   DTaP/Tdap/Td vaccine (3 - Td or Tdap) 12/09/2025   Colon Cancer Screening  08/23/2027   Pneumonia Vaccine  Completed   Flu Shot  Completed   DEXA scan (bone density measurement)  Completed   Hepatitis C Screening: USPSTF Recommendation to screen - Ages 48-79 yo.  Completed   Zoster (Shingles) Vaccine  Completed   HPV Vaccine  Aged Out    Advanced directives: copies in chart   Conditions/risks identified: continue to work out at the gym 4 days a week   Next appointment: Follow up in one year for your annual wellness visit    Preventive Care 65 Years and Older, Female Preventive care refers to lifestyle choices and visits with your health care provider that can promote health and wellness. What does preventive care include? A yearly physical exam. This is also called an annual well check. Dental exams once or twice a year. Routine eye exams. Ask your health care provider how often you should have your eyes checked. Personal lifestyle choices, including: Daily care of your teeth and gums. Regular physical activity. Eating a healthy diet. Avoiding tobacco and drug use. Limiting alcohol use. Practicing safe sex. Taking low-dose aspirin every day. Taking vitamin and mineral supplements as recommended by your health care provider. What  happens during an annual well check? The services and screenings done by your health care provider during your annual well check will depend on your age, overall health, lifestyle risk factors, and family history of disease. Counseling  Your health care provider may ask you questions about your: Alcohol use. Tobacco use. Drug use. Emotional well-being. Home and relationship well-being. Sexual activity. Eating habits. History of falls. Memory and ability to understand (cognition). Work and work Statistician. Reproductive health. Screening  You may have the following tests or measurements: Height, weight, and BMI. Blood pressure. Lipid and cholesterol levels. These may be checked every 5 years, or more frequently if you are over 35 years old. Skin check. Lung cancer screening. You may have this screening every year starting at age 32 if you have a 30-pack-year history of smoking and currently smoke or have quit within the past 15 years. Fecal occult blood test (FOBT) of the stool. You may have this test every year starting at age 23. Flexible sigmoidoscopy or colonoscopy. You may have a sigmoidoscopy every 5 years or a colonoscopy every 10 years starting at age 45. Hepatitis C blood test. Hepatitis B blood test. Sexually transmitted disease (STD) testing. Diabetes screening. This is done by checking your blood sugar (glucose) after you have not eaten for a while (fasting). You may have this done every 1-3 years.  Bone density scan. This is done to screen for osteoporosis. You may have this done starting at age 41. Mammogram. This may be done every 1-2 years. Talk to your health care provider about how often you should have regular mammograms. Talk with your health care provider about your test results, treatment options, and if necessary, the need for more tests. Vaccines  Your health care provider may recommend certain vaccines, such as: Influenza vaccine. This is recommended every  year. Tetanus, diphtheria, and acellular pertussis (Tdap, Td) vaccine. You may need a Td booster every 10 years. Zoster vaccine. You may need this after age 73. Pneumococcal 13-valent conjugate (PCV13) vaccine. One dose is recommended after age 62. Pneumococcal polysaccharide (PPSV23) vaccine. One dose is recommended after age 7. Talk to your health care provider about which screenings and vaccines you need and how often you need them. This information is not intended to replace advice given to you by your health care provider. Make sure you discuss any questions you have with your health care provider. Document Released: 12/20/2015 Document Revised: 08/12/2016 Document Reviewed: 09/24/2015 Elsevier Interactive Patient Education  2017 [place] Prevention in the Home Falls can cause injuries. They can happen to people of all ages. There are many things you can do to make your home safe and to help prevent falls. What can I do on the outside of my home? Regularly fix the edges of walkways and driveways and fix any cracks. Remove anything that might make you trip as you walk through a door, such as a raised step or threshold. Trim any bushes or trees on the path to your home. Use bright outdoor lighting. Clear any walking paths of anything that might make someone trip, such as rocks or tools. Regularly check to see if handrails are loose or broken. Make sure that both sides of any steps have handrails. Any raised decks and porches should have guardrails on the edges. Have any leaves, snow, or ice cleared regularly. Use sand or salt on walking paths during winter. Clean up any spills in your garage right away. This includes oil or grease spills. What can I do in the bathroom? Use night lights. Install grab bars by the toilet and in the tub and shower. Do not use towel bars as grab bars. Use non-skid mats or decals in the tub or shower. If you need to sit down in the shower, use a  plastic, non-slip stool. Keep the floor dry. Clean up any water that spills on the floor as soon as it happens. Remove soap buildup in the tub or shower regularly. Attach bath mats securely with double-sided non-slip rug tape. Do not have throw rugs and other things on the floor that can make you trip. What can I do in the bedroom? Use night lights. Make sure that you have a light by your bed that is easy to reach. Do not use any sheets or blankets that are too big for your bed. They should not hang down onto the floor. Have a firm chair that has side arms. You can use this for support while you get dressed. Do not have throw rugs and other things on the floor that can make you trip. What can I do in the kitchen? Clean up any spills right away. Avoid walking on wet floors. Keep items that you use a lot in easy-to-reach places. If you need to reach something above you, use a strong step stool that has a grab bar.  Keep electrical cords out of the way. Do not use floor polish or wax that makes floors slippery. If you must use wax, use non-skid floor wax. Do not have throw rugs and other things on the floor that can make you trip. What can I do with my stairs? Do not leave any items on the stairs. Make sure that there are handrails on both sides of the stairs and use them. Fix handrails that are broken or loose. Make sure that handrails are as long as the stairways. Check any carpeting to make sure that it is firmly attached to the stairs. Fix any carpet that is loose or worn. Avoid having throw rugs at the top or bottom of the stairs. If you do have throw rugs, attach them to the floor with carpet tape. Make sure that you have a light switch at the top of the stairs and the bottom of the stairs. If you do not have them, ask someone to add them for you. What else can I do to help prevent falls? Wear shoes that: Do not have high heels. Have rubber bottoms. Are comfortable and fit you  well. Are closed at the toe. Do not wear sandals. If you use a stepladder: Make sure that it is fully opened. Do not climb a closed stepladder. Make sure that both sides of the stepladder are locked into place. Ask someone to hold it for you, if possible. Clearly mark and make sure that you can see: Any grab bars or handrails. First and last steps. Where the edge of each step is. Use tools that help you move around (mobility aids) if they are needed. These include: Canes. Walkers. Scooters. Crutches. Turn on the lights when you go into a dark area. Replace any light bulbs as soon as they burn out. Set up your furniture so you have a clear path. Avoid moving your furniture around. If any of your floors are uneven, fix them. If there are any pets around you, be aware of where they are. Review your medicines with your doctor. Some medicines can make you feel dizzy. This can increase your chance of falling. Ask your doctor what other things that you can do to help prevent falls. This information is not intended to replace advice given to you by your health care provider. Make sure you discuss any questions you have with your health care provider. Document Released: 09/19/2009 Document Revised: 04/30/2016 Document Reviewed: 12/28/2014 Elsevier Interactive Patient Education  2017 [employer].

## 2022-11-20 ENCOUNTER — Ambulatory Visit: Payer: Medicare PPO | Admitting: Family Medicine

## 2022-12-15 DIAGNOSIS — H04123 Dry eye syndrome of bilateral lacrimal glands: Secondary | ICD-10-CM | POA: Diagnosis not present

## 2022-12-30 ENCOUNTER — Other Ambulatory Visit: Payer: Self-pay | Admitting: Family Medicine

## 2022-12-31 ENCOUNTER — Other Ambulatory Visit (HOSPITAL_BASED_OUTPATIENT_CLINIC_OR_DEPARTMENT_OTHER): Payer: Self-pay

## 2022-12-31 ENCOUNTER — Encounter: Payer: Self-pay | Admitting: Family Medicine

## 2022-12-31 MED FILL — Semaglutide (Weight Mngmt) Soln Auto-Injector 2.4 MG/0.75ML: SUBCUTANEOUS | 28 days supply | Qty: 3 | Fill #0 | Status: AC

## 2023-01-01 ENCOUNTER — Other Ambulatory Visit (HOSPITAL_BASED_OUTPATIENT_CLINIC_OR_DEPARTMENT_OTHER): Payer: Self-pay

## 2023-01-01 ENCOUNTER — Other Ambulatory Visit: Payer: Self-pay

## 2023-01-01 ENCOUNTER — Other Ambulatory Visit (HOSPITAL_COMMUNITY): Payer: Self-pay

## 2023-01-01 MED ORDER — WEGOVY 2.4 MG/0.75ML ~~LOC~~ SOAJ
2.4000 mg | SUBCUTANEOUS | 2 refills | Status: DC
  Filled 2023-01-01: qty 9, 84d supply, fill #0
  Filled 2023-01-24: qty 3, 28d supply, fill #0
  Filled 2023-01-26: qty 9, 84d supply, fill #0

## 2023-01-11 ENCOUNTER — Other Ambulatory Visit: Payer: Self-pay | Admitting: Family Medicine

## 2023-01-11 MED ORDER — TEMAZEPAM 15 MG PO CAPS: ORAL_CAPSULE | ORAL | 3 refills | Status: DC

## 2023-01-25 ENCOUNTER — Other Ambulatory Visit: Payer: Self-pay

## 2023-01-25 ENCOUNTER — Other Ambulatory Visit (HOSPITAL_COMMUNITY): Payer: Self-pay

## 2023-01-26 ENCOUNTER — Other Ambulatory Visit (HOSPITAL_BASED_OUTPATIENT_CLINIC_OR_DEPARTMENT_OTHER): Payer: Self-pay

## 2023-01-26 ENCOUNTER — Other Ambulatory Visit (HOSPITAL_COMMUNITY): Payer: Self-pay

## 2023-01-29 ENCOUNTER — Ambulatory Visit: Payer: Medicare PPO | Admitting: Podiatry

## 2023-01-29 ENCOUNTER — Telehealth: Payer: Self-pay | Admitting: *Deleted

## 2023-01-29 DIAGNOSIS — M79675 Pain in left toe(s): Secondary | ICD-10-CM

## 2023-01-29 DIAGNOSIS — L600 Ingrowing nail: Secondary | ICD-10-CM

## 2023-01-29 MED ORDER — CEPHALEXIN 500 MG PO CAPS: 500.0000 mg | ORAL_CAPSULE | Freq: Three times a day (TID) | ORAL | 0 refills | Status: DC

## 2023-01-29 NOTE — Telephone Encounter (Signed)
Patient is asking that her AVS/instructions  be sent to her Mychart address.

## 2023-01-29 NOTE — Progress Notes (Signed)
Subjective: Chief Complaint  Patient presents with   Ingrown Toenail    left great toenail ingrown has come back. Top of the nail. No pus or bleeding. Soreness to medial border.     67 year old female presents the office today with the above concerns.  She states the left medial elbow that she points she is causing discomfort, ongoing for some time.  She had tried changing her shoes because of this as well as the size.  No drainage or pus.  Objective: AAO x3, NAD DP/PT pulses palpable bilaterally, CRT less than 3 seconds Incurvation present to left medial nail border and there is localized edema there is no erythema, drainage pus or any signs of infection.  No ascending cellulitis.  Nails mildly hypertrophic, dystrophic with yellow discoloration on the corner. No pain with calf compression, swelling, warmth, erythema  Assessment: Left medial hallux ingrown toenail  Plan: -All treatment options discussed with the patient including all alternatives, risks, complications.  -At this time, the patient is requesting partial nail removal with chemical matricectomy to the symptomatic portion of the nail. Risks and complications were discussed with the patient for which they understand and written consent was obtained. Under sterile conditions a total of 3 mL of a mixture of 2% lidocaine plain and 0.5% Marcaine plain was infiltrated in a hallux block fashion. Once anesthetized, the skin was prepped in sterile fashion. A tourniquet was then applied. Next the medial aspect of hallux nail border was then sharply excised making sure to remove the entire offending nail border. Once the nails were ensured to be removed area was debrided and the underlying skin was intact. There is no purulence identified in the procedure. Next phenol was then applied under standard conditions and copiously irrigated. silvadene was applied. A dry sterile dressing was applied. After application of the dressing the tourniquet was  removed and there is found to be an immediate capillary refill time to the digit. The patient tolerated the procedure well any complications. Post procedure instructions were discussed the patient for which he verbally understood. Discussed signs/symptoms of infection and directed to call the office immediately should any occur or go directly to the emergency room. In the meantime, encouraged to call the office with any questions, concerns, changes symptoms. -Keflex -Patient encouraged to call the office with any questions, concerns, change in symptoms.     Trula Slade DPM

## 2023-01-29 NOTE — Patient Instructions (Signed)

## 2023-02-01 ENCOUNTER — Encounter: Payer: Self-pay | Admitting: Podiatry

## 2023-02-01 NOTE — Telephone Encounter (Signed)
Left detailed message letting patient know that the AVS has been updated in her mychart,.

## 2023-02-09 DIAGNOSIS — H04123 Dry eye syndrome of bilateral lacrimal glands: Secondary | ICD-10-CM | POA: Diagnosis not present

## 2023-02-12 ENCOUNTER — Ambulatory Visit (INDEPENDENT_AMBULATORY_CARE_PROVIDER_SITE_OTHER): Payer: Medicare PPO | Admitting: *Deleted

## 2023-02-12 ENCOUNTER — Encounter: Payer: Self-pay | Admitting: Family Medicine

## 2023-02-12 ENCOUNTER — Ambulatory Visit: Payer: Medicare PPO | Admitting: Family Medicine

## 2023-02-12 VITALS — BP 120/60 | HR 67 | Temp 97.3°F | Ht 65.0 in | Wt 153.0 lb

## 2023-02-12 DIAGNOSIS — F5101 Primary insomnia: Secondary | ICD-10-CM | POA: Diagnosis not present

## 2023-02-12 DIAGNOSIS — I700 Atherosclerosis of aorta: Secondary | ICD-10-CM

## 2023-02-12 DIAGNOSIS — E785 Hyperlipidemia, unspecified: Secondary | ICD-10-CM | POA: Diagnosis not present

## 2023-02-12 DIAGNOSIS — Z9889 Other specified postprocedural states: Secondary | ICD-10-CM

## 2023-02-12 DIAGNOSIS — E559 Vitamin D deficiency, unspecified: Secondary | ICD-10-CM

## 2023-02-12 DIAGNOSIS — I2510 Atherosclerotic heart disease of native coronary artery without angina pectoris: Secondary | ICD-10-CM

## 2023-02-12 DIAGNOSIS — L600 Ingrowing nail: Secondary | ICD-10-CM

## 2023-02-12 DIAGNOSIS — D518 Other vitamin B12 deficiency anemias: Secondary | ICD-10-CM

## 2023-02-12 MED ORDER — CYANOCOBALAMIN 1000 MCG/ML IJ SOLN: INTRAMUSCULAR | 3 refills | Status: DC

## 2023-02-12 MED ORDER — CHOLECALCIFEROL 1.25 MG (50000 UT) PO TABS: ORAL_TABLET | ORAL | 1 refills | Status: DC

## 2023-02-12 NOTE — Progress Notes (Signed)
Patient presents today for nail check of the hallux left - medial border. Ingrown toenail procedure was performed on 01/29/23 by Dr. Jacqualyn Posey.  Patient states she soaked and covered for about a week. She isn't having any pain.   Well healing toe s/p matrixectomy hallux left. There is no drainage or signs of infection.   Patient will follow up as needed.

## 2023-02-12 NOTE — Patient Instructions (Addendum)
Have GYN send [place] mammogram   Schedule a lab visit at the check out desk within 2 weeks. Return for future fasting labs meaning nothing but water after midnight please. Ok to take your medications with water. Can do on 21st but need to schedule   Recommended follow up: Return in about 6 months (around 08/15/2023) for physical or sooner if needed.Schedule b4 you leave.

## 2023-02-12 NOTE — Progress Notes (Signed)
Phone [phone] In person visit   Subjective:   Alexis Wallace is a 67 y.o. year old very pleasant female patient who presents for/with See problem oriented charting Chief Complaint  Patient presents with   Follow-up    (No mask)   Hyperlipidemia   numbness in fingers    Pt c/o fingers turning white/numb and thinks it may be Raynauds   Past Medical History-  Patient Active Problem List   Diagnosis Date Noted   Nonobstructive CAD 09/09/2022    Priority: High   Recurrent UTI 04/07/2016    Priority: High   Aortic atherosclerosis ([place]) 09/09/2022    Priority: Medium    Vitamin D deficiency 08/28/2022    Priority: Medium    Hyperlipidemia 05/20/2017    Priority: Medium    History of adenomatous polyp of colon 10/12/2016    Priority: Medium    Rosacea 08/30/2014    Priority: Medium    Insomnia 08/30/2014    Priority: Medium    Bariatric surgery status 09/01/2010    Priority: Medium    DETRUSOR, 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    Priority: Medium    ANEMIA, B12 DEFICIENCY 08/15/2007    Priority: Medium    Plantar fasciitis, right 12/13/2014    Priority: Low   Solitary pulmonary nodule 12/05/2014    Priority: Low   CAP (community acquired pneumonia) 11/18/2014    Priority: Low   Family history of hypertrophic cardiomyopathy 05/13/2011    Priority: Low   Actinic keratosis 10/22/2010    Priority: Low   ALLERGIC RHINITIS 03/01/2008    Priority: Low   GERD 06/02/2007    Priority: Low   OSTEOARTHRITIS 06/02/2007    Priority: Low   OSTEOPENIA 06/02/2007    Priority: Low   COVID-19 07/05/2020    Medications- reviewed and updated Current Outpatient Medications  Medication Sig Dispense Refill   aspirin EC 81 MG tablet Take 81 mg by mouth daily. Swallow whole.     calcium carbonate 200 MG capsule Take 3,000 mg by mouth daily.     cephALEXin (KEFLEX) 500 MG capsule Take 1 capsule (500 mg total) by mouth 3 (three) times daily. 21 capsule 0   estradiol (ESTRACE) 0.1 MG/GM  vaginal cream Place 0.5 g vaginally 2 (two) times a week. Place 0.5g nightly for two weeks then twice a week after 30 g 11   estradiol (ESTRACE) 1 MG tablet Through GYN Dr. Runell Gess     fluticasone (FLONASE) 50 MCG/ACT nasal spray USE 2 SPRAYS IN EACH NOSTRIL ONCE DAILY 16 g 1   methenamine (HIPREX) 1 g tablet Take 1 tablet (1 g total) by mouth 2 (two) times daily with a meal. 60 tablet 11   Multiple Vitamin (MULTIVITAMIN) tablet Take 1 tablet by mouth daily.     omeprazole (PRILOSEC) 20 MG capsule TAKE ONE CAPSULE BY MOUTH DAILY 90 capsule 3   progesterone (PROMETRIUM) 200 MG capsule TAKE ONE CAPSULE BY MOUTH DAILY AT BEDTIME.     rosuvastatin (CRESTOR) 20 MG tablet Take 1 tablet (20 mg total) by mouth daily. 90 tablet 3   Semaglutide-Weight Management (WEGOVY) 2.4 MG/0.75ML SOAJ Inject 2.4 mg into the skin every 7 (seven) days. 9 mL 2   temazepam (RESTORIL) 15 MG capsule TAKE ONE CAPSULE ('15MG'$  TOTAL) BY MOUTH AT BEDTIME AS NEEDED FOR SLEEP 30 capsule 3   valACYclovir (VALTREX) 1000 MG tablet Take 2 pills twice a day for 1 day at first sign of cold sore 30 tablet 1   Cholecalciferol 1.25  MG (50000 UT) TABS 50,000 units PO qwk for 13 weeks. 13 tablet 1   cyanocobalamin (VITAMIN B12) 1000 MCG/ML injection INJECT 1ML (1000MCG TOTAL) INTO THE MUSCLE EVERY 30 DAYS 3 mL 3   No current facility-administered medications for this visit.     Objective:  BP 120/60   Pulse 67   Temp (!) 97.3 F (36.3 C)   Ht '5\' 5"'$  (1.651 m)   Wt 153 lb (69.4 kg)   SpO2 99%   BMI 25.46 kg/m  Gen: NAD, resting comfortably CV: RRR no murmurs rubs or gallops Lungs: CTAB no crackles, wheeze, rhonchi Abdomen: soft/nontender/nondistended/normal bowel sounds. No rebound or guarding.  Ext: no edema Skin: warm, dry     Assessment and Plan   # Color change to fingers S: Patient complains of fingers turning white/numb at times.  Concerned about Raynaud's- several family members.  Reports this started in recent years.  May notice in cold grocery store for instance- gloves are helpful. Any finger but most commonly finger 2-4 on both hands  A/P: I do suspect raynauds - keeping hands warm is a reasonable first step- hopefully will improve in summer months/warmer months  #Nonobstructive CAD-on CT cardiac scoring 09/03/2022-Coronary calcium score is 269 and this is at percentile 88  #hyperlipidemia #Aortic atherosclerosis S: Medication:Rosuvastatin 20 mg daily - First visit with Dr. Oval Linsey 10/27/2022 - reports was released No chest pain or shortness of breath  Lab Results  Component Value Date   CHOL 162 08/28/2022   HDL 70.70 08/28/2022   LDLCALC 80 08/28/2022   LDLDIRECT 84.0 12/10/2015   TRIG 54.0 08/28/2022   CHOLHDL 2 08/28/2022  A/P: nonobstructive CAD remains asymptomatic - continue current medications with statin. Mancel Parsons also has benefit for her with CV risk reduction - but see below about coverage Lipid goal LDL under 70- update lipid panel on 21st when she will be back by here and can do labs- continue current medications for now  Aortic atherosclerosis (presumed stable)- LDL goal ideally <70- continue current medications   #Status post-bariatric surgery S:medication: wevogy 2.4 mg.  Peak weight 213 before medication started-down 70 pounds by September 2023- and maintained through march. Eliminates food noise for her which is a big deal  -working with trainer A/P: she is doing incredibly well but discouraged about insurance not covering medicine- she is going to try to space her doses potentially to ever 2 weeks which would stretch 2.5 month supply to 5 months at least  -Encouraged need for healthy eating, regular exercise- as tools to maintain even without medicine- I am confident she can maintain close to where she is but needs to overcome some mental barriers and believe she can maintain change  #Insomnia S: Medication: Temazepam 15 mg-knows not to drive at least 8 hours after use - rarely  needing- mainly travel other than needing lately with stress about wegovy A/P: reasonably stable- continue current medications     # B12 deficiency S: Current treatment/medication (oral vs. IM): Home injections monthly Lab Results  Component Value Date   VITAMINB12 358 08/28/2022  A/P: hopefully stable- update b12. Continue current meds for now    #Vitamin D deficiency S: Medication: Has required high-dose vitamin D in the past- still on high dose every other week Last vitamin D Lab Results  Component Value Date   VD25OH 34.37 08/28/2022  A/P: hopefully stable- update vitamin D. Continue current meds for now   Recommended follow up: Return in about 6 months (around 08/15/2023) for  physical or sooner if needed.Schedule b4 you leave. Future Appointments  Date Time Provider [place]  10/22/2023  9:20 AM Jaquita Folds, MD [facility]  11/15/2023 11:30 AM [facility]    Lab/Order associations:   ICD-10-CM   1. Other vitamin B12 deficiency anemia  D51.8 Vitamin B12    2. Hyperlipidemia, unspecified hyperlipidemia type  E78.5 Comprehensive metabolic panel    CBC with Differential/Platelet    Lipid panel    3. Vitamin D deficiency  E55.9 Vitamin D (25 hydroxy)    4. Coronary artery disease involving native coronary artery of native heart without angina pectoris  I25.10     5. Aortic atherosclerosis (HCC)  I70.0     6. Primary insomnia  F51.01       Meds ordered this encounter  Medications   cyanocobalamin (VITAMIN B12) 1000 MCG/ML injection    Sig: INJECT 1ML (1000MCG TOTAL) INTO THE MUSCLE EVERY 30 DAYS    Dispense:  3 mL    Refill:  3   Cholecalciferol 1.25 MG (50000 UT) TABS    Sig: 50,000 units PO qwk for 13 weeks.    Dispense:  13 tablet    Refill:  1    Return precautions advised.  [name], MD

## 2023-02-18 DIAGNOSIS — Z124 Encounter for screening for malignant neoplasm of cervix: Secondary | ICD-10-CM | POA: Diagnosis not present

## 2023-02-18 DIAGNOSIS — N959 Unspecified menopausal and perimenopausal disorder: Secondary | ICD-10-CM | POA: Diagnosis not present

## 2023-02-18 DIAGNOSIS — Z6824 Body mass index (BMI) 24.0-24.9, adult: Secondary | ICD-10-CM | POA: Diagnosis not present

## 2023-02-18 DIAGNOSIS — Z1231 Encounter for screening mammogram for malignant neoplasm of breast: Secondary | ICD-10-CM | POA: Diagnosis not present

## 2023-02-18 LAB — HM MAMMOGRAPHY

## 2023-02-19 LAB — HM PAP SMEAR: HM Pap smear: NEGATIVE

## 2023-02-25 ENCOUNTER — Other Ambulatory Visit (INDEPENDENT_AMBULATORY_CARE_PROVIDER_SITE_OTHER): Payer: Medicare PPO

## 2023-02-25 ENCOUNTER — Other Ambulatory Visit: Payer: Self-pay | Admitting: Family Medicine

## 2023-02-25 DIAGNOSIS — E559 Vitamin D deficiency, unspecified: Secondary | ICD-10-CM | POA: Diagnosis not present

## 2023-02-25 DIAGNOSIS — D518 Other vitamin B12 deficiency anemias: Secondary | ICD-10-CM | POA: Diagnosis not present

## 2023-02-25 DIAGNOSIS — E785 Hyperlipidemia, unspecified: Secondary | ICD-10-CM

## 2023-02-25 LAB — COMPREHENSIVE METABOLIC PANEL
ALT: 22 U/L (ref 0–35)
AST: 24 U/L (ref 0–37)
Albumin: 3.8 g/dL (ref 3.5–5.2)
Alkaline Phosphatase: 71 U/L (ref 39–117)
BUN: 10 mg/dL (ref 6–23)
CO2: 28 mEq/L (ref 19–32)
Calcium: 9.1 mg/dL (ref 8.4–10.5)
Chloride: 106 mEq/L (ref 96–112)
Creatinine, Ser: 0.67 mg/dL (ref 0.40–1.20)
GFR: 89.35 mL/min (ref >60.00)
Glucose, Bld: 84 mg/dL (ref 70–99)
Potassium: 4 mEq/L (ref 3.5–5.1)
Sodium: 140 mEq/L (ref 135–145)
Total Bilirubin: 0.6 mg/dL (ref 0.2–1.2)
Total Protein: 6.1 g/dL (ref 6.0–8.3)

## 2023-02-25 LAB — CBC WITH DIFFERENTIAL/PLATELET
Basophils Absolute: 0.1 10*3/uL (ref 0.0–0.1)
Basophils Relative: 1.2 % (ref 0.0–3.0)
Eosinophils Absolute: 0.3 10*3/uL (ref 0.0–0.7)
Eosinophils Relative: 5.2 % — ABNORMAL HIGH (ref 0.0–5.0)
HCT: 36.4 % (ref 36.0–46.0)
Hemoglobin: 12.1 g/dL (ref 12.0–15.0)
Lymphocytes Relative: 31.1 % (ref 12.0–46.0)
Lymphs Abs: 1.6 10*3/uL (ref 0.7–4.0)
MCHC: 33.3 g/dL (ref 30.0–36.0)
MCV: 93.2 fl (ref 78.0–100.0)
Monocytes Absolute: 0.3 10*3/uL (ref 0.1–1.0)
Monocytes Relative: 5 % (ref 3.0–12.0)
Neutro Abs: 2.9 10*3/uL (ref 1.4–7.7)
Neutrophils Relative %: 57.5 % (ref 43.0–77.0)
Platelets: 180 10*3/uL (ref 150.0–400.0)
RBC: 3.91 Mil/uL (ref 3.87–5.11)
RDW: 12.9 % (ref 11.5–15.5)
WBC: 5.1 10*3/uL (ref 4.0–10.5)

## 2023-02-25 LAB — LIPID PANEL
Cholesterol: 109 mg/dL (ref 0–200)
HDL: 66.3 mg/dL (ref >39.00)
LDL Cholesterol: 34 mg/dL (ref 0–99)
NonHDL: 43.19
Total CHOL/HDL Ratio: 2
Triglycerides: 46 mg/dL (ref 0.0–149.0)
VLDL: 9.2 mg/dL (ref 0.0–40.0)

## 2023-02-25 MED ORDER — WEGOVY 2.4 MG/0.75ML ~~LOC~~ SOAJ: 2.4000 mg | SUBCUTANEOUS | 11 refills | Status: DC

## 2023-02-25 NOTE — Progress Notes (Signed)
Print rx as she desires to price shop at pharmacy

## 2023-02-26 ENCOUNTER — Encounter: Payer: Self-pay | Admitting: Family Medicine

## 2023-02-26 LAB — VITAMIN B12: Vitamin B-12: 436 pg/mL (ref 211–911)

## 2023-02-26 LAB — VITAMIN D 25 HYDROXY (VIT D DEFICIENCY, FRACTURES): VITD: 26.23 ng/mL — ABNORMAL LOW (ref 30.00–100.00)

## 2023-03-02 ENCOUNTER — Other Ambulatory Visit (HOSPITAL_BASED_OUTPATIENT_CLINIC_OR_DEPARTMENT_OTHER): Payer: Self-pay

## 2023-03-02 MED ORDER — PROGESTERONE 200 MG PO CAPS
200.0000 mg | ORAL_CAPSULE | Freq: Every day | ORAL | 3 refills | Status: DC
  Filled 2023-03-02: qty 90, 90d supply, fill #0
  Filled 2023-06-15: qty 90, 90d supply, fill #1

## 2023-03-04 ENCOUNTER — Encounter: Payer: Self-pay | Admitting: Emergency Medicine

## 2023-03-04 ENCOUNTER — Ambulatory Visit
Admission: EM | Admit: 2023-03-04 | Discharge: 2023-03-04 | Disposition: A | Discharge status: Home / Self Care | Payer: Medicare PPO | Attending: Nurse Practitioner | Admitting: Nurse Practitioner

## 2023-03-04 ENCOUNTER — Encounter: Payer: Self-pay | Admitting: Obstetrics and Gynecology

## 2023-03-04 DIAGNOSIS — Z87440 Personal history of urinary (tract) infections: Secondary | ICD-10-CM | POA: Insufficient documentation

## 2023-03-04 DIAGNOSIS — N3000 Acute cystitis without hematuria: Secondary | ICD-10-CM | POA: Insufficient documentation

## 2023-03-04 LAB — POCT URINALYSIS DIP (MANUAL ENTRY)
Glucose, UA: 250 mg/dL — AB
Nitrite, UA: POSITIVE — AB
Protein Ur, POC: 100 mg/dL — AB
Spec Grav, UA: <=1.005 — AB (ref 1.010–1.025)
Urobilinogen, UA: 4 E.U./dL — AB
pH, UA: 5 (ref 5.0–8.0)

## 2023-03-04 MED ORDER — SULFAMETHOXAZOLE-TRIMETHOPRIM 800-160 MG PO TABS: 1.0000 | ORAL_TABLET | Freq: Two times a day (BID) | ORAL | 0 refills | Status: DC

## 2023-03-04 NOTE — ED Provider Notes (Signed)
[facility]    CSN: [id number] Arrival date & time: 03/04/23  [id number]      History   Chief Complaint No chief complaint on file.   HPI Alexis Wallace is a 67 y.o. female.   The history is provided by the patient.   The patient presents for complaints of urinary urgency, frequency, right-sided flank pain, been present for the past 3 days.  She denies fever, abdominal pain, nausea, vomiting, dysuria, hematuria, decreased urine flow, foul-smelling urine, or vaginal symptoms.  Patient states that she does have a history of recurrent urinary tract infections.  She states she was on Macrobid for the past several months as prophylaxis for urinary tract infections. She states she has been off the [place] for the past 6 months.   He has not had a UTI within the past 3 months.  She states that she was being followed by urogynecology.  She reports that she did take Azo for her symptoms this morning because of worsening.  Past Medical History:  Diagnosis Date   Allergy    Anemia, iron deficiency    B12 deficiency    CAP (community acquired pneumonia) 11/18/2014   Heartburn    Iron deficiency    Osteoarthritis    Osteopenia    Solitary pulmonary nodule 12/05/2014   Dx 11/19/14  3 mm > f/u 11/27/2015 > resolved, no further studies rec     Tibial plateau fracture    june 2020- murphy/wainer cared for her   Urinary tract infection     Patient Active Problem List   Diagnosis Date Noted   Aortic atherosclerosis ([place]) 09/09/2022   Nonobstructive CAD 09/09/2022   Vitamin D deficiency 08/28/2022   COVID-19 07/05/2020   Hyperlipidemia 05/20/2017   History of adenomatous polyp of colon 10/12/2016   Recurrent UTI 04/07/2016   Plantar fasciitis, right 12/13/2014   Solitary pulmonary nodule 12/05/2014   CAP (community acquired pneumonia) 11/18/2014   Rosacea 08/30/2014   Insomnia 08/30/2014   Family history of hypertrophic cardiomyopathy 05/13/2011   Actinic keratosis  10/22/2010   Bariatric surgery status 09/01/2010   DETRUSOR, 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   ALLERGIC RHINITIS 03/01/2008   ANEMIA, B12 DEFICIENCY 08/15/2007   GERD 06/02/2007   OSTEOARTHRITIS 06/02/2007   OSTEOPENIA 06/02/2007    Past Surgical History:  Procedure Laterality Date   carpal tunnel right     COLONOSCOPY     COSMETIC SURGERY     tummy tuck   GASTRIC BYPASS  2001   POLYPECTOMY      OB History     Gravida  2   Para  2   Term  2   Preterm      AB      Living  2      SAB      IAB      Ectopic      Multiple      Live Births  2            Home Medications    Prior to Admission medications   Medication Sig Start Date End Date Taking? Authorizing Provider  sulfamethoxazole-trimethoprim (BACTRIM DS) 800-160 MG tablet Take 1 tablet by mouth 2 (two) times daily for 7 days. 03/04/23 03/11/23 Yes Jonah Nestle-Warren, Alda Lea, NP  aspirin EC 81 MG tablet Take 81 mg by mouth daily. Swallow whole.    [provider]  calcium carbonate 200 MG capsule Take 3,000 mg by mouth daily.    [provider]  cephALEXin (KEFLEX) 500 MG capsule Take 1 capsule (500 mg total) by mouth 3 (three) times daily. 01/29/23   Trula Slade, DPM  Cholecalciferol 1.25 MG (50000 UT) TABS 50,000 units PO qwk for 13 weeks. 02/12/23   Marin Olp, MD  cyanocobalamin (VITAMIN B12) 1000 MCG/ML injection INJECT 1ML (1000MCG TOTAL) INTO THE MUSCLE EVERY 30 DAYS 02/12/23   Marin Olp, MD  estradiol (ESTRACE) 0.1 MG/GM vaginal cream Place 0.5 g vaginally 2 (two) times a week. Place 0.5g nightly for two weeks then twice a week after 10/26/22   Jaquita Folds, MD  estradiol (ESTRACE) 1 MG tablet Through GYN Dr. Runell Gess    [provider]  fluticasone [facility]) 50 MCG/ACT nasal spray USE 2 SPRAYS IN [facility] NOSTRIL ONCE DAILY 01/29/20   Marin Olp, MD  methenamine (HIPREX) 1 g tablet Take 1 tablet (1 g total) by mouth 2 (two) times daily with a meal.  10/23/22   Jaquita Folds, MD  Multiple Vitamin (MULTIVITAMIN) tablet Take 1 tablet by mouth daily.    [provider]  omeprazole (PRILOSEC) 20 MG capsule TAKE ONE CAPSULE BY MOUTH DAILY 05/08/22   Marin Olp, MD  progesterone (PROMETRIUM) 200 MG capsule TAKE ONE CAPSULE BY MOUTH DAILY AT BEDTIME. 12/09/20   [provider]  progesterone (PROMETRIUM) 200 MG capsule Take 1 capsule (200 mg total) by mouth at bedtime. 08/17/22     rosuvastatin (CRESTOR) 20 MG tablet Take 1 tablet (20 mg total) by mouth daily. 09/04/22   Marin Olp, MD  Semaglutide-Weight Management (WEGOVY) 2.4 MG/0.75ML SOAJ Inject 2.4 mg into the skin every 7 (seven) days. 02/25/23   Marin Olp, MD  temazepam (RESTORIL) 15 MG capsule TAKE ONE CAPSULE (15MG  TOTAL) BY MOUTH AT BEDTIME AS NEEDED FOR SLEEP 01/11/23   Marin Olp, MD  valACYclovir (VALTREX) 1000 MG tablet Take 2 pills twice a day for 1 day at first sign of cold sore 08/29/19   Marin Olp, MD    Family History Family History  Problem Relation Age of Onset   Lung cancer Mother        stage IV, smoker   Heart attack Father 20   Heart disease Father        started 57 cad- died at 55   Heart disease Sister        Hypertrophic cardiomyopathy   Uterine cancer Sister    Hypertrophic cardiomyopathy Sister    Colon cancer Neg Hx    Esophageal cancer Neg Hx    Rectal cancer Neg Hx    Stomach cancer Neg Hx    Colon polyps Neg Hx     Social History Social History   Tobacco Use   Smoking status: Former    Packs/day: 1.00    Years: 5.00    Additional pack years: 0.00    Total pack years: 5.00    Types: Cigarettes    Quit date: 02/10/1978    Years since quitting: 45.0   Smokeless tobacco: Never  Vaping Use   Vaping Use: Never used  Substance Use Topics   Alcohol use: No   Drug use: No     Allergies   Patient has no known allergies.   Review of Systems Review of Systems Per HPI  Physical Exam Triage  Vital Signs ED Triage Vitals  Enc Vitals Group     BP 03/04/23 0921 (!) 160/71     Pulse Rate 03/04/23 0921 (!) 59  Resp 03/04/23 0921 18     Temp 03/04/23 0921 (!) 97.5 F (36.4 C)     Temp Source 03/04/23 0921 Oral     SpO2 03/04/23 0921 99 %     Weight --      Height --      Head Circumference --      Peak Flow --      Pain Score 03/04/23 0922 5     Pain Loc --      Pain Edu? --      Excl. in [place]? --    No data found.  Updated Vital Signs BP (!) 160/71 (BP Location: Right Arm)   Pulse (!) 59   Temp (!) 97.5 F (36.4 C) (Oral)   Resp 18   SpO2 99%   Visual Acuity Right Eye Distance:   Left Eye Distance:   Bilateral Distance:    Right Eye Near:   Left Eye Near:    Bilateral Near:     Physical Exam Vitals and nursing note reviewed.  Constitutional:      General: She is not in acute distress.    Appearance: Normal appearance. She is well-developed.  HENT:     Head: Normocephalic.     Mouth/Throat:     Mouth: Mucous membranes are moist.  Eyes:     Extraocular Movements: Extraocular movements intact.     Conjunctiva/sclera: Conjunctivae normal.     Pupils: Pupils are equal, round, and reactive to light.  Cardiovascular:     Rate and Rhythm: Normal rate and regular rhythm.     Pulses: Normal pulses.     Heart sounds: Normal heart sounds.  Pulmonary:     Effort: Pulmonary effort is normal.     Breath sounds: Normal breath sounds.  Abdominal:     General: Bowel sounds are normal. There is no distension.     Palpations: Abdomen is soft. There is no mass.     Tenderness: There is abdominal tenderness in the right lower quadrant. There is right CVA tenderness. There is no left CVA tenderness, guarding or rebound.     Hernia: No hernia is present.  Genitourinary:    Vagina: Normal. No vaginal discharge.  Musculoskeletal:     Cervical back: Normal range of motion.  Skin:    General: Skin is warm and dry.     Findings: No erythema or rash.  Neurological:      General: No focal deficit present.     Mental Status: She is alert and oriented to person, place, and time.     Cranial Nerves: No cranial nerve deficit.  Psychiatric:        Mood and Affect: Mood normal.        Behavior: Behavior normal.      UC Treatments / Results  Labs (all labs ordered are listed, but only abnormal results are displayed) Labs Reviewed  POCT URINALYSIS DIP (MANUAL ENTRY) - Abnormal; Notable for the following components:      Result Value   Color, UA orange (*)    Glucose, UA =250 (*)    Bilirubin, UA moderate (*)    Ketones, POC UA small (15) (*)    Spec Grav, UA <=1.005 (*)    Blood, UA moderate (*)    Protein Ur, POC =100 (*)    Urobilinogen, UA 4.0 (*)    Nitrite, UA Positive (*)    Leukocytes, UA Large (3+) (*)    All other components within normal limits  URINE CULTURE    EKG   Radiology No results found.  Procedures Procedures (including critical care time)  Medications Ordered in UC Medications - No data to display  Initial Impression / Assessment and Plan / UC Course  I have reviewed the triage vital signs and the nursing notes.  Pertinent labs & imaging results that were available during my care of the patient were reviewed by me and considered in my medical decision making (see chart for details).  The patient is well-appearing, she is in no acute distress, she is hypertensive, but vital signs are otherwise stable.  Urinalysis is positive for urinary tract infection.  Patient with large leukocytes and positive nitrates.  Urine culture is pending.  On exam, patient does have right CVA tenderness.  To cover patient for acute cystitis versus pyelonephritis, will treat with Bactrim DS 800/160 mg for the next 7 days.  Supportive care recommendations were provided to the patient to include increase her water intake, Tylenol or ibuprofen for pain or discomfort, and continuing Azo as needed for urinary discomfort.  Patient was given strict ER  follow-up precautions.  Patient was also advised to follow-up with urogynecology for reevaluation.  Patient is in agreement with this plan of care and verbalizes understanding.  All questions were answered.  Patient stable for discharge.   Final Clinical Impressions(s) / UC Diagnoses   Final diagnoses:  Acute cystitis without hematuria  History of recurrent UTI (urinary tract infection)     Discharge Instructions      Your urinalysis shows that you do have a urinary tract infection.  As discussed, urine culture has been ordered.  If the results of the culture show that you need to change the antibiotic prescribed today, you will be contacted. Take medication as prescribed.  You can continue Azo as needed for the next 3 days. Increase your water intake.  Try to drink at least 8-10 8 ounce glasses of water daily while symptoms persist.  You can also try cranberry juice to see if this relieves her symptoms. May take over-the-counter Tylenol as needed for pain, fever, general discomfort. Try to avoid caffeine such as tea, soda, or coffee, as this can irritate the lining of your bladder. Develop a toileting schedule that allows you to pee at least every 2 hours. If you develop worsening symptoms while on the antibiotic including fever, chills, nausea, vomiting, diarrhea, or other concerns, please follow-up in the emergency department immediately. Please follow-up with urogynecology for reevaluation. Follow-up as needed.    ED Prescriptions     Medication Sig Dispense Auth. Provider   sulfamethoxazole-trimethoprim (BACTRIM DS) 800-160 MG tablet Take 1 tablet by mouth 2 (two) times daily for 7 days. 14 tablet Alexis Wallace, Alda Lea, NP      PDMP not reviewed this encounter.   Tish Men, NP 03/04/23 [phone]

## 2023-03-04 NOTE — Discharge Instructions (Signed)
Your urinalysis shows that you do have a urinary tract infection.  As discussed, urine culture has been ordered.  If the results of the culture show that you need to change the antibiotic prescribed today, you will be contacted. Take medication as prescribed.  You can continue Azo as needed for the next 3 days. Increase your water intake.  Try to drink at least 8-10 8 ounce glasses of water daily while symptoms persist.  You can also try cranberry juice to see if this relieves her symptoms. May take over-the-counter Tylenol as needed for pain, fever, general discomfort. Try to avoid caffeine such as tea, soda, or coffee, as this can irritate the lining of your bladder. Develop a toileting schedule that allows you to pee at least every 2 hours. If you develop worsening symptoms while on the antibiotic including fever, chills, nausea, vomiting, diarrhea, or other concerns, please follow-up in the emergency department immediately. Please follow-up with urogynecology for reevaluation. Follow-up as needed.

## 2023-03-04 NOTE — ED Triage Notes (Signed)
Urinary frequency and urinary urgency x 3 days.  Lower back pain on right side.  Has taken AZO this morning.

## 2023-03-05 LAB — URINE CULTURE: Culture: NO GROWTH

## 2023-03-08 ENCOUNTER — Telehealth: Payer: Self-pay | Admitting: Family Medicine

## 2023-03-08 ENCOUNTER — Ambulatory Visit: Payer: Medicare PPO | Admitting: Family Medicine

## 2023-03-08 ENCOUNTER — Encounter: Payer: Self-pay | Admitting: Family Medicine

## 2023-03-08 VITALS — BP 124/70 | HR 64 | Temp 97.2°F | Ht 65.0 in | Wt 152.7 lb

## 2023-03-08 DIAGNOSIS — E119 Type 2 diabetes mellitus without complications: Secondary | ICD-10-CM | POA: Diagnosis not present

## 2023-03-08 DIAGNOSIS — I2510 Atherosclerotic heart disease of native coronary artery without angina pectoris: Secondary | ICD-10-CM

## 2023-03-08 DIAGNOSIS — E785 Hyperlipidemia, unspecified: Secondary | ICD-10-CM | POA: Diagnosis not present

## 2023-03-08 DIAGNOSIS — N390 Urinary tract infection, site not specified: Secondary | ICD-10-CM

## 2023-03-08 LAB — MICROALBUMIN / CREATININE URINE RATIO
Creatinine,U: 48.4 mg/dL
Microalb Creat Ratio: 1.4 mg/g (ref 0.0–30.0)
Microalb, Ur: <0.7 mg/dL (ref 0.0–1.9)

## 2023-03-08 LAB — HEMOGLOBIN A1C: Hgb A1c MFr Bld: 5.1 % (ref 4.6–6.5)

## 2023-03-08 MED ORDER — SEMAGLUTIDE (2 MG/DOSE) 8 MG/3ML ~~LOC~~ SOPN: 2.0000 mg | PEN_INJECTOR | SUBCUTANEOUS | 3 refills | Status: DC

## 2023-03-08 MED ORDER — ROSUVASTATIN CALCIUM 10 MG PO TABS: 10.0000 mg | ORAL_TABLET | Freq: Every day | ORAL | 3 refills | Status: DC

## 2023-03-08 MED ORDER — CEPHALEXIN 500 MG PO CAPS: 500.0000 mg | ORAL_CAPSULE | Freq: Three times a day (TID) | ORAL | 0 refills | Status: AC

## 2023-03-08 NOTE — Assessment & Plan Note (Signed)
#   Diabetes- recovered diagnosis from prior recors- brings note from [facility] associated with diet controled diabetes listed - 08/04/2007 S: Medication: has been diet controlled but recently on wegovy for weight loss  CBGs- does not monitor  A/P: Diabetes recovered diagnosis from 2008- had been diet controlled particularly after bariatric surgery. Recently wegovy has been helpful- we will trial ozempic as covered under diabetes at 2 mg vs 2.4 mg.  - update a1c

## 2023-03-08 NOTE — Patient Instructions (Signed)
Covid negative. She will let us know if new or worsening symptoms in regards to the cough/congestion or new fever with concern for aspiration- could treat for aspiration pneumonia in that case  - traveling to [place] in early June and with history of recurrent UTIs requests to have antibiotic on hand- sent in keflex for her  Trial ozempic instead of wegovy - we can print and you can price shop if needed  Please stop by lab before you go If you have mychart- we will send your results within 3 business days of [place] receiving them.  If you do not have mychart- we will call you about results within 5 business days of [place] receiving them.  *please also note that you will see labs on mychart as soon as they post. I will later go in and write notes on them- will say "notes from Dr. Yong Channel"   Recommended follow up: Return for next already scheduled visit or sooner if needed.

## 2023-03-08 NOTE — Progress Notes (Signed)
Phone [phone] In person visit   Subjective:   Alexis Wallace is a 67 y.o. year old very pleasant female patient who presents for/with See problem oriented charting Chief Complaint  Patient presents with   medicaton discussion    Pt would like to discuss Wegovy vs ozempic   Gastroesophageal Reflux    Aspirated the other night and has a cough with phlem from it.    Past Medical History-  Patient Active Problem List   Diagnosis Date Noted   Diabetes mellitus, type II 03/08/2023    Priority: High   Nonobstructive CAD 09/09/2022    Priority: High   Recurrent UTI 04/07/2016    Priority: High   Aortic atherosclerosis 09/09/2022    Priority: Medium    Vitamin D deficiency 08/28/2022    Priority: Medium    Hyperlipidemia 05/20/2017    Priority: Medium    History of adenomatous polyp of colon 10/12/2016    Priority: Medium    Rosacea 08/30/2014    Priority: Medium    Insomnia 08/30/2014    Priority: Medium    Bariatric surgery status 09/01/2010    Priority: Medium    DETRUSOR, 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    Priority: Medium    ANEMIA, B12 DEFICIENCY 08/15/2007    Priority: Medium    Plantar fasciitis, right 12/13/2014    Priority: Low   Solitary pulmonary nodule 12/05/2014    Priority: Low   CAP (community acquired pneumonia) 11/18/2014    Priority: Low   Family history of hypertrophic cardiomyopathy 05/13/2011    Priority: Low   Actinic keratosis 10/22/2010    Priority: Low   ALLERGIC RHINITIS 03/01/2008    Priority: Low   GERD 06/02/2007    Priority: Low   OSTEOARTHRITIS 06/02/2007    Priority: Low   OSTEOPENIA 06/02/2007    Priority: Low   COVID-19 07/05/2020    Medications- reviewed and updated Current Outpatient Medications  Medication Sig Dispense Refill   aspirin EC 81 MG tablet Take 81 mg by mouth daily. Swallow whole.     calcium carbonate 200 MG capsule Take 3,000 mg by mouth daily.     Cholecalciferol 1.25 MG (50000 UT) TABS 50,000 units PO qwk  for 13 weeks. 13 tablet 1   cyanocobalamin (VITAMIN B12) 1000 MCG/ML injection INJECT 1ML (1000MCG TOTAL) INTO THE MUSCLE EVERY 30 DAYS 3 mL 3   estradiol (ESTRACE) 0.1 MG/GM vaginal cream Place 0.5 g vaginally 2 (two) times a week. Place 0.5g nightly for two weeks then twice a week after 30 g 11   estradiol (ESTRACE) 1 MG tablet Through GYN Dr. Runell Gess     fluticasone (FLONASE) 50 MCG/ACT nasal spray USE 2 SPRAYS IN EACH NOSTRIL ONCE DAILY 16 g 1   methenamine (HIPREX) 1 g tablet Take 1 tablet (1 g total) by mouth 2 (two) times daily with a meal. 60 tablet 11   Multiple Vitamin (MULTIVITAMIN) tablet Take 1 tablet by mouth daily.     omeprazole (PRILOSEC) 20 MG capsule TAKE ONE CAPSULE BY MOUTH DAILY 90 capsule 3   progesterone (PROMETRIUM) 200 MG capsule TAKE ONE CAPSULE BY MOUTH DAILY AT BEDTIME.     progesterone (PROMETRIUM) 200 MG capsule Take 1 capsule (200 mg total) by mouth at bedtime. 90 capsule 3   rosuvastatin (CRESTOR) 20 MG tablet Take 1 tablet (20 mg total) by mouth daily. 90 tablet 3   Semaglutide-Weight Management (WEGOVY) 2.4 MG/0.75ML SOAJ Inject 2.4 mg into the skin every 7 (seven) days. 3 mL  11   sulfamethoxazole-trimethoprim (BACTRIM DS) 800-160 MG tablet Take 1 tablet by mouth 2 (two) times daily for 7 days. 14 tablet 0   temazepam (RESTORIL) 15 MG capsule TAKE ONE CAPSULE (15MG  TOTAL) BY MOUTH AT BEDTIME AS NEEDED FOR SLEEP 30 capsule 3   valACYclovir (VALTREX) 1000 MG tablet Take 2 pills twice a day for 1 day at first sign of cold sore 30 tablet 1   No current facility-administered medications for this visit.     Objective:  BP 124/70   Pulse 64   Temp (!) 97.2 F (36.2 C)   Ht 5\' 5"  (1.651 m)   Wt 152 lb 11.2 oz (69.3 kg)   SpO2 95%   BMI 25.41 kg/m  Gen: NAD, resting comfortably- does have deep sounding cough Tympanic membrane normal bilaterally - oropharynx largely normal other than some drainage, turbinates erythematous and swollen CV: RRR no murmurs rubs or  gallops Lungs: CTAB no crackles, wheeze, rhonchi Skin: warm, dry    Assessment and Plan   #Nonobstructive CAD-on CT cardiac scoring 09/03/2022-Coronary calcium score is 269 and this is at percentile 88  #hyperlipidemia #Aortic atherosclerosis S: Medication:Rosuvastatin 20 mg daily - First visit with Dr. Oval Linsey 10/27/2022 - was released - no chest pain or shortness of breath  Lab Results  Component Value Date   CHOL 109 02/25/2023   HDL 66.30 02/25/2023   LDLCALC 34 02/25/2023   LDLDIRECT 84.0 12/10/2015   TRIG 46.0 02/25/2023   CHOLHDL 2 02/25/2023   A/P: nonobstructive CAD asymptomatic - continue current medications except reduce dose of rosuvastatin to 10 mg with LDL under 40 on last check  # Diabetes- recovered diagnosis from prior recors- brings note from [facility] associated with diet controled diabetes listed - 08/04/2007 S: Medication: has been diet controlled but recently on wegovy for weight loss  CBGs- does not monitor  A/P: Diabetes recovered diagnosis from 2008- had been diet controlled particularly after bariatric surgery. Recently wegovy has been helpful- we will trial ozempic as covered under diabetes at 2 mg vs 2.4 mg.  -we are also trying to get prior Dr. Arnoldo Morale records for further confirmation  #Status post-bariatric surgery #overweight S:medication: wegovy 2.4 mg.  Peak weight 213 before medication started-down 70 pounds by September 2023  - reports at end of 2 weeks - trying to stretch wegovy ends up with a lot of mental food noise and quite difficult for her.  A/P: largely maintaining weight on wegovy- see above but trialing switching to ozempic  -Encouraged need for healthy eating, regular exercise, weight loss.   #Concern for UTI #Cough S: Patients symptoms started with urinary frequency about a week ago and some urgency. Went to urgent care after developed right low back pain concerned for UTI. Also had nausea. They were concerned based off UA for UTI  and treated with Bactrim DS. Culture came back negative for UTI. She reports gradual resolution of the urgency and frequency and back pain resolved- does have some lingering malaise/fatigue. Occasional chills- has not had measured fever.   Reports had acid reflux when sleeping on Friday and thinks she may have aspirated. Has started with a cough since that evening. Reports fair amount of phlegm- sometimes green, sometimes clear. Fatigue and chills started around Thursday. No home covid test yet.   ROS- no CURRENT fever, nausea, vomiting, flank pain. No blood in urine.  A/P: UTI was not confirmed and she has stopped antibiotic. Wonder if she has a viral illness that is  causing cough/congestion. Covid negative. She will let us know if new or worsening symptoms in regards to the cough/congestion or new fever with concern for aspiration- could treat for aspiration pneumonia in that case - traveling to [place] in early June and with history of recurrent UTIs requests to have antibiotic on hand- sent in keflex for her. Still on methenamine through Dr. Wannetta Sender   Recommended follow up: Return for next already scheduled visit or sooner if needed. Future Appointments  Date Time Provider [place]  08/27/2023  9:00 AM Marin Olp, MD [facility]  10/22/2023  9:20 AM Jaquita Folds, MD [facility]  11/15/2023 11:30 AM [facility] ANNUAL WELLNESS VISIT 1 [facility]    Lab/Order associations:   ICD-10-CM   1. Type 2 diabetes mellitus without complication, without long-term current use of insulin  E11.9 Hemoglobin A1c    Microalbumin / creatinine urine ratio    2. Coronary artery disease involving native coronary artery of native heart without angina pectoris  I25.10     3. Recurrent UTI  N39.0     4. Hyperlipidemia, unspecified hyperlipidemia type  E78.5       Meds ordered this encounter  Medications   cephALEXin (KEFLEX) 500 MG capsule    Sig: Take 1 capsule (500 mg total) by  mouth 3 (three) times daily for 7 days.    Dispense:  21 capsule    Refill:  0   rosuvastatin (CRESTOR) 10 MG tablet    Sig: Take 1 tablet (10 mg total) by mouth daily.    Dispense:  90 tablet    Refill:  3   Semaglutide, 2 MG/DOSE, 8 MG/3ML SOPN    Sig: Inject 2 mg as directed once a week.    Dispense:  9 mL    Refill:  3    Return precautions advised.  [name], MD

## 2023-03-08 NOTE — Telephone Encounter (Signed)
Rx resent.

## 2023-03-08 NOTE — Telephone Encounter (Signed)
Alexis Wallace with [facility] requests RX that was sent for Ozempic include diagnosis code for Diabetes

## 2023-04-09 DIAGNOSIS — M25562 Pain in left knee: Secondary | ICD-10-CM | POA: Diagnosis not present

## 2023-04-10 ENCOUNTER — Encounter: Payer: Self-pay | Admitting: Family Medicine

## 2023-04-11 ENCOUNTER — Encounter: Payer: Self-pay | Admitting: Family Medicine

## 2023-04-12 ENCOUNTER — Ambulatory Visit: Payer: Medicare PPO | Admitting: Family Medicine

## 2023-04-12 MED ORDER — WEGOVY 2.4 MG/0.75ML ~~LOC~~ SOAJ
2.4000 mg | SUBCUTANEOUS | 3 refills | Status: DC
  Filled 2023-04-12: qty 3, 28d supply, fill #0

## 2023-04-13 ENCOUNTER — Other Ambulatory Visit (HOSPITAL_BASED_OUTPATIENT_CLINIC_OR_DEPARTMENT_OTHER): Payer: Self-pay

## 2023-04-13 ENCOUNTER — Encounter: Payer: Self-pay | Admitting: Family Medicine

## 2023-04-13 NOTE — Telephone Encounter (Signed)
Please see pt msg and response. Thanks

## 2023-05-03 ENCOUNTER — Encounter: Payer: Self-pay | Admitting: Family Medicine

## 2023-05-06 ENCOUNTER — Other Ambulatory Visit (HOSPITAL_BASED_OUTPATIENT_CLINIC_OR_DEPARTMENT_OTHER): Payer: Self-pay

## 2023-05-06 ENCOUNTER — Encounter: Payer: Self-pay | Admitting: Family Medicine

## 2023-05-06 ENCOUNTER — Other Ambulatory Visit: Payer: Self-pay

## 2023-05-06 MED ORDER — WEGOVY 2.4 MG/0.75ML ~~LOC~~ SOAJ
2.4000 mg | SUBCUTANEOUS | 3 refills | Status: DC
  Filled 2023-05-06 – 2023-05-07 (×2): qty 9, 84d supply, fill #0
  Filled 2023-07-29: qty 9, 84d supply, fill #1
  Filled 2023-10-21: qty 9, 84d supply, fill #2
  Filled 2023-12-07: qty 9, 84d supply, fill #3
  Filled 2023-12-09: qty 3, 28d supply, fill #3
  Filled 2023-12-23: qty 9, 84d supply, fill #3

## 2023-05-07 ENCOUNTER — Other Ambulatory Visit (HOSPITAL_BASED_OUTPATIENT_CLINIC_OR_DEPARTMENT_OTHER): Payer: Self-pay

## 2023-05-07 DIAGNOSIS — R0781 Pleurodynia: Secondary | ICD-10-CM | POA: Diagnosis not present

## 2023-05-07 DIAGNOSIS — M25562 Pain in left knee: Secondary | ICD-10-CM | POA: Diagnosis not present

## 2023-06-23 DIAGNOSIS — M25562 Pain in left knee: Secondary | ICD-10-CM | POA: Diagnosis not present

## 2023-07-02 DIAGNOSIS — H2513 Age-related nuclear cataract, bilateral: Secondary | ICD-10-CM | POA: Diagnosis not present

## 2023-07-02 DIAGNOSIS — H5213 Myopia, bilateral: Secondary | ICD-10-CM | POA: Diagnosis not present

## 2023-07-02 DIAGNOSIS — H40003 Preglaucoma, unspecified, bilateral: Secondary | ICD-10-CM | POA: Diagnosis not present

## 2023-07-07 ENCOUNTER — Ambulatory Visit
Admission: EM | Admit: 2023-07-07 | Discharge: 2023-07-07 | Disposition: A | Discharge status: Home / Self Care | Payer: Medicare PPO | Attending: Family Medicine | Admitting: Family Medicine

## 2023-07-07 DIAGNOSIS — N390 Urinary tract infection, site not specified: Secondary | ICD-10-CM | POA: Insufficient documentation

## 2023-07-07 LAB — POCT URINALYSIS DIP (MANUAL ENTRY)
Glucose, UA: NEGATIVE mg/dL
Nitrite, UA: NEGATIVE
Protein Ur, POC: 30 mg/dL — AB
Spec Grav, UA: >=1.03 — AB (ref 1.010–1.025)
Urobilinogen, UA: 0.2 E.U./dL
pH, UA: 5.5 (ref 5.0–8.0)

## 2023-07-07 MED ORDER — CEPHALEXIN 500 MG PO CAPS: 500.0000 mg | ORAL_CAPSULE | Freq: Two times a day (BID) | ORAL | 0 refills | Status: DC

## 2023-07-07 NOTE — ED Triage Notes (Signed)
Pt reports frequent urination, LOW BACK PAIN, constant bladder pressure and headache x 3 days.

## 2023-07-07 NOTE — ED Provider Notes (Signed)
[facility]    CSN: [id number] Arrival date & time: 07/07/23  1149      History   Chief Complaint No chief complaint on file.   HPI Alexis Wallace is a 67 y.o. female.   Patient presenting today with a 3-day history of urinary frequency, low back aching bilaterally, suprapubic pressure, headache.  Denies fever, chills, nausea, vomiting, hematuria, vaginal symptoms.  So far not tried anything over-the-counter for symptoms.  States history of frequent urinary tract infections that always start like this.    Past Medical History:  Diagnosis Date   Allergy    Anemia, iron deficiency    B12 deficiency    CAP (community acquired pneumonia) 11/18/2014   Heartburn    Iron deficiency    Osteoarthritis    Osteopenia    Solitary pulmonary nodule 12/05/2014   Dx 11/19/14  3 mm > f/u 11/27/2015 > resolved, no further studies rec     Tibial plateau fracture    june 2020- murphy/wainer cared for her   Urinary tract infection     Patient Active Problem List   Diagnosis Date Noted   Diabetes mellitus, type II (HCC) 03/08/2023   Aortic atherosclerosis (HCC) 09/09/2022   Nonobstructive CAD 09/09/2022   Vitamin D deficiency 08/28/2022   COVID-19 07/05/2020   Hyperlipidemia 05/20/2017   History of adenomatous polyp of colon 10/12/2016   Recurrent UTI 04/07/2016   Plantar fasciitis, right 12/13/2014   Solitary pulmonary nodule 12/05/2014   CAP (community acquired pneumonia) 11/18/2014   Rosacea 08/30/2014   Insomnia 08/30/2014   Family history of hypertrophic cardiomyopathy 05/13/2011   Actinic keratosis 10/22/2010   Bariatric surgery status 09/01/2010   DETRUSOR, 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   ALLERGIC RHINITIS 03/01/2008   ANEMIA, B12 DEFICIENCY 08/15/2007   GERD 06/02/2007   OSTEOARTHRITIS 06/02/2007   OSTEOPENIA 06/02/2007    Past Surgical History:  Procedure Laterality Date   carpal tunnel right     COLONOSCOPY     COSMETIC SURGERY     tummy tuck    GASTRIC BYPASS  2001   POLYPECTOMY      OB History     Gravida  2   Para  2   Term  2   Preterm      AB      Living  2      SAB      IAB      Ectopic      Multiple      Live Births  2            Home Medications    Prior to Admission medications   Medication Sig Start Date End Date Taking? Authorizing Provider  cephALEXin (KEFLEX) 500 MG capsule Take 1 capsule (500 mg total) by mouth 2 (two) times daily. 07/07/23  Yes Particia Nearing, PA-C  aspirin EC 81 MG tablet Take 81 mg by mouth daily. Swallow whole.    [provider]  calcium carbonate 200 MG capsule Take 3,000 mg by mouth daily.    [provider]  Cholecalciferol 1.25 MG (50000 UT) TABS 50,000 units PO qwk for 13 weeks. 02/12/23   Shelva Majestic, MD  cyanocobalamin (VITAMIN B12) 1000 MCG/ML injection INJECT ( TOTAL) INTO THE MUSCLE EVERY 30 DAYS 02/12/23   Shelva Majestic, MD  estradiol (ESTRACE) 0.1 MG/GM vaginal cream Place 0.5 g vaginally 2 (two) times a week. Place 0.5g nightly for two weeks then twice a week after 10/26/22  Marguerita Beards, MD  estradiol (ESTRACE) 1 MG tablet Through GYN Dr. Adalberto Ill    [provider]  fluticasone [facility]) 50 MCG/ACT nasal spray USE 2 SPRAYS IN [facility] NOSTRIL ONCE DAILY 01/29/20   Shelva Majestic, MD  methenamine (HIPREX) 1 g tablet Take 1 tablet (1 g total) by mouth 2 (two) times daily with a meal. 10/23/22   Marguerita Beards, MD  Multiple Vitamin (MULTIVITAMIN) tablet Take 1 tablet by mouth daily.    [provider]  omeprazole (PRILOSEC) 20 MG capsule TAKE ONE CAPSULE BY MOUTH DAILY 05/08/22   Shelva Majestic, MD  progesterone (PROMETRIUM) 200 MG capsule TAKE ONE CAPSULE BY MOUTH DAILY AT BEDTIME. 12/09/20   [provider]  progesterone (PROMETRIUM) 200 MG capsule Take 1 capsule (200 mg total) by mouth at bedtime. 08/17/22     rosuvastatin (CRESTOR) 10 MG tablet Take 1 tablet (10 mg total) by  mouth daily. 03/08/23   Shelva Majestic, MD  Semaglutide-Weight Management (WEGOVY) 2.4 MG/0.75ML SOAJ Inject 2.4 mg into the skin once a week. 05/06/23   Shelva Majestic, MD  temazepam (RESTORIL) 15 MG capsule TAKE ONE CAPSULE (15MG  TOTAL) BY MOUTH AT BEDTIME AS NEEDED FOR SLEEP 01/11/23   Shelva Majestic, MD  valACYclovir (VALTREX) 1000 MG tablet Take 2 pills twice a day for 1 day at first sign of cold sore 08/29/19   Shelva Majestic, MD    Family History Family History  Problem Relation Age of Onset   Lung cancer Mother        stage IV, smoker   Heart attack Father 58   Heart disease Father        started 62 cad- died at 57   Heart disease Sister        Hypertrophic cardiomyopathy   Uterine cancer Sister    Hypertrophic cardiomyopathy Sister    Colon cancer Neg Hx    Esophageal cancer Neg Hx    Rectal cancer Neg Hx    Stomach cancer Neg Hx    Colon polyps Neg Hx     Social History Social History   Tobacco Use   Smoking status: Former    Current packs/day: 0.00    Average packs/day: 1 pack/day for 5.0 years (5.0 ttl pk-yrs)    Types: Cigarettes    Start date: 02/10/1973    Quit date: 02/10/1978    Years since quitting: 45.4   Smokeless tobacco: Never  Vaping Use   Vaping status: Never Used  Substance Use Topics   Alcohol use: No   Drug use: No     Allergies   Patient has no known allergies.   Review of Systems Review of Systems Per HPI  Physical Exam Triage Vital Signs ED Triage Vitals [07/07/23 1155]  Encounter Vitals Group     BP 139/76     Systolic BP Percentile      Diastolic BP Percentile      Pulse Rate 67     Resp 18     Temp 97.6 F (36.4 C)     Temp Source Oral     SpO2 96 %     Weight      Height      Head Circumference      Peak Flow      Pain Score      Pain Loc      Pain Education      Exclude from Growth Chart    No  data found.  Updated Vital Signs BP 139/76 (BP Location: Right Arm)   Pulse 67   Temp 97.6 F (36.4 C)  (Oral)   Resp 18   SpO2 96%   Visual Acuity Right Eye Distance:   Left Eye Distance:   Bilateral Distance:    Right Eye Near:   Left Eye Near:    Bilateral Near:     Physical Exam Vitals and nursing note reviewed.  Constitutional:      Appearance: Normal appearance. She is not ill-appearing.  HENT:     Head: Atraumatic.  Eyes:     Extraocular Movements: Extraocular movements intact.     Conjunctiva/sclera: Conjunctivae normal.  Cardiovascular:     Rate and Rhythm: Normal rate and regular rhythm.     Heart sounds: Normal heart sounds.  Pulmonary:     Effort: Pulmonary effort is normal.     Breath sounds: Normal breath sounds.  Abdominal:     General: Bowel sounds are normal. There is no distension.     Palpations: Abdomen is soft.     Tenderness: There is no abdominal tenderness. There is no right CVA tenderness, left CVA tenderness or guarding.  Musculoskeletal:        General: Normal range of motion.     Cervical back: Normal range of motion and neck supple.  Skin:    General: Skin is warm and dry.  Neurological:     Mental Status: She is alert and oriented to person, place, and time.  Psychiatric:        Mood and Affect: Mood normal.        Thought Content: Thought content normal.        Judgment: Judgment normal.      UC Treatments / Results  Labs (all labs ordered are listed, but only abnormal results are displayed) Labs Reviewed  POCT URINALYSIS DIP (MANUAL ENTRY) - Abnormal; Notable for the following components:      Result Value   Bilirubin, UA small (*)    Ketones, POC UA trace (5) (*)    Spec Grav, UA >=1.030 (*)    Blood, UA trace-intact (*)    Protein Ur, POC =30 (*)    Leukocytes, UA Trace (*)    All other components within normal limits  URINE CULTURE    EKG   Radiology No results found.  Procedures Procedures (including critical care time)  Medications Ordered in UC Medications - No data to display  Initial Impression /  Assessment and Plan / UC Course  I have reviewed the triage vital signs and the nursing notes.  Pertinent labs & imaging results that were available during my care of the patient were reviewed by me and considered in my medical decision making (see chart for details).     Vitals and exam reassuring today, urinalysis with evidence of a possible urinary tract infection.  Urine culture pending, start Keflex while waiting and adjust if needed.  Return for worsening symptoms.  Final Clinical Impressions(s) / UC Diagnoses   Final diagnoses:  Acute lower UTI   Discharge Instructions   None    ED Prescriptions     Medication Sig Dispense Auth. Provider   cephALEXin (KEFLEX) 500 MG capsule Take 1 capsule (500 mg total) by mouth 2 (two) times daily. 10 capsule Particia Nearing, [place]      PDMP not reviewed this encounter.   Particia Nearing, [place] 07/07/23 1249

## 2023-07-16 ENCOUNTER — Encounter: Payer: Self-pay | Admitting: Family Medicine

## 2023-07-16 DIAGNOSIS — H04123 Dry eye syndrome of bilateral lacrimal glands: Secondary | ICD-10-CM | POA: Diagnosis not present

## 2023-07-16 MED ORDER — CEPHALEXIN 500 MG PO CAPS: 500.0000 mg | ORAL_CAPSULE | Freq: Three times a day (TID) | ORAL | 0 refills | Status: DC

## 2023-07-22 ENCOUNTER — Encounter (INDEPENDENT_AMBULATORY_CARE_PROVIDER_SITE_OTHER): Payer: Self-pay

## 2023-07-23 ENCOUNTER — Other Ambulatory Visit: Payer: Self-pay

## 2023-07-23 ENCOUNTER — Ambulatory Visit (HOSPITAL_COMMUNITY): Payer: Medicare PPO | Attending: Orthopedic Surgery | Admitting: Physical Therapy

## 2023-07-23 ENCOUNTER — Encounter (HOSPITAL_COMMUNITY): Payer: Self-pay | Admitting: Physical Therapy

## 2023-07-23 DIAGNOSIS — R2689 Other abnormalities of gait and mobility: Secondary | ICD-10-CM | POA: Diagnosis not present

## 2023-07-23 DIAGNOSIS — M6281 Muscle weakness (generalized): Secondary | ICD-10-CM | POA: Diagnosis not present

## 2023-07-23 DIAGNOSIS — M25562 Pain in left knee: Secondary | ICD-10-CM | POA: Diagnosis not present

## 2023-07-23 DIAGNOSIS — R29898 Other symptoms and signs involving the musculoskeletal system: Secondary | ICD-10-CM | POA: Diagnosis not present

## 2023-07-23 NOTE — Therapy (Signed)
OUTPATIENT PHYSICAL THERAPY LOWER EXTREMITY EVALUATION   Patient Name: Alexis Wallace MRN: [id number] DOB:09/10/1956, 67 y.o., female Today's Date: 07/23/2023  END OF SESSION:  PT End of Session - 07/23/23 1029     Visit Number 1    Number of Visits 1    Date for PT Re-Evaluation 07/23/23    Authorization Type [employer]    PT Start Time 1030    PT Stop Time 1115    PT Time Calculation (min) 45 min    Activity Tolerance Patient tolerated treatment well    Behavior During Therapy WFL for tasks assessed/performed             Past Medical History:  Diagnosis Date   Allergy    Anemia, iron deficiency    B12 deficiency    CAP (community acquired pneumonia) 11/18/2014   Heartburn    Iron deficiency    Osteoarthritis    Osteopenia    Solitary pulmonary nodule 12/05/2014   Dx 11/19/14  3 mm > f/u 11/27/2015 > resolved, no further studies rec     Tibial plateau fracture    june 2020- murphy/wainer cared for her   Urinary tract infection    Past Surgical History:  Procedure Laterality Date   carpal tunnel right     COLONOSCOPY     COSMETIC SURGERY     tummy tuck   GASTRIC BYPASS  2001   POLYPECTOMY     Patient Active Problem List   Diagnosis Date Noted   Diabetes mellitus, type II (HCC) 03/08/2023   Aortic atherosclerosis (HCC) 09/09/2022   Nonobstructive CAD 09/09/2022   Vitamin D deficiency 08/28/2022   COVID-19 07/05/2020   Hyperlipidemia 05/20/2017   History of adenomatous polyp of colon 10/12/2016   Recurrent UTI 04/07/2016   Plantar fasciitis, right 12/13/2014   Solitary pulmonary nodule 12/05/2014   CAP (community acquired pneumonia) 11/18/2014   Rosacea 08/30/2014   Insomnia 08/30/2014   Family history of hypertrophic cardiomyopathy 05/13/2011   Actinic keratosis 10/22/2010   Bariatric surgery status 09/01/2010   DETRUSOR, 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   ALLERGIC RHINITIS 03/01/2008   ANEMIA, B12 DEFICIENCY 08/15/2007   GERD 06/02/2007    OSTEOARTHRITIS 06/02/2007   OSTEOPENIA 06/02/2007    PCP: Tana Conch MD  REFERRING PROVIDER: Sheral Apley, MD  REFERRING DIAG: [phone] (ICD-10-CM) - Left knee pain  THERAPY DIAG:  Left knee pain, unspecified chronicity  Muscle weakness (generalized)  Other abnormalities of gait and mobility  Other symptoms and signs involving the musculoskeletal system  Rationale for Evaluation and Treatment: Rehabilitation  ONSET DATE: 5 years but more over last 3 years  SUBJECTIVE:   SUBJECTIVE STATEMENT: Patient states knee hurts all the time. Was told she doesn't have any cartilage in her knee. Had 2 cortisone shots, first helped second didn't. Symptoms increase with stairs, transfers. Goes to gym 3 days per week. Difficulty with squatting to floor to get into cabinets. Is looking for HEP to work on at home/gym.  PERTINENT HISTORY: Osteopenia, hx L tibial plateau fx June 2020, DM HLD  PAIN:  Are you having pain? No Worst: 5-6/10   PRECAUTIONS: None  WEIGHT BEARING RESTRICTIONS: No  FALLS:  Has patient fallen in last 6 months? No  OCCUPATION: [employer]   PLOF: Independent  PATIENT GOALS: strengthening, knee to feel better   OBJECTIVE: (objective measures from initial evaluation unless otherwise dated)  PATIENT SURVEYS: FOTO not completed - one time visit  COGNITION: Overall cognitive status: Within functional  limits for tasks assessed     SENSATION: WFL  PALPATION: TTP lateral superior patella, lateral joint line  LOWER EXTREMITY ROM:WFL for tasks assessed  Active ROM Right eval Left eval  Hip flexion    Hip extension    Hip abduction    Hip adduction    Hip internal rotation    Hip external rotation    Knee flexion    Knee extension    Ankle dorsiflexion    Ankle plantarflexion    Ankle inversion    Ankle eversion     (Blank rows = not tested) *= pain/symptoms  LOWER EXTREMITY MMT:  MMT Right eval Left eval  Hip flexion 4  4  Hip extension 4- 4-  Hip abduction 4- 4-  Hip adduction    Hip internal rotation    Hip external rotation    Knee flexion 5 5  Knee extension 5 4+  Ankle dorsiflexion 5 5  Ankle plantarflexion    Ankle inversion    Ankle eversion     (Blank rows = not tested) *= pain/symptoms    FUNCTIONAL TESTS:  Stairs: 7 inch,  alternating pattern, able to complete without UE support, decreased eccentric strength and motor control  Forward step down test: able to complete on R with slight dynamic knee valgus, L decreased strength and motor control with L knee pain  GAIT: Distance walked: 50 feet Assistive device utilized: None Level of assistance: Complete Independence Comments:  slight L knee valgus position >R   TODAY'S TREATMENT:                                                                                                                              DATE:  07/23/23 EVAL and HEP, exercise mechanics, education Step exercises    PATIENT EDUCATION:  Education details: Patient educated on exam findings, POC, scope of PT, HEP, and exercise mechanics, progressions/regressions. Person educated: Patient Education method: Explanation, Demonstration, and Handouts Education comprehension: verbalized understanding, returned demonstration, verbal cues required, and tactile cues required  HOME EXERCISE PROGRAM: Access Code: [email or web address] URL: [email or web address]  Date: 07/23/2023 - Sidelying Hip Abduction  - 1 x daily - 7 x weekly - 2 sets - 10 reps - Active Straight Leg Raise with Quad Set  - 1 x daily - 7 x weekly - 2 sets - 10 reps - Prone Hip Extension  - 1 x daily - 7 x weekly - 2 sets - 10 reps - Squat  - 1 x daily - 7 x weekly - 2 sets - 10 reps - Sit to Stand with Arms Crossed  - 1 x daily - 7 x weekly - 2 sets - 10 reps - Step Up  - 1 x daily - 7 x weekly - 2 sets - 10 reps - Lateral Step Down  - 1 x daily - 7 x weekly - 2 sets - 10 reps - Forward Step Down  - 1 x  daily - 7 x weekly - 2 sets - 10 reps - Seated Long Arc Quad (Mirrored)  - 1 x daily - 7 x weekly - 2 sets - 10 reps - 5-10 second hold  ASSESSMENT:  CLINICAL IMPRESSION: Patient a 67 y.o. y.o. female who was seen today for physical therapy evaluation and treatment for L knee pain. Patient presents with pain limited deficits in L knee strength, activity tolerance, gait, balance, and functional mobility with ADL. Patient is having to modify and restrict ADL as indicated by outcome measure score as well as subjective information and objective measures which is affecting overall participation. Patient given HEP and educated on exercise mechanics, progressions/regressions and returning to PT if needed. Patient performs step exercises with cueing for mechanics. Patient will begin HEP and return to PT if need.   OBJECTIVE IMPAIRMENTS: Abnormal gait, decreased activity tolerance, decreased balance, decreased endurance, decreased mobility, difficulty walking, decreased ROM, decreased strength, increased muscle spasms, impaired flexibility, improper body mechanics, and pain.   ACTIVITY LIMITATIONS: carrying, lifting, bending, standing, squatting, stairs, transfers, locomotion level, and caring for others  PARTICIPATION LIMITATIONS: meal prep, cleaning, laundry, shopping, community activity, and yard work  PERSONAL FACTORS: Time since onset of injury/illness/exacerbation and 3+ comorbidities: Osteopenia, hx tibial plateau fx June 2020, DM HLD  are also affecting patient's functional outcome.   REHAB POTENTIAL: Good  CLINICAL DECISION MAKING: Stable/uncomplicated  EVALUATION COMPLEXITY: Low   GOALS: Goals reviewed with patient? Yes  SHORT TERM GOALS: Target date: 07/23/23  Patient will be issued HEP in order to improve symptoms functional outcomes. Baseline: Goal status: MET   PLAN:  PT FREQUENCY: one time visit  PT DURATION:  1 sessions  PLANNED INTERVENTIONS: Therapeutic exercises,  Therapeutic activity, Neuromuscular re-education, Balance training, Gait training, Patient/Family education, Joint manipulation, Joint mobilization, Stair training, Orthotic/Fit training, DME instructions, Aquatic Therapy, Dry Needling, Electrical stimulation, Spinal manipulation, Spinal mobilization, Cryotherapy, Moist heat, Compression bandaging, scar mobilization, Splintting, Taping, Traction, Ultrasound, Ionotophoresis 4mg /ml Dexamethasone, and Manual therapy  PLAN FOR NEXT SESSION: n/a   Reola Mosher Addelynn Batte, PT 07/23/2023, 11:16 AM

## 2023-07-29 ENCOUNTER — Other Ambulatory Visit: Payer: Self-pay | Admitting: Family Medicine

## 2023-07-29 ENCOUNTER — Other Ambulatory Visit (HOSPITAL_BASED_OUTPATIENT_CLINIC_OR_DEPARTMENT_OTHER): Payer: Self-pay

## 2023-07-29 ENCOUNTER — Other Ambulatory Visit: Payer: Self-pay

## 2023-08-01 ENCOUNTER — Other Ambulatory Visit (HOSPITAL_BASED_OUTPATIENT_CLINIC_OR_DEPARTMENT_OTHER): Payer: Self-pay

## 2023-08-06 DIAGNOSIS — M25562 Pain in left knee: Secondary | ICD-10-CM | POA: Diagnosis not present

## 2023-08-13 DIAGNOSIS — M25562 Pain in left knee: Secondary | ICD-10-CM | POA: Diagnosis not present

## 2023-08-20 DIAGNOSIS — H2513 Age-related nuclear cataract, bilateral: Secondary | ICD-10-CM | POA: Diagnosis not present

## 2023-08-20 LAB — HM DIABETES EYE EXAM

## 2023-08-23 DIAGNOSIS — M1712 Unilateral primary osteoarthritis, left knee: Secondary | ICD-10-CM | POA: Diagnosis not present

## 2023-08-27 ENCOUNTER — Ambulatory Visit (INDEPENDENT_AMBULATORY_CARE_PROVIDER_SITE_OTHER): Payer: Medicare PPO | Admitting: Family Medicine

## 2023-08-27 ENCOUNTER — Encounter: Payer: Self-pay | Admitting: Family Medicine

## 2023-08-27 VITALS — BP 114/62 | HR 67 | Temp 98.7°F | Ht 65.0 in | Wt 148.4 lb

## 2023-08-27 DIAGNOSIS — E119 Type 2 diabetes mellitus without complications: Secondary | ICD-10-CM | POA: Diagnosis not present

## 2023-08-27 DIAGNOSIS — D518 Other vitamin B12 deficiency anemias: Secondary | ICD-10-CM | POA: Diagnosis not present

## 2023-08-27 DIAGNOSIS — E559 Vitamin D deficiency, unspecified: Secondary | ICD-10-CM

## 2023-08-27 DIAGNOSIS — R350 Frequency of micturition: Secondary | ICD-10-CM

## 2023-08-27 DIAGNOSIS — E785 Hyperlipidemia, unspecified: Secondary | ICD-10-CM | POA: Diagnosis not present

## 2023-08-27 DIAGNOSIS — Z7985 Long-term (current) use of injectable non-insulin antidiabetic drugs: Secondary | ICD-10-CM

## 2023-08-27 DIAGNOSIS — Z0000 Encounter for general adult medical examination without abnormal findings: Secondary | ICD-10-CM

## 2023-08-27 DIAGNOSIS — I2510 Atherosclerotic heart disease of native coronary artery without angina pectoris: Secondary | ICD-10-CM | POA: Diagnosis not present

## 2023-08-27 LAB — HEMOGLOBIN A1C: Hgb A1c MFr Bld: 5.3 % (ref 4.6–6.5)

## 2023-08-27 LAB — COMPREHENSIVE METABOLIC PANEL
ALT: 26 U/L (ref 0–35)
AST: 26 U/L (ref 0–37)
Albumin: 3.9 g/dL (ref 3.5–5.2)
Alkaline Phosphatase: 64 U/L (ref 39–117)
BUN: 12 mg/dL (ref 6–23)
CO2: 29 mEq/L (ref 19–32)
Calcium: 9.2 mg/dL (ref 8.4–10.5)
Chloride: 104 mEq/L (ref 96–112)
Creatinine, Ser: 0.71 mg/dL (ref 0.40–1.20)
GFR: 86.61 mL/min (ref >60.00)
Glucose, Bld: 78 mg/dL (ref 70–99)
Potassium: 4 mEq/L (ref 3.5–5.1)
Sodium: 139 mEq/L (ref 135–145)
Total Bilirubin: 0.6 mg/dL (ref 0.2–1.2)
Total Protein: 6.5 g/dL (ref 6.0–8.3)

## 2023-08-27 LAB — URINALYSIS, ROUTINE W REFLEX MICROSCOPIC
Bilirubin Urine: NEGATIVE
Hgb urine dipstick: NEGATIVE
Ketones, ur: NEGATIVE
Leukocytes,Ua: NEGATIVE
Nitrite: NEGATIVE
RBC / HPF: NONE SEEN (ref 0–?)
Specific Gravity, Urine: <=1.005 — AB (ref 1.000–1.030)
Total Protein, Urine: NEGATIVE
Urine Glucose: NEGATIVE
Urobilinogen, UA: 0.2 (ref 0.0–1.0)
pH: 6 (ref 5.0–8.0)

## 2023-08-27 LAB — LIPID PANEL
Cholesterol: 129 mg/dL (ref 0–200)
HDL: 82.5 mg/dL (ref >39.00)
LDL Cholesterol: 35 mg/dL (ref 0–99)
NonHDL: 46.59
Total CHOL/HDL Ratio: 2
Triglycerides: 58 mg/dL (ref 0.0–149.0)
VLDL: 11.6 mg/dL (ref 0.0–40.0)

## 2023-08-27 LAB — CBC WITH DIFFERENTIAL/PLATELET
Basophils Absolute: 0.1 10*3/uL (ref 0.0–0.1)
Basophils Relative: 0.9 % (ref 0.0–3.0)
Eosinophils Absolute: 0.2 10*3/uL (ref 0.0–0.7)
Eosinophils Relative: 2.6 % (ref 0.0–5.0)
HCT: 37.8 % (ref 36.0–46.0)
Hemoglobin: 12.3 g/dL (ref 12.0–15.0)
Lymphocytes Relative: 25 % (ref 12.0–46.0)
Lymphs Abs: 1.5 10*3/uL (ref 0.7–4.0)
MCHC: 32.5 g/dL (ref 30.0–36.0)
MCV: 94.2 fl (ref 78.0–100.0)
Monocytes Absolute: 0.3 10*3/uL (ref 0.1–1.0)
Monocytes Relative: 5.6 % (ref 3.0–12.0)
Neutro Abs: 3.9 10*3/uL (ref 1.4–7.7)
Neutrophils Relative %: 65.9 % (ref 43.0–77.0)
Platelets: 171 10*3/uL (ref 150.0–400.0)
RBC: 4.01 Mil/uL (ref 3.87–5.11)
RDW: 13.4 % (ref 11.5–15.5)
WBC: 5.8 10*3/uL (ref 4.0–10.5)

## 2023-08-27 LAB — VITAMIN B12: Vitamin B-12: 418 pg/mL (ref 211–911)

## 2023-08-27 LAB — VITAMIN D 25 HYDROXY (VIT D DEFICIENCY, FRACTURES): VITD: 28.87 ng/mL — ABNORMAL LOW (ref 30.00–100.00)

## 2023-08-27 NOTE — Patient Instructions (Addendum)
Let us know when you get flu and COVID shot  Need diabetes eye exam with next eye exam- please inform them  Please stop by lab before you go If you have mychart- we will send your results within 3 business days of [place] receiving them.  If you do not have mychart- we will call you about results within 5 business days of [place] receiving them.  *please also note that you will see labs on mychart as soon as they post. I will later go in and write notes on them- will say "notes from Dr. Durene Cal"   Recommended follow up: Return in about 6 months (around 02/24/2024) for followup or sooner if needed.Schedule b4 you leave.

## 2023-08-27 NOTE — Progress Notes (Signed)
Phone [phone]   Subjective:  Patient presents today for their annual physical. Chief complaint-noted.   See problem oriented charting- ROS- full  review of systems was completed and negative except for: headache(s), frequent urination, abdominal pain  The following were reviewed and entered/updated in epic: Past Medical History:  Diagnosis Date   Allergy    Anemia, iron deficiency    B12 deficiency    CAP (community acquired pneumonia) 11/18/2014   Heartburn    Iron deficiency    Osteoarthritis    Osteopenia    Solitary pulmonary nodule 12/05/2014   Dx 11/19/14  3 mm > f/u 11/27/2015 > resolved, no further studies rec     Tibial plateau fracture    june 2020- murphy/wainer cared for her   Urinary tract infection    Patient Active Problem List   Diagnosis Date Noted   Diabetes mellitus, type II (HCC) 03/08/2023    Priority: High   Nonobstructive CAD 09/09/2022    Priority: High   Recurrent UTI 04/07/2016    Priority: High   Aortic atherosclerosis (HCC) 09/09/2022    Priority: Medium    Vitamin D deficiency 08/28/2022    Priority: Medium    Hyperlipidemia 05/20/2017    Priority: Medium    History of adenomatous polyp of colon 10/12/2016    Priority: Medium    Rosacea 08/30/2014    Priority: Medium    Insomnia 08/30/2014    Priority: Medium    Bariatric surgery status 09/01/2010    Priority: Medium    DETRUSOR, 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    Priority: Medium    ANEMIA, B12 DEFICIENCY 08/15/2007    Priority: Medium    Plantar fasciitis, right 12/13/2014    Priority: Low   Solitary pulmonary nodule 12/05/2014    Priority: Low   CAP (community acquired pneumonia) 11/18/2014    Priority: Low   Family history of hypertrophic cardiomyopathy 05/13/2011    Priority: Low   Actinic keratosis 10/22/2010    Priority: Low   ALLERGIC RHINITIS 03/01/2008    Priority: Low   GERD 06/02/2007    Priority: Low   OSTEOARTHRITIS 06/02/2007    Priority: Low   OSTEOPENIA  06/02/2007    Priority: Low   Past Surgical History:  Procedure Laterality Date   carpal tunnel right     COLONOSCOPY     COSMETIC SURGERY     tummy tuck   GASTRIC BYPASS  2001   POLYPECTOMY      Family History  Problem Relation Age of Onset   Lung cancer Mother        stage IV, smoker   COPD Mother    Heart attack Father 73   Heart disease Father        started 44 cad- died at 80   Heart disease Sister        Hypertrophic cardiomyopathy   Cancer Sister    Hypertension Sister    Uterine cancer Sister    Hypertrophic cardiomyopathy Sister    Asthma Sister    Heart disease Sister    Colon cancer Neg Hx    Esophageal cancer Neg Hx    Rectal cancer Neg Hx    Stomach cancer Neg Hx    Colon polyps Neg Hx     Medications- reviewed and updated Current Outpatient Medications  Medication Sig Dispense Refill   aspirin EC 81 MG tablet Take 81 mg by mouth daily. Swallow whole.     calcium carbonate 200 MG capsule Take 3,000 mg  by mouth daily.     Cholecalciferol 1.25 MG (50000 UT) TABS 50,000 units PO qwk for 13 weeks. 13 tablet 1   cyanocobalamin (VITAMIN B12) 1000 MCG/ML injection INJECT ( TOTAL) INTO THE MUSCLE EVERY 30 DAYS 3 mL 3   estradiol (ESTRACE) 0.1 MG/GM vaginal cream Place 0.5 g vaginally 2 (two) times a week. Place 0.5g nightly for two weeks then twice a week after 30 g 11   estradiol (ESTRACE) 1 MG tablet Through GYN Dr. Adalberto Ill     fluticasone (FLONASE) 50 MCG/ACT nasal spray USE 2 SPRAYS IN EACH NOSTRIL ONCE DAILY 16 g 1   Lifitegrast (XIIDRA) 5 % SOLN Apply 5 % to eye 2 (two) times daily.     methenamine (HIPREX) 1 g tablet Take 1 tablet (1 g total) by mouth 2 (two) times daily with a meal. 60 tablet 11   Multiple Vitamin (MULTIVITAMIN) tablet Take 1 tablet by mouth daily.     omeprazole (PRILOSEC) 20 MG capsule TAKE ONE CAPSULE BY MOUTH DAILY 90 capsule 3   progesterone (PROMETRIUM) 200 MG capsule TAKE ONE CAPSULE BY MOUTH DAILY AT BEDTIME.      progesterone (PROMETRIUM) 200 MG capsule Take 1 capsule (200 mg total) by mouth at bedtime. 90 capsule 3   rosuvastatin (CRESTOR) 10 MG tablet Take 1 tablet (10 mg total) by mouth daily. 90 tablet 3   Semaglutide-Weight Management (WEGOVY) 2.4 MG/0.75ML SOAJ Inject 2.4 mg into the skin once a week. 9 mL 3   temazepam (RESTORIL) 15 MG capsule TAKE ONE CAPSULE (15MG  TOTAL) BY MOUTH AT BEDTIME AS NEEDED FOR SLEEP. Do not drive for at least 8 hours after taking 30 capsule 5   valACYclovir (VALTREX) 1000 MG tablet Take 2 pills twice a day for 1 day at first sign of cold sore 30 tablet 1   Varenicline Tartrate (TYRVAYA) 0.03 MG/ACT SOLN Place 0.3 % into the nose 2 (two) times daily.     No current facility-administered medications for this visit.    Allergies-reviewed and updated No Known Allergies  Social History   Social History Narrative   Married to [employer]. 2 kids. Jonny Ruiz (his wife had TBI being struck by car) with daughter Julious Oka. Patient takes care of Julious Oka and son lives with her and husband) and Judeth Cornfield lives in [place] with no kids.       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  Raising granddaughter after daughter has TBI.       Hobbies: spending time with friends and family          Objective  Objective:  BP 114/62   Pulse 67   Temp 98.7 F (37.1 C)   Ht 5\' 5"  (1.651 m)   Wt 148 lb 6.4 oz (67.3 kg)   SpO2 98%   BMI 24.70 kg/m  Gen: NAD, resting comfortably HEENT: Mucous membranes are moist. Oropharynx normal Neck: no thyromegaly CV: RRR no murmurs rubs or gallops Lungs: CTAB no crackles, wheeze, rhonchi Abdomen: soft/nontender/nondistended/normal bowel sounds. No rebound or guarding.  Ext: trace edema Skin: warm, dry Neuro: grossly normal, moves all extremities, PERRLA  Diabetic Foot Exam - Simple   Simple Foot Form Diabetic Foot exam was performed with the  following findings: Yes 08/27/2023  9:29 AM  Visual Inspection No deformities, no ulcerations, no other skin breakdown bilaterally: Yes Sensation Testing Intact to touch and monofilament testing bilaterally: Yes  Pulse Check Posterior Tibialis and Dorsalis pulse intact bilaterally: Yes Comments      Assessment and Plan   67 y.o. female presenting for annual physical.  Health Maintenance counseling: 1. Anticipatory guidance: Patient counseled regarding regular dental exams -q6 months, eye exams - yearly,  avoiding smoking and second hand smoke , limiting alcohol to 1 beverage per day- doesn't drink , no illicit drugs .   2. Risk factor reduction:  Advised patient of need for regular exercise and diet rich and fruits and vegetables to reduce risk of heart attack and stroke.  Exercise- last year gym 4 days a week with trainer 2 days- has maintained that!   Diet/weight management-down 5 lbs from last physical- weight maintain phase now- sometimes goes to 10 days on prescription for wegovy Wt Readings from Last 3 Encounters:  08/27/23 148 lb 6.4 oz (67.3 kg)  03/08/23 152 lb 11.2 oz (69.3 kg)  02/12/23 153 lb (69.4 kg)  3. Immunizations/screenings/ancillary studies- flu and COVID at later date Immunization History  Administered Date(s) Administered   Fluad Quad(high Dose 65+) 08/04/2019, 09/09/2020, 08/28/2022   Influenza Split 10/03/2012   Influenza Whole 09/06/2001, 10/12/2007, 09/25/2008, 09/01/2010   Influenza,inj,Quad PF,6+ Mos 08/28/2013, 08/30/2014   Influenza-Unspecified 09/21/2015, 08/07/2016, 09/24/2021   Moderna Covid-19 Vaccine Bivalent Booster 63yrs & up 10/04/2021   Moderna SARS-COV2 Booster Vaccination 09/16/2022   PFIZER Comirnaty(Gray Top)Covid-19 Tri-Sucrose Vaccine 04/14/2021   PFIZER(Purple Top)SARS-COV-2 Vaccination 12/26/2019, 01/16/2020, 07/31/2020   PNEUMOCOCCAL CONJUGATE-20 02/04/2022   Pneumococcal Polysaccharide-23 08/04/2019   Respiratory Syncytial Virus  Vaccine,Recomb Aduvanted(Arexvy) 09/16/2022   Td 06/07/2003   Tdap 12/10/2015   Zoster Recombinant(Shingrix) 09/25/2019, 08/01/2021  4. Cervical cancer screening- She still sees GYN now with Dr. Adalberto Ill (with Dr. Jennette Kettle retired) prior D+C feb 2023 due to bleeding issues/spotting in the past-  and still gets PAPs per GYN 5. Breast cancer screening-  breast exam with GYN mammogram -02/18/23 with 1 year repeat 6. Colon cancer screening - last done 08/22/2020 with a 7-year repeat planned or never 7. Skin cancer screening-[place] [facility] Dr. Roderic Scarce.  advised regular sunscreen use. Denies worrisome, changing, or new skin lesions. Ear - small papule that looks irriated- could be seborrheic keratosis- she is going to ask dermatology- we can refer to ENT if needed  8. Birth control/STD check- postmenopausal and monogamous 9. Osteoporosis screening at 50- osteoporosis- DEXA  with Dr. Adalberto Ill- physician has agreed to continue hormone replacement -Former smoker-quit in 1970s.  No regular screenings  Status of chronic or acute concerns   # headache(s)  S:several weeks ago had 2 days of pixilated in vision field- happened similarly when going through menopause. She wondered if could be dry eye related. Also had headache(s)  A/P: with this going away- hold off on workup but if recurs want her of the see [place] and eye doctor.    #reports upcoming cataract surgery in October- just one eye  #Nonobstructive CAD-on CT cardiac scoring 09/03/2022-Coronary calcium score is 269 and this is at percentile 88  #hyperlipidemia S: Medication:Rosuvastatin 20 mg daily--> 10 mg after weight loss - First visit with Dr. Duke Salvia 10/27/2022 - was released - no chest pain or shortness of breath  Lab Results  Component Value Date   CHOL 109 02/25/2023   HDL 66.30 02/25/2023   LDLCALC 34 02/25/2023   LDLDIRECT 84.0 12/10/2015   TRIG 46.0 02/25/2023   CHOLHDL 2 02/25/2023   A/P: coronary artery disease asymptomatic continue  current medications Lipids at goallast visit but reduced dose  so recheck lipids  # Diabetes- recovered diagnosis from prior records- brings note from [facility] associated with diet controled diabetes listed - 08/04/2007 S: Medication: Wegovy 2.4 has been covered CBGs- does not monitor Lab Results  Component Value Date   HGBA1C 5.1 03/08/2023  A/P: well controlled - continue current medications   #Status post-bariatric surgery noted- but still tolerates wegovy  #Insomnia S: Medication: Temazepam 15 mg-knows not to drive at least 8 hours after use - rarely needing- mainly travel or twice a month at home A/P: doing well- continue current medications     # B12 deficiency S: Current treatment/medication (oral vs. IM): Home injections monthly Lab Results  Component Value Date   VITAMINB12 436 02/25/2023  A/P: hopefully stable- update b12 today. Continue current meds for now    #Vitamin D deficiency S: Medication: Has required high-dose vitamin D in the past- and still on weekly Last vitamin D Lab Results  Component Value Date   VD25OH 26.23 (L) 02/25/2023  A/P: slightly low last visit- update today- wonder if may do better on daily higher dose if not improving   #UTI prevention-on Estrace cream and estradiol tablets through urogynecology as well as chronic nitrofurantoin for UTI prophylaxis in the past- now on methanamine through urogyn.  Also takes progesterone through GYN mainly for osteoporosis-see below -still has urinary frequency and incontinence and mild abdominal pain at times- wants to make sure not active urinary tract infection- urinalysis and culture  Recommended follow up: Return in about 6 months (around 02/24/2024) for followup or sooner if needed.Schedule b4 you leave. Future Appointments  Date Time Provider Department Center  10/22/2023  9:20 AM Marguerita Beards, MD [facility]  11/15/2023 11:30 AM [facility] ANNUAL WELLNESS VISIT 1 [facility]   Lab/Order  associations:coffee with creamer 5 am   ICD-10-CM   1. Preventative health care  Z00.00     2. Type 2 diabetes mellitus without complication, without long-term current use of insulin (HCC)  E11.9 Comprehensive metabolic panel    CBC with Differential/Platelet    Lipid panel    Hemoglobin A1c    3. Coronary artery disease involving native coronary artery of native heart without angina pectoris  I25.10     4. Vitamin D deficiency  E55.9 VITAMIN D 25 Hydroxy (Vit-D Deficiency, Fractures)    5. Other vitamin B12 deficiency anemia  D51.8 Vitamin B12    6. Mild hyperlipidemia  E78.5 Comprehensive metabolic panel    CBC with Differential/Platelet    Lipid panel    7. Urinary frequency  R35.0 Urine Culture    Urinalysis, Routine w reflex microscopic    8. Long-term current use of injectable noninsulin antidiabetic medication  Z79.85       No orders of the defined types were placed in this encounter.   Return precautions advised.  Tana Conch, MD

## 2023-08-29 LAB — URINE CULTURE
MICRO NUMBER:: 15495422
SPECIMEN QUALITY:: ADEQUATE

## 2023-08-30 ENCOUNTER — Encounter: Payer: Self-pay | Admitting: Family Medicine

## 2023-08-30 ENCOUNTER — Other Ambulatory Visit: Payer: Self-pay

## 2023-08-30 MED ORDER — CEPHALEXIN 500 MG PO CAPS: 500.0000 mg | ORAL_CAPSULE | Freq: Three times a day (TID) | ORAL | 0 refills | Status: DC

## 2023-08-30 MED ORDER — ROSUVASTATIN CALCIUM 5 MG PO TABS: 10.0000 mg | ORAL_TABLET | Freq: Every day | ORAL | 3 refills | Status: DC

## 2023-08-30 MED ORDER — CHOLECALCIFEROL 1.25 MG (50000 UT) PO TABS: ORAL_TABLET | ORAL | 1 refills | Status: AC

## 2023-09-08 ENCOUNTER — Encounter: Payer: Self-pay | Admitting: Family Medicine

## 2023-09-10 ENCOUNTER — Other Ambulatory Visit: Payer: Self-pay

## 2023-09-10 ENCOUNTER — Other Ambulatory Visit (HOSPITAL_BASED_OUTPATIENT_CLINIC_OR_DEPARTMENT_OTHER): Payer: Self-pay

## 2023-09-10 MED ORDER — PROGESTERONE 200 MG PO CAPS
200.0000 mg | ORAL_CAPSULE | Freq: Every day | ORAL | 1 refills | Status: DC
  Filled 2023-09-10: qty 90, 90d supply, fill #0
  Filled 2023-12-07: qty 90, 90d supply, fill #1

## 2023-09-11 ENCOUNTER — Other Ambulatory Visit (HOSPITAL_BASED_OUTPATIENT_CLINIC_OR_DEPARTMENT_OTHER): Payer: Self-pay

## 2023-09-20 ENCOUNTER — Encounter: Payer: Self-pay | Admitting: Obstetrics and Gynecology

## 2023-09-20 ENCOUNTER — Other Ambulatory Visit (HOSPITAL_COMMUNITY)
Admission: RE | Admit: 2023-09-20 | Discharge: 2023-09-20 | Disposition: A | Discharge status: Home / Self Care | Payer: Medicare PPO | Source: Other Acute Inpatient Hospital | Attending: Obstetrics and Gynecology | Admitting: Obstetrics and Gynecology

## 2023-09-20 ENCOUNTER — Ambulatory Visit: Payer: Medicare PPO | Admitting: Obstetrics and Gynecology

## 2023-09-20 VITALS — BP 117/69 | HR 75

## 2023-09-20 DIAGNOSIS — N390 Urinary tract infection, site not specified: Secondary | ICD-10-CM | POA: Diagnosis not present

## 2023-09-20 DIAGNOSIS — R82998 Other abnormal findings in urine: Secondary | ICD-10-CM | POA: Diagnosis present

## 2023-09-20 DIAGNOSIS — N952 Postmenopausal atrophic vaginitis: Secondary | ICD-10-CM

## 2023-09-20 DIAGNOSIS — R350 Frequency of micturition: Secondary | ICD-10-CM | POA: Diagnosis not present

## 2023-09-20 LAB — POCT URINALYSIS DIPSTICK
Bilirubin, UA: NEGATIVE
Glucose, UA: NEGATIVE
Ketones, UA: NEGATIVE
Nitrite, UA: POSITIVE
Protein, UA: NEGATIVE
Spec Grav, UA: 1.01 (ref 1.010–1.025)
Urobilinogen, UA: 0.2 U/dL
pH, UA: 6 (ref 5.0–8.0)

## 2023-09-20 MED ORDER — NITROFURANTOIN MONOHYD MACRO 100 MG PO CAPS
100.0000 mg | ORAL_CAPSULE | Freq: Every day | ORAL | 5 refills | Status: DC
Start: 2023-09-20 — End: 2024-03-31

## 2023-09-20 MED ORDER — CEPHALEXIN 500 MG PO CAPS
500.0000 mg | ORAL_CAPSULE | Freq: Three times a day (TID) | ORAL | 0 refills | Status: AC
Start: 2023-09-20 — End: 2023-09-25

## 2023-09-20 MED ORDER — METHENAMINE HIPPURATE 1 G PO TABS
1.0000 g | ORAL_TABLET | Freq: Two times a day (BID) | ORAL | 11 refills | Status: DC
Start: 2023-09-20 — End: 2024-07-24

## 2023-09-20 NOTE — Progress Notes (Signed)
[facility] Return Visit  SUBJECTIVE  History of Present Illness: Alexis Wallace is a 67 y.o. female seen in follow-up for recurrent UTI and overactive bladder.   In March had symptoms of UTI, but culture returned negative. She feels she is sensitive to any changes in her bladder. She did have a positive UTI in July and September (both E. Coli). She has been taking the estrace cream 2-3 times a week and the methenamine daily.   Had a renal ultrasound 09/2022 which showed growth of a known left renal cyst, now  5.4 x 7.6 x 5.8 cm today versus 3.8 x 4.0 x 3.8 cm in 2013.   Past Medical History: Patient  has a past medical history of Allergy, Anemia, iron deficiency, B12 deficiency, CAP (community acquired pneumonia) (11/18/2014), Heartburn, Iron deficiency, Osteoarthritis, Osteopenia, Solitary pulmonary nodule (12/05/2014), Tibial plateau fracture, and Urinary tract infection.   Past Surgical History: She  has a past surgical history that includes Gastric bypass (2001); Cosmetic surgery; carpal tunnel right; Colonoscopy; and Polypectomy.   Medications: She has a current medication list which includes the following prescription(s): calcium carbonate, cephalexin, cephalexin, cholecalciferol, cyanocobalamin, estradiol, estradiol, xiidra, multivitamin, nitrofurantoin (macrocrystal-monohydrate), omeprazole, progesterone, rosuvastatin, wegovy, temazepam, valacyclovir, tyrvaya, aspirin ec, fluticasone, methenamine, and progesterone.   Allergies: Patient has No Known Allergies.   Social History: Patient  reports that she quit smoking about 45 years ago. Her smoking use included cigarettes. She started smoking about 50 years ago. She has a 5 pack-year smoking history. She has never used smokeless tobacco. She reports that she does not drink alcohol and does not use drugs.      OBJECTIVE     Physical Exam: Vitals:   09/20/23 0802  BP: 117/69  Pulse: 75    Gen: No apparent  distress, A&O x 3.  Detailed Urogynecologic Evaluation:  Deferred.    POC urine: Small leukocytes, positive nitrites  ASSESSMENT AND PLAN    Alexis Wallace is a 67 y.o. with:  1. Urinary frequency   2. Recurrent UTI   3. Vaginal atrophy   4. Acute UTI      Recurrent UTI - For acute UTI, prescribed Keflex 500mg  TID x days - Then restart nitrofurantoin 100mg  daily x 6 months for prevention - continue estrace cream 0.5g 2-3 times a week - Continue methenamine - If she has symptoms of UTI, she should obtain a urine culture- either with PCP or urgent care - Discussed avoidance of bladder irritants such as caffeine and artificial sweeteners  Follow up 6 months  Marguerita Beards, MD

## 2023-09-20 NOTE — Patient Instructions (Addendum)
Today we talked about ways to manage bladder urgency such as altering your diet to avoid irritative beverages and foods (bladder diet) as well as attempting to decrease stress and other exacerbating factors.    The Most Bothersome Foods* The Least Bothersome Foods*  Coffee - Regular & Decaf Tea - caffeinated Carbonated beverages - cola, non-colas, diet & caffeine-free Alcohols - Beer, Red Wine, White Wine, [address] - Grapefruit, [place], Orange, [employer] - Cranberry, Grapefruit, Orange, Pineapple Vegetables - Tomato & Tomato Products Flavor Enhancers - Hot peppers, Spicy foods, Chili, Horseradish, Vinegar, Monosodium glutamate (MSG) Artificial Sweeteners - NutraSweet, Sweet 'N Low, Equal (sweetener), Saccharin Ethnic foods - [place], [place], [place] food [employer] - low-fat & whole Fruits - Bananas, Blueberries, Honeydew melon, Pears, Raisins, Watermelon Vegetables - Broccoli, [address] Sprouts, [place], Carrots, Cauliflower, [place], Cucumber, Mushrooms, Peas, Radishes, Squash, Zucchini, White potatoes, Sweet potatoes & yams Poultry - Chicken, Eggs, [place], [employer] - Beef, Diplomatic Services operational officer, Lamb Seafood - Shrimp, [place] fish, Salmon Grains - Oat, Rice Snacks - Pretzels, Popcorn  *Lenward Chancellor et al. Diet and its role in interstitial cystitis/bladder pain syndrome (IC/BPS) and comorbid conditions. [employer]. BJU Int. 2012 Jan 11.   Take keflex for 5 days for UTI For UTI prevention start nitrofurantoin after daily for 6 months.  Continue estrace cream and methenamine Can also start D-mannose OTC for prevention

## 2023-09-20 NOTE — Addendum Note (Signed)
Addended by: Thressa Sheller on: 09/20/2023 08:37 AM   Modules accepted: Orders

## 2023-09-22 DIAGNOSIS — H2511 Age-related nuclear cataract, right eye: Secondary | ICD-10-CM | POA: Diagnosis not present

## 2023-09-22 DIAGNOSIS — H25811 Combined forms of age-related cataract, right eye: Secondary | ICD-10-CM | POA: Diagnosis not present

## 2023-09-22 LAB — URINE CULTURE: Culture: 40000 — AB

## 2023-10-21 ENCOUNTER — Other Ambulatory Visit (HOSPITAL_BASED_OUTPATIENT_CLINIC_OR_DEPARTMENT_OTHER): Payer: Self-pay

## 2023-10-22 ENCOUNTER — Ambulatory Visit: Payer: Medicare PPO | Admitting: Obstetrics and Gynecology

## 2023-10-25 ENCOUNTER — Encounter: Payer: Self-pay | Admitting: Family Medicine

## 2023-10-25 NOTE — Telephone Encounter (Signed)
See below

## 2023-10-29 ENCOUNTER — Telehealth: Payer: Self-pay

## 2023-10-29 NOTE — Telephone Encounter (Signed)
Pharmacy Patient Advocate Encounter  Received notification from [facility] that Prior Authorization for [facility] 2.4mg /0.75ml has been DENIED.  Full Denial letter indexed to media tab.

## 2023-10-29 NOTE — Telephone Encounter (Signed)
FYI

## 2023-10-30 NOTE — Telephone Encounter (Signed)
Please let patient know- we could also try to use Ozempic 2 mg for her diabetes

## 2023-11-01 ENCOUNTER — Encounter: Payer: Self-pay | Admitting: Family Medicine

## 2023-11-01 NOTE — Telephone Encounter (Signed)
Pt has been on this medication for a while, can this be appealed?

## 2023-11-02 NOTE — Telephone Encounter (Signed)
Pt has responded via mychart.

## 2023-11-10 ENCOUNTER — Other Ambulatory Visit (HOSPITAL_COMMUNITY): Payer: Self-pay

## 2023-11-10 NOTE — Telephone Encounter (Addendum)
Patient started the prior authorization process and was denied because the patient did not give information needed to complete request, this is why the PA was denied. Trying to submit the PA correctly was blocked because [employer] is stating there is an open claim. Not sure if the patient has tried to initiate another claim but she may have to contact plan if she has done so.

## 2023-11-11 ENCOUNTER — Telehealth: Payer: Self-pay

## 2023-11-11 NOTE — Telephone Encounter (Signed)
Additional information has been requested from the patient's insurance in order to proceed with the prior authorization request. Requested information has been sent, or form has been filled out and faxed back to [phone]

## 2023-11-11 NOTE — Telephone Encounter (Signed)
Pharmacy Patient Advocate Encounter  Received notification from [facility] that Prior Authorization for [facility] 2.4MG  has been DENIED.  Full denial letter will be uploaded to the media tab. See denial reason below.   PA #/Case ID/Reference #: [id number]

## 2023-11-11 NOTE — Telephone Encounter (Signed)
Additional information has been requested from the patient's insurance in order to proceed with the prior authorization request. Requested information has been sent, or form has been filled out and faxed back to [phone]

## 2023-11-12 NOTE — Telephone Encounter (Signed)
Once this form is received how do get it to you all?

## 2023-11-15 ENCOUNTER — Ambulatory Visit (INDEPENDENT_AMBULATORY_CARE_PROVIDER_SITE_OTHER): Payer: Medicare PPO

## 2023-11-15 VITALS — Wt 148.0 lb

## 2023-11-15 DIAGNOSIS — Z0000 Encounter for general adult medical examination without abnormal findings: Secondary | ICD-10-CM | POA: Diagnosis not present

## 2023-11-15 NOTE — Progress Notes (Signed)
Subjective:   Alexis Wallace is a 67 y.o. female who presents for Medicare Annual (Subsequent) preventive examination.  Visit Complete: Virtual I connected with  Alexis Wallace on 11/15/23 by a audio enabled telemedicine application and verified that I am speaking with the correct person using two identifiers.  Patient Location: Home  Provider Location: Office/Clinic  I discussed the limitations of evaluation and management by telemedicine. The patient expressed understanding and agreed to proceed.  Vital Signs: Because this visit was a virtual/telehealth visit, some criteria may be missing or patient reported. Any vitals not documented were not able to be obtained and vitals that have been documented are patient reported.  Patient Medicare AWV questionnaire was completed by the patient on 11/11/23; I have confirmed that all information answered by patient is correct and no changes since this date.  Cardiac Risk Factors include: advanced age (>37men, >66 women);diabetes mellitus;dyslipidemia     Objective:    Today's Vitals   11/15/23 1121  Weight: 148 lb (67.1 kg)   Body mass index is 24.63 kg/m.     11/15/2023   11:28 AM 07/23/2023   10:37 AM 11/09/2022   11:47 AM 10/28/2021   11:50 AM 10/24/2020    8:53 AM 10/02/2016    7:34 AM 09/15/2016    8:24 AM  Advanced Directives  Does Patient Have a Medical Advance Directive? Yes Yes Yes Yes Yes Yes Yes  Type of Estate agent of [place];Living will  Healthcare Power of [place];Living will Healthcare Power of Attorney Living will;Healthcare Power of [employer] Power of [place];Living will Healthcare Power of [place];Living will  Does patient want to make changes to medical advance directive? No - Patient declined No - Patient declined No - Patient declined      Copy of Healthcare Power of Attorney in Chart? Yes - validated most recent copy scanned in chart (See row information)  Yes -  validated most recent copy scanned in chart (See row information) Yes - validated most recent copy scanned in chart (See row information) No - copy requested No - copy requested     Current Medications (verified) Outpatient Encounter Medications as of 11/15/2023  Medication Sig   calcium carbonate 200 MG capsule Take 3,000 mg by mouth daily.   Cholecalciferol 1.25 MG (50000 UT) TABS 50,000 units PO qwk for 13 weeks.   cyanocobalamin (VITAMIN B12) 1000 MCG/ML injection INJECT ( TOTAL) INTO THE MUSCLE EVERY 30 DAYS   estradiol (ESTRACE) 0.1 MG/GM vaginal cream Place 0.5 g vaginally 2 (two) times a week. Place 0.5g nightly for two weeks then twice a week after   estradiol (ESTRACE) 1 MG tablet Through GYN Dr. Adalberto Ill   methenamine (HIPREX) 1 g tablet Take 1 tablet (1 g total) by mouth 2 (two) times daily with a meal.   Multiple Vitamin (MULTIVITAMIN) tablet Take 1 tablet by mouth daily.   nitrofurantoin, macrocrystal-monohydrate, (MACROBID) 100 MG capsule Take 1 capsule (100 mg total) by mouth daily.   omeprazole (PRILOSEC) 20 MG capsule TAKE ONE CAPSULE BY MOUTH DAILY   progesterone (PROMETRIUM) 200 MG capsule Take 1 capsule (200 mg total) by mouth at bedtime.   rosuvastatin (CRESTOR) 5 MG tablet Take 2 tablets (10 mg total) by mouth daily.   Semaglutide-Weight Management (WEGOVY) 2.4 MG/0.75ML SOAJ Inject 2.4 mg into the skin once a week.   temazepam (RESTORIL) 15 MG capsule TAKE ONE CAPSULE (15MG  TOTAL) BY MOUTH AT BEDTIME AS NEEDED FOR SLEEP. Do not drive for  at least 8 hours after taking   valACYclovir (VALTREX) 1000 MG tablet Take 2 pills twice a day for 1 day at first sign of cold sore   Varenicline Tartrate (TYRVAYA) 0.03 MG/ACT SOLN Place 0.3 % into the nose 2 (two) times daily.   [DISCONTINUED] aspirin EC 81 MG tablet Take 81 mg by mouth daily. Swallow whole.   [DISCONTINUED] cephALEXin (KEFLEX) 500 MG capsule Take 1 capsule (500 mg total) by mouth 3 (three) times daily.    [DISCONTINUED] fluticasone (FLONASE) 50 MCG/ACT nasal spray USE 2 SPRAYS IN EACH NOSTRIL ONCE DAILY   [DISCONTINUED] Lifitegrast (XIIDRA) 5 % SOLN Apply 5 % to eye 2 (two) times daily.   [DISCONTINUED] progesterone (PROMETRIUM) 200 MG capsule TAKE ONE CAPSULE BY MOUTH DAILY AT BEDTIME.   No facility-administered encounter medications on file as of 11/15/2023.    Allergies (verified) Patient has no known allergies.   History: Past Medical History:  Diagnosis Date   Allergy    Anemia, iron deficiency    B12 deficiency    CAP (community acquired pneumonia) 11/18/2014   Heartburn    Iron deficiency    Osteoarthritis    Osteopenia    Solitary pulmonary nodule 12/05/2014   Dx 11/19/14  3 mm > f/u 11/27/2015 > resolved, no further studies rec     Tibial plateau fracture    june 2020- murphy/wainer cared for her   Urinary tract infection    Past Surgical History:  Procedure Laterality Date   carpal tunnel right     COLONOSCOPY     COSMETIC SURGERY     tummy tuck   GASTRIC BYPASS  2001   POLYPECTOMY     Family History  Problem Relation Age of Onset   Lung cancer Mother        stage IV, smoker   COPD Mother    Heart attack Father 51   Heart disease Father        started 97 cad- died at 103   Heart disease Sister        Hypertrophic cardiomyopathy   Cancer Sister    Hypertension Sister    Uterine cancer Sister    Hypertrophic cardiomyopathy Sister    Asthma Sister    Heart disease Sister    Colon cancer Neg Hx    Esophageal cancer Neg Hx    Rectal cancer Neg Hx    Stomach cancer Neg Hx    Colon polyps Neg Hx    Social History   Socioeconomic History   Marital status: Married    Spouse name: Not on file   Number of children: 2   Years of education: Not on file   Highest education level: Master's degree (e.g., MA, MS, MEng, MEd, MSW, MBA)  Occupational History   Not on file  Tobacco Use   Smoking status: Former    Current packs/day: 0.00    Average packs/day: 1  pack/day for 5.0 years (5.0 ttl pk-yrs)    Types: Cigarettes    Start date: 02/10/1973    Quit date: 02/10/1978    Years since quitting: 45.7   Smokeless tobacco: Never  Vaping Use   Vaping status: Never Used  Substance and Sexual Activity   Alcohol use: No   Drug use: No   Sexual activity: Yes    Birth control/protection: None  Other Topics Concern   Not on file  Social History Narrative   Married to [employer]. 2 kids. Jonny Ruiz (his wife had TBI being  struck by car) with daughter Julious Oka. Patient takes care of Julious Oka and son lives with her and husband) and Judeth Cornfield lives in [place] with no kids.       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  Raising granddaughter after daughter has TBI.       Hobbies: spending time with friends and family          Social Determinants of Health   Financial Resource Strain: Low Risk  (11/11/2023)   Overall Financial Resource Strain (CARDIA)    Difficulty of Paying Living Expenses: Not hard at all  Food Insecurity: No Food Insecurity (11/11/2023)   Hunger Vital Sign    Worried About Running Out of Food in the Last Year: Never true    Ran Out of Food in the Last Year: Never true  Transportation Needs: No Transportation Needs (11/11/2023)   PRAPARE - Administrator, Civil Service (Medical): No    Lack of Transportation (Non-Medical): No  Physical Activity: Insufficiently Active (11/11/2023)   Exercise Vital Sign    Days of Exercise per Week: 3 days    Minutes of Exercise per Session: 30 min  Stress: Stress Concern Present (11/11/2023)   [employer] of Occupational Health - Occupational Stress Questionnaire    Feeling of Stress : To some extent  Social Connections: Moderately Integrated (11/11/2023)   Social Connection and Isolation Panel [NHANES]    Frequency of Communication with Friends and Family: More than three times a week     Frequency of Social Gatherings with Friends and Family: Three times a week    Attends Religious Services: Never    Active Member of Clubs or Organizations: Yes    Attends Engineer, structural: More than 4 times per year    Marital Status: Married    Tobacco Counseling Counseling given: Not Answered   Clinical Intake:  Pre-visit preparation completed: Yes  Pain : No/denies pain     BMI - recorded: 24.63 Nutritional Status: BMI of 19-24  Normal Nutritional Risks: None Diabetes: Yes CBG done?: No Did pt. bring in CBG monitor from home?: No  How often do you need to have someone help you when you read instructions, pamphlets, or other written materials from your doctor or pharmacy?: 1 - Never  Interpreter Needed?: No  Information entered by :: Lanier Ensign, LPN   Activities of Daily Living    11/11/2023   10:56 AM  In your present state of health, do you have any difficulty performing the following activities:  Hearing? 0  Vision? 0  Difficulty concentrating or making decisions? 0  Walking or climbing stairs? 0  Dressing or bathing? 0  Doing errands, shopping? 0  Preparing Food and eating ? N  Using the Toilet? N  In the past six months, have you accidently leaked urine? N  Do you have problems with loss of bowel control? N  Managing your Medications? N  Managing your Finances? N  Housekeeping or managing your Housekeeping? N    Patient Care Team: Shelva Majestic, MD as PCP - General (Family Medicine)  Indicate any recent Medical Services you may have received from other than [facility] providers in the past year (date may be approximate).     Assessment:   This is a routine wellness examination for Annastyn.  Hearing/Vision screen Hearing Screening - Comments:: Pt denies any hearing issues  Vision Screening -  Comments:: Pt follows up with Dr Cathey Endow for annual eye exams    Goals Addressed             This Visit's Progress    Patient Stated        Maintain health and activity        Depression Screen    11/15/2023   11:26 AM 08/27/2023    8:57 AM 11/09/2022   11:46 AM 10/28/2021   11:35 AM 07/31/2021    2:48 PM 02/24/2021    2:57 PM 10/24/2020    8:52 AM  PHQ 2/9 Scores  PHQ - 2 Score 0 0 0 0 0 0 0  PHQ- 9 Score  2         Fall Risk    11/11/2023   10:56 AM 08/27/2023    8:57 AM 11/09/2022   11:48 AM 11/05/2022    9:04 AM 10/28/2021   11:36 AM  Fall Risk   Falls in the past year? 0 0 0 0 0  Number falls in past yr:  0 0 0 0  Injury with Fall?  0 0 0 0  Risk for fall due to : No Fall Risks No Fall Risks Impaired vision  Impaired vision  Follow up Falls prevention discussed Education provided Falls prevention discussed  Falls prevention discussed    MEDICARE RISK AT HOME: Medicare Risk at Home Any stairs in or around the home?: Yes If so, are there any without handrails?: No Home free of loose throw rugs in walkways, pet beds, electrical cords, etc?: Yes Adequate lighting in your home to reduce risk of falls?: Yes Life alert?: No Use of a cane, walker or w/c?: No Grab bars in the bathroom?: No Shower chair or bench in shower?: No Elevated toilet seat or a handicapped toilet?: Yes  TIMED UP AND GO:  Was the test performed?  No    Cognitive Function:        11/15/2023   11:29 AM 11/09/2022   11:49 AM 10/28/2021   11:37 AM  6CIT Screen  What Year? 0 points 0 points 0 points  What month? 0 points 0 points 0 points  What time? 0 points 0 points 0 points  Count back from 20 0 points 0 points 0 points  Months in reverse 0 points 0 points 0 points  Repeat phrase 0 points 0 points 0 points  Total Score 0 points 0 points 0 points    Immunizations Immunization History  Administered Date(s) Administered   Fluad Quad(high Dose 65+) 08/04/2019, 09/09/2020, 08/28/2022   Influenza Split 10/03/2012   Influenza Whole 09/06/2001, 10/12/2007, 09/25/2008, 09/01/2010   Influenza, High Dose Seasonal PF 09/08/2023    Influenza,inj,Quad PF,6+ Mos 08/28/2013, 08/30/2014   Influenza-Unspecified 09/21/2015, 08/07/2016, 09/24/2021   Moderna Covid-19 Vaccine Bivalent Booster 104yrs & up 10/04/2021   Moderna SARS-COV2 Booster Vaccination 09/16/2022   PFIZER Comirnaty(Gray Top)Covid-19 Tri-Sucrose Vaccine 04/14/2021   PFIZER(Purple Top)SARS-COV-2 Vaccination 12/26/2019, 01/16/2020, 07/31/2020   PNEUMOCOCCAL CONJUGATE-20 02/04/2022   Pneumococcal Polysaccharide-23 08/04/2019   Respiratory Syncytial Virus Vaccine,Recomb Aduvanted(Arexvy) 09/16/2022   Td 06/07/2003   Tdap 12/10/2015   Zoster Recombinant(Shingrix) 09/25/2019, 08/01/2021    TDAP status: Up to date  Flu Vaccine status: Up to date  Pneumococcal vaccine status: Up to date  Covid-19 vaccine status: Information provided on how to obtain vaccines.   Qualifies for Shingles Vaccine? Yes   Zostavax completed Yes   Shingrix Completed?: Yes  Screening Tests Health Maintenance  Topic Date Due  COVID-19 Vaccine (6 - 2023-24 season) 08/08/2023   HEMOGLOBIN A1C  02/24/2024   Diabetic kidney evaluation - Urine ACR  03/07/2024   OPHTHALMOLOGY EXAM  08/19/2024   Diabetic kidney evaluation - eGFR measurement  08/26/2024   FOOT EXAM  08/26/2024   Medicare Annual Wellness (AWV)  11/14/2024   MAMMOGRAM  02/17/2025   DTaP/Tdap/Td (3 - Td or Tdap) 12/09/2025   Colonoscopy  08/23/2027   Pneumonia Vaccine 71+ Years old  Completed   INFLUENZA VACCINE  Completed   DEXA SCAN  Completed   Hepatitis C Screening  Completed   Zoster Vaccines- Shingrix  Completed   HPV VACCINES  Aged Out    Health Maintenance  Health Maintenance Due  Topic Date Due   COVID-19 Vaccine (6 - 2023-24 season) 08/08/2023    Colorectal cancer screening: Type of screening: Colonoscopy. Completed 08/22/20. Repeat every 7 years  Mammogram status: Completed 02/18/23. Repeat every year  Bone Density status: Completed 01/17/21. Results reflect: Bone density results: OSTEOPOROSIS.  Repeat every 2 years.   Additional Screening:  Hepatitis C Screening: Completed 12/10/15  Vision Screening: Recommended annual ophthalmology exams for early detection of glaucoma and other disorders of the eye. Is the patient up to date with their annual eye exam?  Yes  Who is the provider or what is the name of the office in which the patient attends annual eye exams? Dr Cathey Endow  If pt is not established with a provider, would they like to be referred to a provider to establish care? No .   Dental Screening: Recommended annual dental exams for proper oral hygiene  Diabetic Foot Exam: Diabetic Foot Exam: Completed 08/27/23  Community Resource Referral / Chronic Care Management: CRR required this visit?  No   CCM required this visit?  No     Plan:     I have personally reviewed and noted the following in the patient's chart:   Medical and social history Use of alcohol, tobacco or illicit drugs  Current medications and supplements including opioid prescriptions. Patient is not currently taking opioid prescriptions. Functional ability and status Nutritional status Physical activity Advanced directives List of other physicians Hospitalizations, surgeries, and ER visits in previous 12 months Vitals Screenings to include cognitive, depression, and falls Referrals and appointments  In addition, I have reviewed and discussed with patient certain preventive protocols, quality metrics, and best practice recommendations. A written personalized care plan for preventive services as well as general preventive health recommendations were provided to patient.     Marzella Schlein, LPN   16/12/958   After Visit Summary: (MyChart) Due to this being a telephonic visit, the after visit summary with patients personalized plan was offered to patient via MyChart   Nurse Notes: none

## 2023-11-15 NOTE — Patient Instructions (Addendum)
Alexis Wallace , Thank you for taking time to come for your Medicare Wellness Visit. I appreciate your ongoing commitment to your health goals. Please review the following plan we discussed and let me know if I can assist you in the future.   Referrals/Orders/Follow-Ups/Clinician Recommendations: Aim for 30 minutes of exercise or brisk walking, 6-8 glasses of water, and 5 servings of fruits and vegetables each day. Maintain health and activity   This is a list of the screening recommended for you and due dates:  Health Maintenance  Topic Date Due   COVID-19 Vaccine (6 - 2023-24 season) 08/08/2023   Hemoglobin A1C  02/24/2024   Yearly kidney health urinalysis for diabetes  03/07/2024   Eye exam for diabetics  08/19/2024   Yearly kidney function blood test for diabetes  08/26/2024   Complete foot exam   08/26/2024   Medicare Annual Wellness Visit  11/14/2024   Mammogram  02/17/2025   DTaP/Tdap/Td vaccine (3 - Td or Tdap) 12/09/2025   Colon Cancer Screening  08/23/2027   Pneumonia Vaccine  Completed   Flu Shot  Completed   DEXA scan (bone density measurement)  Completed   Hepatitis C Screening  Completed   Zoster (Shingles) Vaccine  Completed   HPV Vaccine  Aged Out    Advanced directives: (In Chart) A copy of your advanced directives are scanned into your chart should your provider ever need it.  Next Medicare Annual Wellness Visit scheduled for next year: Yes

## 2023-12-02 DIAGNOSIS — L820 Inflamed seborrheic keratosis: Secondary | ICD-10-CM | POA: Diagnosis not present

## 2023-12-06 ENCOUNTER — Encounter: Payer: Self-pay | Admitting: Family Medicine

## 2023-12-07 ENCOUNTER — Other Ambulatory Visit (HOSPITAL_BASED_OUTPATIENT_CLINIC_OR_DEPARTMENT_OTHER): Payer: Self-pay

## 2023-12-09 ENCOUNTER — Other Ambulatory Visit: Payer: Self-pay

## 2023-12-09 ENCOUNTER — Other Ambulatory Visit (HOSPITAL_BASED_OUTPATIENT_CLINIC_OR_DEPARTMENT_OTHER): Payer: Self-pay

## 2023-12-09 MED ORDER — OZEMPIC (2 MG/DOSE) 8 MG/3ML ~~LOC~~ SOPN: 2.0000 mg | PEN_INJECTOR | SUBCUTANEOUS | 11 refills | Status: DC

## 2023-12-14 ENCOUNTER — Other Ambulatory Visit: Payer: Self-pay | Admitting: Medical Genetics

## 2023-12-17 ENCOUNTER — Other Ambulatory Visit (HOSPITAL_COMMUNITY)
Admission: RE | Admit: 2023-12-17 | Discharge: 2023-12-17 | Disposition: A | Discharge status: Home / Self Care | Payer: Self-pay | Source: Ambulatory Visit | Attending: Medical Genetics | Admitting: Medical Genetics

## 2023-12-22 DIAGNOSIS — H25812 Combined forms of age-related cataract, left eye: Secondary | ICD-10-CM | POA: Diagnosis not present

## 2023-12-22 DIAGNOSIS — H2512 Age-related nuclear cataract, left eye: Secondary | ICD-10-CM | POA: Diagnosis not present

## 2023-12-23 ENCOUNTER — Other Ambulatory Visit: Payer: Self-pay

## 2023-12-24 ENCOUNTER — Other Ambulatory Visit: Payer: Self-pay | Admitting: Family Medicine

## 2023-12-24 ENCOUNTER — Other Ambulatory Visit (HOSPITAL_BASED_OUTPATIENT_CLINIC_OR_DEPARTMENT_OTHER): Payer: Self-pay

## 2023-12-27 LAB — GENECONNECT MOLECULAR SCREEN: Genetic Analysis Overall Interpretation: NEGATIVE

## 2024-01-05 DIAGNOSIS — M1712 Unilateral primary osteoarthritis, left knee: Secondary | ICD-10-CM | POA: Diagnosis not present

## 2024-01-07 ENCOUNTER — Encounter: Payer: Self-pay | Admitting: Family Medicine

## 2024-01-07 ENCOUNTER — Other Ambulatory Visit: Payer: Self-pay | Admitting: Family Medicine

## 2024-01-07 ENCOUNTER — Ambulatory Visit: Payer: Medicare PPO | Admitting: Family Medicine

## 2024-01-07 VITALS — BP 120/70 | HR 58 | Temp 97.1°F | Ht 65.0 in | Wt 152.0 lb

## 2024-01-07 DIAGNOSIS — Z7985 Long-term (current) use of injectable non-insulin antidiabetic drugs: Secondary | ICD-10-CM | POA: Diagnosis not present

## 2024-01-07 DIAGNOSIS — L308 Other specified dermatitis: Secondary | ICD-10-CM | POA: Diagnosis not present

## 2024-01-07 DIAGNOSIS — E559 Vitamin D deficiency, unspecified: Secondary | ICD-10-CM

## 2024-01-07 DIAGNOSIS — L821 Other seborrheic keratosis: Secondary | ICD-10-CM | POA: Diagnosis not present

## 2024-01-07 DIAGNOSIS — D2271 Melanocytic nevi of right lower limb, including hip: Secondary | ICD-10-CM | POA: Diagnosis not present

## 2024-01-07 DIAGNOSIS — E785 Hyperlipidemia, unspecified: Secondary | ICD-10-CM

## 2024-01-07 DIAGNOSIS — L820 Inflamed seborrheic keratosis: Secondary | ICD-10-CM | POA: Diagnosis not present

## 2024-01-07 DIAGNOSIS — E119 Type 2 diabetes mellitus without complications: Secondary | ICD-10-CM | POA: Diagnosis not present

## 2024-01-07 DIAGNOSIS — D2362 Other benign neoplasm of skin of left upper limb, including shoulder: Secondary | ICD-10-CM | POA: Diagnosis not present

## 2024-01-07 DIAGNOSIS — I700 Atherosclerosis of aorta: Secondary | ICD-10-CM

## 2024-01-07 DIAGNOSIS — D2262 Melanocytic nevi of left upper limb, including shoulder: Secondary | ICD-10-CM | POA: Diagnosis not present

## 2024-01-07 DIAGNOSIS — I2510 Atherosclerotic heart disease of native coronary artery without angina pectoris: Secondary | ICD-10-CM

## 2024-01-07 LAB — COMPREHENSIVE METABOLIC PANEL
ALT: 24 U/L (ref 0–35)
AST: 23 U/L (ref 0–37)
Albumin: 3.9 g/dL (ref 3.5–5.2)
Alkaline Phosphatase: 77 U/L (ref 39–117)
BUN: 16 mg/dL (ref 6–23)
CO2: 28 meq/L (ref 19–32)
Calcium: 8.9 mg/dL (ref 8.4–10.5)
Chloride: 106 meq/L (ref 96–112)
Creatinine, Ser: 0.65 mg/dL (ref 0.40–1.20)
GFR: 89.46 mL/min (ref >60.00)
Glucose, Bld: 82 mg/dL (ref 70–99)
Potassium: 3.7 meq/L (ref 3.5–5.1)
Sodium: 140 meq/L (ref 135–145)
Total Bilirubin: 0.5 mg/dL (ref 0.2–1.2)
Total Protein: 6.4 g/dL (ref 6.0–8.3)

## 2024-01-07 LAB — VITAMIN D 25 HYDROXY (VIT D DEFICIENCY, FRACTURES): VITD: 38.07 ng/mL (ref 30.00–100.00)

## 2024-01-07 LAB — LDL CHOLESTEROL, DIRECT: Direct LDL: 37 mg/dL

## 2024-01-07 LAB — HEMOGLOBIN A1C: Hgb A1c MFr Bld: 5.6 % (ref 4.6–6.5)

## 2024-01-07 MED ORDER — TIRZEPATIDE 5 MG/0.5ML ~~LOC~~ SOAJ
5.0000 mg | SUBCUTANEOUS | 5 refills | Status: DC
  Filled 2024-01-11: qty 6, 84d supply, fill #0

## 2024-01-07 NOTE — Progress Notes (Signed)
Phone [phone] In person visit   Subjective:   Alexis Wallace is a 68 y.o. year old very pleasant female patient who presents for/with See problem oriented charting Chief Complaint  Patient presents with   Weight Loss    Pt wants to discuss weight loss drugs.   Past Medical History-  Patient Active Problem List   Diagnosis Date Noted   Diabetes mellitus, type II (HCC) 03/08/2023    Priority: High   Nonobstructive CAD 09/09/2022    Priority: High   Recurrent UTI 04/07/2016    Priority: High   Aortic atherosclerosis (HCC) 09/09/2022    Priority: Medium    Vitamin D deficiency 08/28/2022    Priority: Medium    Hyperlipidemia 05/20/2017    Priority: Medium    History of adenomatous polyp of colon 10/12/2016    Priority: Medium    Rosacea 08/30/2014    Priority: Medium    Insomnia 08/30/2014    Priority: Medium    Bariatric surgery status 09/01/2010    Priority: Medium    DETRUSOR, 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    Priority: Medium    ANEMIA, B12 DEFICIENCY 08/15/2007    Priority: Medium    Plantar fasciitis, right 12/13/2014    Priority: Low   Solitary pulmonary nodule 12/05/2014    Priority: Low   CAP (community acquired pneumonia) 11/18/2014    Priority: Low   Family history of hypertrophic cardiomyopathy 05/13/2011    Priority: Low   Actinic keratosis 10/22/2010    Priority: Low   ALLERGIC RHINITIS 03/01/2008    Priority: Low   GERD 06/02/2007    Priority: Low   OSTEOARTHRITIS 06/02/2007    Priority: Low   OSTEOPENIA 06/02/2007    Priority: Low    Medications- reviewed and updated Current Outpatient Medications  Medication Sig Dispense Refill   calcium carbonate 200 MG capsule Take 3,000 mg by mouth daily.     Cholecalciferol 1.25 MG (50000 UT) TABS 50,000 units PO qwk for 13 weeks. 13 tablet 1   cyanocobalamin (VITAMIN B12) 1000 MCG/ML injection INJECT ( TOTAL) INTO THE MUSCLE EVERY 30 DAYS 3 mL 3   estradiol (ESTRACE) 0.1 MG/GM vaginal  cream Place 0.5 g vaginally 2 (two) times a week. Place 0.5g nightly for two weeks then twice a week after 30 g 11   estradiol (ESTRACE) 1 MG tablet Through GYN Dr. Adalberto Ill     methenamine (HIPREX) 1 g tablet Take 1 tablet (1 g total) by mouth 2 (two) times daily with a meal. 60 tablet 11   Multiple Vitamin (MULTIVITAMIN) tablet Take 1 tablet by mouth daily.     nitrofurantoin, macrocrystal-monohydrate, (MACROBID) 100 MG capsule Take 1 capsule (100 mg total) by mouth daily. 30 capsule 5   progesterone (PROMETRIUM) 200 MG capsule Take 1 capsule (200 mg total) by mouth at bedtime. 90 capsule 1   rosuvastatin (CRESTOR) 5 MG tablet Take 2 tablets (10 mg total) by mouth daily. 90 tablet 3   temazepam (RESTORIL) 15 MG capsule TAKE ONE CAPSULE (15MG  TOTAL) BY MOUTH AT BEDTIME AS NEEDED FOR SLEEP. Do not drive for at least 8 hours after taking 30 capsule 5   tirzepatide (MOUNJARO) 5 MG/0.5ML Pen Inject 5 mg into the skin once a week. 6 mL 5   Varenicline Tartrate (TYRVAYA) 0.03 MG/ACT SOLN Place 0.3 % into the nose 2 (two) times daily.     omeprazole (PRILOSEC) 20 MG capsule TAKE ONE CAPSULE BY MOUTH DAILY 90 capsule 3   valACYclovir (VALTREX)  1000 MG tablet Take 2 pills twice a day for 1 day at first sign of cold sore 30 tablet 1   No current facility-administered medications for this visit.     Objective:  BP 120/70   Pulse (!) 58   Temp (!) 97.1 F (36.2 C)   Ht 5\' 5"  (1.651 m)   Wt 152 lb (68.9 kg)   SpO2 100%   BMI 25.29 kg/m  Gen: NAD, resting comfortably Nasal turbinates erythematous and edematous with clear discharge, oropharynx largely normal, tympanic membranes normal bilaterally CV: RRR no murmurs rubs or gallops Lungs: CTAB no crackles, wheeze, rhonchi Ext: no edema Skin: warm, dry     Assessment and Plan    # Upper respiratory illness S:granddaughter sick earlier in week- she's had some cough and congestion for 24 hours.   A/P: Patient with upper respiratory illness.   Offered flu and COVID testing but she declines and reports symptoms are very mild-she wants to monitor and follow-up only if symptoms worsen   #Nonobstructive CAD-on CT cardiac scoring 09/03/2022-Coronary calcium score is 269 and this is at percentile 88  #hyperlipidemia #Aortic atherosclerosis S: Medication:Rosuvastatin 20 mg daily--> 10 mg after weight loss and have even discussed option of going to 5 mg and she reduced to this last time - First visit with Dr. Duke Salvia 10/27/2022 - was released  Lab Results  Component Value Date   CHOL 129 08/27/2023   HDL 82.50 08/27/2023   LDLCALC 35 08/27/2023   LDLDIRECT 84.0 12/10/2015   TRIG 58.0 08/27/2023   CHOLHDL 2 08/27/2023   A/P: aortic atherosclerosis (presumed stable)- LDL goal ideally <70-but LDL was below 40 and we reduced dose to rosuvastatin 5 mg-update direct LDL today to determine if still at goal with lower dose-likely continue current medication Her nonobstructive coronary disease remains asymptomatic-continue to monitor and cardiology has released her -Could consider repeat in 2028 of CT calcium scoring  # Diabetes- recovered diagnosis from prior records- brings note from [facility] associated with diet controled diabetes listed - 08/04/2007 S: Medication: has been diet controlled but recently on wegovy for weight loss and later transition to Ozempic 2 mg-she has had 4 pounds of weight gain after switching from [facility] 2.4 mg to the Ozempic 2 mg. Did better with the applicator on Wegovy- this is difficult for her to use -planning to cut out cookies Lab Results  Component Value Date   HGBA1C 5.3 08/27/2023   HGBA1C 5.1 03/08/2023   A/P: Has been well-controlled-update A1c with labs today.  Having difficulty with the applicator for Ozempic and is having some weight gain on Ozempic compared to [facility] and would like to keep her stable-trial Mounjaro 5 mg that discussed may have to titrate dose and may need prior authorization -Insurance  suggested [place] but she has a history of recurrent UTIs and this would not be advised #Vitamin D deficiency S: Medication: Has required high-dose vitamin D in the past and almost on a continual basis-restarted her high-dose for 6 months in September Lab Results  Component Value Date   VD25OH 28.87 (L) 08/27/2023  A/P: Still on high-dose for now-check levels and if not improving may need to switch to daily  Recommended follow up: Physical in late September or later recommended Future Appointments  Date Time Provider Department Center  09/08/2024  9:00 AM Shelva Majestic, MD [facility]  11/22/2024 10:40 AM [facility] ANNUAL WELLNESS VISIT 1 [facility]    Lab/Order associations:   ICD-10-CM  1. Type 2 diabetes mellitus without complication, without long-term current use of insulin (HCC) Chronic E11.9 Hemoglobin A1c    Comprehensive metabolic panel    2. Hyperlipidemia, unspecified hyperlipidemia type  E78.5 LDL cholesterol, direct    Comprehensive metabolic panel    3. Vitamin D deficiency  E55.9 VITAMIN D 25 Hydroxy (Vit-D Deficiency, Fractures)    4. Coronary artery disease involving native coronary artery of native heart without angina pectoris  I25.10     5. Aortic atherosclerosis (HCC)  I70.0       Meds ordered this encounter  Medications   tirzepatide (MOUNJARO) 5 MG/0.5ML Pen    Sig: Inject 5 mg into the skin once a week.    Dispense:  6 mL    Refill:  5    Not candidate for jardiance due to recurrent UTIs- send to prior authorization team within [facility] if needed. Failed Ozempic- struggles with applicator    Return precautions advised.  Tana Conch, MD

## 2024-01-07 NOTE — Patient Instructions (Addendum)
Stop Ozempic if Mounjaro covered- trying for prior authorization  Please stop by lab before you go If you have mychart- we will send your results within 3 business days of [place] receiving them.  If you do not have mychart- we will call you about results within 5 business days of [place] receiving them.  *please also note that you will see labs on mychart as soon as they post. I will later go in and write notes on them- will say "notes from Dr. Durene Cal"   Recommended follow up: September 21 or later for physical- schedule on way out and can cancel march visit

## 2024-01-07 NOTE — Addendum Note (Signed)
Addended by: Shelva Majestic on: 01/07/2024 05:05 PM   Modules accepted: Level of Service

## 2024-01-11 ENCOUNTER — Other Ambulatory Visit (HOSPITAL_BASED_OUTPATIENT_CLINIC_OR_DEPARTMENT_OTHER): Payer: Self-pay

## 2024-01-12 ENCOUNTER — Encounter: Payer: Self-pay | Admitting: Family Medicine

## 2024-01-12 ENCOUNTER — Ambulatory Visit: Payer: Medicare PPO | Admitting: Family Medicine

## 2024-01-12 VITALS — BP 138/74 | HR 69 | Temp 98.1°F | Ht 65.0 in | Wt 156.4 lb

## 2024-01-12 DIAGNOSIS — J208 Acute bronchitis due to other specified organisms: Secondary | ICD-10-CM | POA: Diagnosis not present

## 2024-01-12 DIAGNOSIS — R051 Acute cough: Secondary | ICD-10-CM | POA: Diagnosis not present

## 2024-01-12 LAB — POCT INFLUENZA A/B
Influenza A, POC: NEGATIVE
Influenza B, POC: NEGATIVE

## 2024-01-12 LAB — POC COVID19 BINAXNOW: SARS Coronavirus 2 Ag: NEGATIVE

## 2024-01-12 NOTE — Patient Instructions (Signed)
Please follow up if symptoms do not improve or as needed.    Acute Bronchitis, Adult  Acute bronchitis is sudden inflammation of the main airways (bronchi) that come off the windpipe (trachea) in the lungs. The swelling causes the airways to get smaller and make more mucus than normal. This can make it hard to breathe and can cause coughing or noisy breathing (wheezing). Acute bronchitis may last several weeks. The cough may last longer. Allergies, asthma, and exposure to smoke may make the condition worse. What are the causes? This condition can be caused by germs and by substances that irritate the lungs, including: Cold and flu viruses. The most common cause of this condition is the virus that causes the common cold. Bacteria. This is less common. Breathing in substances that irritate the lungs, including: Smoke from cigarettes and other forms of tobacco. Dust and pollen. Fumes from household cleaning products, gases, or burned fuel. Indoor or outdoor air pollution. What increases the risk? The following factors may make you more likely to develop this condition: A weak body's defense system, also called the immune system. A condition that affects your lungs and breathing, such as asthma. What are the signs or symptoms? Common symptoms of this condition include: Coughing. This may bring up clear, yellow, or green mucus from your lungs (sputum). Wheezing. Runny or stuffy nose. Having too much mucus in your lungs (chest congestion). Shortness of breath. Aches and pains, including sore throat or chest. How is this diagnosed? This condition is usually diagnosed based on: Your symptoms and medical history. A physical exam. You may also have other tests, including tests to rule out other conditions, such as pneumonia. These tests include: A test of lung function. Test of a mucus sample to look for the presence of bacteria. Tests to check the oxygen level in your blood. Blood  tests. Chest X-ray. How is this treated? Most cases of acute bronchitis clear up over time without treatment. Your health care provider may recommend: Drinking more fluids to help thin your mucus so it is easier to cough up. Taking inhaled medicine (inhaler) to improve air flow in and out of your lungs. Using a vaporizer or a humidifier. These are machines that add water to the air to help you breathe better. Taking a medicine that thins mucus and clears congestion (expectorant). Taking a medicine that prevents or stops coughing (cough suppressant). It is not common to take an antibiotic medicine for this condition. Follow these instructions at home:  Take over-the-counter and prescription medicines only as told by your health care provider. Use an inhaler, vaporizer, or humidifier as told by your health care provider. Take two teaspoons (10 mL) of honey at bedtime to lessen coughing at night. Drink enough fluid to keep your urine pale yellow. Do not use any products that contain nicotine or tobacco. These products include cigarettes, chewing tobacco, and vaping devices, such as e-cigarettes. If you need help quitting, ask your health care provider. Get plenty of rest. Return to your normal activities as told by your health care provider. Ask your health care provider what activities are safe for you. Keep all follow-up visits. This is important. How is this prevented? To lower your risk of getting this condition again: Wash your hands often with soap and water for at least 20 seconds. If soap and water are not available, use hand sanitizer. Avoid contact with people who have cold symptoms. Try not to touch your mouth, nose, or eyes with your hands.  Avoid breathing in smoke or chemical fumes. Breathing smoke or chemical fumes will make your condition worse. Get the flu shot every year. Contact a health care provider if: Your symptoms do not improve after 2 weeks. You have trouble  coughing up the mucus. Your cough keeps you awake at night. You have a fever. Get help right away if you: Cough up blood. Feel pain in your chest. Have severe shortness of breath. Faint or keep feeling like you are going to faint. Have a severe headache. Have a fever or chills that get worse. These symptoms may represent a serious problem that is an emergency. Do not wait to see if the symptoms will go away. Get medical help right away. Call your local emergency services (911 in the [place] Do not drive yourself to the hospital. Summary Acute bronchitis is inflammation of the main airways (bronchi) that come off the windpipe (trachea) in the lungs. The swelling causes the airways to get smaller and make more mucus than normal. Drinking more fluids can help thin your mucus so it is easier to cough up. Take over-the-counter and prescription medicines only as told by your health care provider. Do not use any products that contain nicotine or tobacco. These products include cigarettes, chewing tobacco, and vaping devices, such as e-cigarettes. If you need help quitting, ask your health care provider. Contact a health care provider if your symptoms do not improve after 2 weeks. This information is not intended to replace advice given to you by your health care provider. Make sure you discuss any questions you have with your health care provider. Document Revised: 03/05/2022 Document Reviewed: 03/26/2021 Elsevier Patient Education  2024 [employer].

## 2024-01-12 NOTE — Progress Notes (Signed)
 Subjective  CC:  Chief Complaint  Patient presents with   post-nasal drip   Cough    Pt stated that she has had a cough since last wed and she seen [facility] for this but she is not getting any better,   Same day acute visit; PCP not available. New pt to me. Chart reviewed.   HPI: SUBJECTIVE:  Alexis Wallace is a 68 y.o. female who complains of congestion,with constant runny nose, clear drainage, post nasal drip, cough described as dry and mild  and denies sinus, high fevers, SOB, sinus pain, pleuritic chest pain or significant GI symptoms. Symptoms have been present for 4-5 days. Sister is currently being treated for walking pneumonia. No GI sxs. No myalgias. She denies a history of anorexia, dizziness, vomiting and wheezing. She denies a history of asthma or COPD. Patient does not smoke cigarettes.  Assessment  1. Viral bronchitis   2. Acute cough      Plan  Discussion:  Treat supportively with otc meds. Reassured. Exam is normal outside of nasal congestion. Covid and flu testing in office negative today.  Education regarding differences between viral and bacterial infections and treatment options are discussed.  Supportive care measures are recommended.  We discussed the use of mucolytic's, decongestants, antihistamines and antitussives as needed.  Tylenol or Advil are recommended if needed.  Follow up: prn   Orders Placed This Encounter  Procedures   POC COVID-19   POCT Influenza A/B   No orders of the defined types were placed in this encounter.     I reviewed the patients updated PMH, FH, and SocHx.  Social History: Patient  reports that she quit smoking about 45 years ago. Her smoking use included cigarettes. She started smoking about 50 years ago. She has a 5 pack-year smoking history. She has never used smokeless tobacco. She reports that she does not drink alcohol and does not use drugs.  Patient Active Problem List   Diagnosis Date Noted   Diabetes mellitus, type  II (HCC) 03/08/2023   Aortic atherosclerosis (HCC) 09/09/2022   Nonobstructive CAD 09/09/2022   Vitamin D  deficiency 08/28/2022   Hyperlipidemia 05/20/2017   History of adenomatous polyp of colon 10/12/2016   Recurrent UTI 04/07/2016   Plantar fasciitis, right 12/13/2014   Solitary pulmonary nodule 12/05/2014   CAP (community acquired pneumonia) 11/18/2014   Rosacea 08/30/2014   Insomnia 08/30/2014   Family history of hypertrophic cardiomyopathy 05/13/2011   Actinic keratosis 10/22/2010   Bariatric surgery status 09/01/2010   DETRUSOR, 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   ALLERGIC RHINITIS 03/01/2008   ANEMIA, B12 DEFICIENCY 08/15/2007   GERD 06/02/2007   OSTEOARTHRITIS 06/02/2007   OSTEOPENIA 06/02/2007    Review of Systems: Cardiovascular: negative for chest pain Respiratory: negative for SOB or hemoptysis Gastrointestinal: negative for abdominal pain Genitourinary: negative for dysuria or gross hematuria + cold/hot chills/sweats  Current Meds  Medication Sig   calcium  carbonate 200 MG capsule Take 3,000 mg by mouth daily.   Cholecalciferol  1.25 MG (50000 UT) TABS 50,000 units PO qwk for 13 weeks.   cyanocobalamin  (VITAMIN B12) 1000 MCG/ML injection INJECT ( TOTAL) INTO THE MUSCLE EVERY 30 DAYS   estradiol  (ESTRACE ) 0.1 MG/GM vaginal cream Place 0.5 g vaginally 2 (two) times a week. Place 0.5g nightly for two weeks then twice a week after   estradiol  (ESTRACE ) 1 MG tablet Through GYN Dr. Celesta   methenamine  (HIPREX ) 1 g tablet Take 1 tablet (1 g total) by mouth 2 (two) times  daily with a meal.   Multiple Vitamin (MULTIVITAMIN) tablet Take 1 tablet by mouth daily.   nitrofurantoin , macrocrystal-monohydrate, (MACROBID ) 100 MG capsule Take 1 capsule (100 mg total) by mouth daily.   omeprazole  (PRILOSEC) 20 MG capsule TAKE ONE CAPSULE BY MOUTH DAILY   progesterone  (PROMETRIUM ) 200 MG capsule Take 1 capsule (200 mg total) by mouth at bedtime.   rosuvastatin  (CRESTOR ) 5 MG  tablet Take 2 tablets (10 mg total) by mouth daily.   temazepam  (RESTORIL ) 15 MG capsule TAKE ONE CAPSULE (15MG  TOTAL) BY MOUTH AT BEDTIME AS NEEDED FOR SLEEP. Do not drive for at least 8 hours after taking   tirzepatide  (MOUNJARO ) 5 MG/0.5ML Pen Inject 5 mg into the skin once a week.   Varenicline Tartrate (TYRVAYA) 0.03 MG/ACT SOLN Place 0.3 % into the nose 2 (two) times daily.    Objective  Vitals: BP 138/74   Pulse 69   Temp 98.1 F (36.7 C)   Ht 5' 5 (1.651 m)   Wt 156 lb 6.4 oz (70.9 kg)   SpO2 93%   BMI 26.03 kg/m  General: no acute distress , appears well Psych:  Alert and oriented, normal mood and affect HEENT:  Normocephalic, atraumatic, supple neck, moist mucous membranes, no cervical lad, supple neck Cardiovascular:  RRR without murmur. no edema Respiratory:  Good breath sounds bilaterally, CTAB with normal respiratory effort with occasional rhonchi Office Visit on 01/12/2024  Component Date Value Ref Range Status   SARS Coronavirus 2 Ag 01/12/2024 Negative  Negative Final   Influenza A, POC 01/12/2024 Negative  Negative Final   Influenza B, POC 01/12/2024 Negative  Negative Final     Commons side effects, risks, benefits, and alternatives for medications and treatment plan prescribed today were discussed, and the patient expressed understanding of the given instructions. Patient is instructed to call or message via MyChart if he/she has any questions or concerns regarding our treatment plan. No barriers to understanding were identified. We discussed Red Flag symptoms and signs in detail. Patient expressed understanding regarding what to do in case of urgent or emergency type symptoms.  Medication list was reconciled, printed and provided to the patient in AVS. Patient instructions and summary information was reviewed with the patient as documented in the AVS. This note was prepared with assistance of Dragon voice recognition software. Occasional wrong-word or sound-a-like  substitutions may have occurred due to the inherent limitations of voice recognition software

## 2024-01-24 ENCOUNTER — Other Ambulatory Visit (HOSPITAL_BASED_OUTPATIENT_CLINIC_OR_DEPARTMENT_OTHER): Payer: Self-pay

## 2024-02-07 ENCOUNTER — Other Ambulatory Visit: Payer: Self-pay | Admitting: Family Medicine

## 2024-02-09 ENCOUNTER — Other Ambulatory Visit: Payer: Self-pay

## 2024-02-09 ENCOUNTER — Other Ambulatory Visit (HOSPITAL_BASED_OUTPATIENT_CLINIC_OR_DEPARTMENT_OTHER): Payer: Self-pay

## 2024-02-09 ENCOUNTER — Encounter: Payer: Self-pay | Admitting: Family Medicine

## 2024-02-09 MED ORDER — TIRZEPATIDE 7.5 MG/0.5ML ~~LOC~~ SOAJ
7.5000 mg | SUBCUTANEOUS | 5 refills | Status: DC
  Filled 2024-02-09: qty 6, 84d supply, fill #0
  Filled 2024-03-28 – 2024-05-03 (×3): qty 6, 84d supply, fill #1
  Filled 2024-07-13: qty 6, 84d supply, fill #2

## 2024-02-10 ENCOUNTER — Other Ambulatory Visit (HOSPITAL_BASED_OUTPATIENT_CLINIC_OR_DEPARTMENT_OTHER): Payer: Self-pay

## 2024-02-22 ENCOUNTER — Other Ambulatory Visit: Payer: Self-pay

## 2024-02-22 ENCOUNTER — Other Ambulatory Visit (HOSPITAL_BASED_OUTPATIENT_CLINIC_OR_DEPARTMENT_OTHER): Payer: Self-pay

## 2024-02-22 DIAGNOSIS — Z124 Encounter for screening for malignant neoplasm of cervix: Secondary | ICD-10-CM | POA: Diagnosis not present

## 2024-02-22 DIAGNOSIS — M816 Localized osteoporosis [Lequesne]: Secondary | ICD-10-CM | POA: Diagnosis not present

## 2024-02-22 DIAGNOSIS — Z6824 Body mass index (BMI) 24.0-24.9, adult: Secondary | ICD-10-CM | POA: Diagnosis not present

## 2024-02-22 DIAGNOSIS — N958 Other specified menopausal and perimenopausal disorders: Secondary | ICD-10-CM | POA: Diagnosis not present

## 2024-02-22 DIAGNOSIS — R87612 Low grade squamous intraepithelial lesion on cytologic smear of cervix (LGSIL): Secondary | ICD-10-CM | POA: Diagnosis not present

## 2024-02-22 DIAGNOSIS — N959 Unspecified menopausal and perimenopausal disorder: Secondary | ICD-10-CM | POA: Diagnosis not present

## 2024-02-22 LAB — HM MAMMOGRAPHY

## 2024-02-22 MED ORDER — PROGESTERONE MICRONIZED 100 MG PO CAPS
100.0000 mg | ORAL_CAPSULE | Freq: Every day | ORAL | 3 refills | Status: AC
  Filled 2024-02-22: qty 90, 90d supply, fill #0

## 2024-02-22 MED ORDER — PROGESTERONE 200 MG PO CAPS
200.0000 mg | ORAL_CAPSULE | Freq: Every day | ORAL | 3 refills | Status: DC
  Filled 2024-02-22 (×2): qty 90, 90d supply, fill #0

## 2024-02-22 MED ORDER — PROGESTERONE MICRONIZED 100 MG PO CAPS
200.0000 mg | ORAL_CAPSULE | Freq: Every day | ORAL | 3 refills | Status: AC
Start: 2024-02-22 — End: ?
  Filled 2024-02-22 – 2024-05-03 (×2): qty 180, 90d supply, fill #0
  Filled 2024-10-30: qty 180, 90d supply, fill #1

## 2024-02-25 ENCOUNTER — Ambulatory Visit: Payer: Medicare PPO | Admitting: Family Medicine

## 2024-02-29 DIAGNOSIS — M810 Age-related osteoporosis without current pathological fracture: Secondary | ICD-10-CM | POA: Diagnosis not present

## 2024-03-06 ENCOUNTER — Other Ambulatory Visit (HOSPITAL_BASED_OUTPATIENT_CLINIC_OR_DEPARTMENT_OTHER): Payer: Self-pay

## 2024-03-06 DIAGNOSIS — H10411 Chronic giant papillary conjunctivitis, right eye: Secondary | ICD-10-CM | POA: Diagnosis not present

## 2024-03-06 DIAGNOSIS — H00012 Hordeolum externum right lower eyelid: Secondary | ICD-10-CM | POA: Diagnosis not present

## 2024-03-06 MED ORDER — NEOMYCIN-POLYMYXIN-DEXAMETH 3.5-10000-0.1 OP SUSP
1.0000 [drp] | Freq: Four times a day (QID) | OPHTHALMIC | 0 refills | Status: DC
Start: 2024-03-06 — End: 2024-03-31
  Filled 2024-03-06: qty 5, 25d supply, fill #0

## 2024-03-06 MED ORDER — AZITHROMYCIN 250 MG PO TABS
ORAL_TABLET | ORAL | 0 refills | Status: AC
Start: 2024-03-06 — End: 2024-03-11
  Filled 2024-03-06: qty 6, 5d supply, fill #0

## 2024-03-08 DIAGNOSIS — M818 Other osteoporosis without current pathological fracture: Secondary | ICD-10-CM | POA: Diagnosis not present

## 2024-03-28 ENCOUNTER — Ambulatory Visit

## 2024-03-28 ENCOUNTER — Encounter: Payer: Self-pay | Admitting: Family Medicine

## 2024-03-28 ENCOUNTER — Other Ambulatory Visit (HOSPITAL_BASED_OUTPATIENT_CLINIC_OR_DEPARTMENT_OTHER): Payer: Self-pay

## 2024-03-28 ENCOUNTER — Ambulatory Visit: Admitting: Family Medicine

## 2024-03-28 VITALS — BP 120/60 | HR 63 | Temp 97.8°F | Ht 65.0 in | Wt 147.8 lb

## 2024-03-28 DIAGNOSIS — Z9884 Bariatric surgery status: Secondary | ICD-10-CM | POA: Diagnosis not present

## 2024-03-28 DIAGNOSIS — D518 Other vitamin B12 deficiency anemias: Secondary | ICD-10-CM

## 2024-03-28 DIAGNOSIS — E119 Type 2 diabetes mellitus without complications: Secondary | ICD-10-CM | POA: Diagnosis not present

## 2024-03-28 DIAGNOSIS — M549 Dorsalgia, unspecified: Secondary | ICD-10-CM

## 2024-03-28 DIAGNOSIS — I2510 Atherosclerotic heart disease of native coronary artery without angina pectoris: Secondary | ICD-10-CM | POA: Diagnosis not present

## 2024-03-28 DIAGNOSIS — R5383 Other fatigue: Secondary | ICD-10-CM

## 2024-03-28 DIAGNOSIS — K909 Intestinal malabsorption, unspecified: Secondary | ICD-10-CM

## 2024-03-28 DIAGNOSIS — R079 Chest pain, unspecified: Secondary | ICD-10-CM

## 2024-03-28 LAB — MICROALBUMIN / CREATININE URINE RATIO
Creatinine,U: 71.8 mg/dL
Microalb Creat Ratio: 26.9 mg/g (ref 0.0–30.0)
Microalb, Ur: 1.9 mg/dL (ref 0.0–1.9)

## 2024-03-28 NOTE — Patient Instructions (Addendum)
 X-ray today   Please stop by lab before you go If you have mychart- we will send your results within 3 business days of us  receiving them.  If you do not have mychart- we will call you about results within 5 business days of us  receiving them.  *please also note that you will see labs on mychart as soon as they post. I will later go in and write notes on them- will say "notes from Dr. Arlene Ben"   Recommended follow up: Return for next already scheduled visit or sooner if needed.

## 2024-03-28 NOTE — Progress Notes (Signed)
 Phone [phone] In person visit   Subjective:   Alexis Wallace is a 68 y.o. year old very pleasant female patient who presents for/with See problem oriented charting Chief Complaint  Patient presents with   extreme fatigue    Pt c/o being really tired that is getting worse.   Past Medical History-  Patient Active Problem List   Diagnosis Date Noted   Diabetes mellitus, type II (HCC) 03/08/2023    Priority: High   Nonobstructive CAD 09/09/2022    Priority: High   Recurrent UTI 04/07/2016    Priority: High   Aortic atherosclerosis (HCC) 09/09/2022    Priority: Medium    Vitamin D  deficiency 08/28/2022    Priority: Medium    Hyperlipidemia 05/20/2017    Priority: Medium    History of adenomatous polyp of colon 10/12/2016    Priority: Medium    Rosacea 08/30/2014    Priority: Medium    Insomnia 08/30/2014    Priority: Medium    Bariatric surgery status 09/01/2010    Priority: Medium    DETRUSOR, 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    Priority: Medium    ANEMIA, B12 DEFICIENCY 08/15/2007    Priority: Medium    Plantar fasciitis, right 12/13/2014    Priority: Low   Solitary pulmonary nodule 12/05/2014    Priority: Low   CAP (community acquired pneumonia) 11/18/2014    Priority: Low   Family history of hypertrophic cardiomyopathy 05/13/2011    Priority: Low   Actinic keratosis 10/22/2010    Priority: Low   ALLERGIC RHINITIS 03/01/2008    Priority: Low   GERD 06/02/2007    Priority: Low   OSTEOARTHRITIS 06/02/2007    Priority: Low   OSTEOPENIA 06/02/2007    Priority: Low    Medications- reviewed and updated Current Outpatient Medications  Medication Sig Dispense Refill   calcium  carbonate 200 MG capsule Take 3,000 mg by mouth daily.     Cholecalciferol  1.25 MG (50000 UT) TABS 50,000 units PO qwk for 13 weeks. 13 tablet 1   cyanocobalamin  (VITAMIN B12) 1000 MCG/ML injection INJECT ( TOTAL) INTO THE MUSCLE EVERY 30 DAYS 3 mL 3   estradiol  (ESTRACE ) 0.1  MG/GM vaginal cream Place 0.5 g vaginally 2 (two) times a week. Place 0.5g nightly for two weeks then twice a week after 30 g 11   estradiol  (ESTRACE ) 1 MG tablet Through GYN Dr. Gina Lagos     methenamine  (HIPREX ) 1 g tablet Take 1 tablet (1 g total) by mouth 2 (two) times daily with a meal. 60 tablet 11   Multiple Vitamin (MULTIVITAMIN) tablet Take 1 tablet by mouth daily.     neomycin -polymyxin b-dexamethasone  (MAXITROL ) 3.5-10000-0.1 SUSP Place 1 drop into the right eye 4 (four) times daily for 5 days 5 mL 0   nitrofurantoin , macrocrystal-monohydrate, (MACROBID ) 100 MG capsule Take 1 capsule (100 mg total) by mouth daily. 30 capsule 5   omeprazole  (PRILOSEC) 20 MG capsule TAKE ONE CAPSULE BY MOUTH DAILY 90 capsule 3   progesterone  (PROMETRIUM ) 100 MG capsule Take 2 capsules (200 mg total) by mouth daily. 180 capsule 3   progesterone  (PROMETRIUM ) 100 MG capsule Take 1 capsule (100 mg total) by mouth daily as directed. 90 capsule 3   progesterone  (PROMETRIUM ) 200 MG capsule Take 1 capsule (200 mg total) by mouth daily. 90 capsule 3   rosuvastatin  (CRESTOR ) 5 MG tablet Take 2 tablets (10 mg total) by mouth daily. 90 tablet 3   temazepam  (RESTORIL ) 15 MG capsule TAKE ONE CAPSULE (15MG   TOTAL) BY MOUTH AT BEDTIME AS NEEDED FOR SLEEP 30 capsule 5   tirzepatide  (MOUNJARO ) 7.5 MG/0.5ML Pen Inject 7.5 mg into the skin once a week. 6 mL 5   Varenicline Tartrate (TYRVAYA) 0.03 MG/ACT SOLN Place 0.3 % into the nose 2 (two) times daily.     No current facility-administered medications for this visit.     Objective:  BP 120/60   Pulse 63   Temp 97.8 F (36.6 C)   Ht 5\' 5"  (1.651 m)   Wt 147 lb 12.8 oz (67 kg)   SpO2 99%   BMI 24.60 kg/m  Gen: NAD, resting comfortably CV: RRR no murmurs rubs or gallops No chest wall pain noted Lungs: CTAB no crackles, wheeze, rhonchi Abdomen: soft/nontender/nondistended/normal bowel sounds. No rebound or guarding.  Ext: no edema Skin: warm, dry     Assessment  and Plan   # Fatigue #Left chest/left upper back pain S:fatigue has been going on for last 2 months or so- worsened more drastically in last 3-4 weeks. No cough/congestion other than some mild allergies- Flonase  helped. No fever/chills but cold natured -also has noted some left upper back pain with deep breaths but feels deeper and can radiate to the chest. No pain if takes normal breath other than certain positions can trigger it. Does not recur with exertion like walking down hall or upstairs. No history deep vein thrombosis or PULMONARY EMBOLISM.   A/P: Patient with fatigue over the last 2 months-worried about potential vitamin deficiency especially with gastric bypass and also being on Mounjaro -they have option of coming off Mounjaro  but she wants to hold off-she is interested in testing though and we will also check TSH with send cold intolerance  Some pleuritic chest pain over the last few weeks-wonder about pneumonia or pleurisy-she agrees to chest x-ray.  No calf pain or swelling to suggest DVT/PE-and she opts out of evaluation for this at this time   #Nonobstructive CAD-on CT cardiac scoring 09/03/2022-Coronary calcium  score is 269 and this is at percentile 88  #hyperlipidemia S: Medication:Rosuvastatin  20 mg daily--> 5 mg after weight loss - First visit with Dr. Theodis Fiscal 10/27/2022 - was released -Lipoprotein a not elevated  Lab Results  Component Value Date   CHOL 129 08/27/2023   HDL 82.50 08/27/2023   LDLCALC 35 08/27/2023   LDLDIRECT 37.0 01/07/2024   TRIG 58.0 08/27/2023   CHOLHDL 2 08/27/2023   A/P: -Does have nonobstructive CAD noted in 2023 but has been released from cardiology and no exertional element pain so doubt cardiac in origin.  Also not short of breath -Lipids been well-controlled and discussed could likely even go down to every other day dosing-may reconsider after physical  # Diabetes- recovered diagnosis from prior records- brings note from [facility] associated  with diet controled diabetes listed - 08/04/2007 S: Medication: has been diet controlled but recently on Mounjaro  for weight loss   CBGs- does not monitor  Lab Results  Component Value Date   HGBA1C 5.6 01/07/2024   HGBA1C 5.3 08/27/2023   HGBA1C 5.1 03/08/2023   A/P: Diabetes very well-controlled-too soon for repeat  #Status post-bariatric surgery S:medication: Mounjaro  also helpful in maintaining weight A/P: Concern for malabsorption as above-update labs   # B12 deficiency S: Current treatment/medication (oral vs. IM): Home injections monthly Lab Results  Component Value Date   VITAMINB12 418 08/27/2023  A/P: Has been well-controlled and remains on monthly injections-hold off on repeat today but will check B1  #Vitamin D  deficiency S:  Medication: Has required high-dose vitamin D  in the past A/P: Doing well recently-last vitamin D  was normal within this year-hold off on repeat  Recommended follow up: Return for next already scheduled visit or sooner if needed. Future Appointments  Date Time Provider Department Center  03/31/2024  8:20 AM Arma Lamp, MD [facility]  09/08/2024  9:00 AM Almira Jaeger, MD [facility]  11/22/2024 10:40 AM [facility] ANNUAL WELLNESS VISIT 1 [facility]    Lab/Order associations:   ICD-10-CM   1. Status post bariatric surgery  Z98.84 CBC    Comprehensive metabolic panel with GFR    Vitamin B12    PTH, intact and calcium     Vitamin B1    Folate RBC    Zinc     Copper , serum    Iron, TIBC and Ferritin Panel    2. Intestinal malabsorption, unspecified type  K90.9 CBC    Comprehensive metabolic panel with GFR    Vitamin B12    PTH, intact and calcium     Vitamin B1    Folate RBC    Zinc     Copper , serum    Iron, TIBC and Ferritin Panel    3. Type 2 diabetes mellitus without complication, without long-term current use of insulin (HCC)  E11.9 Urine Microalbumin w/creat. ratio    CBC    Comprehensive metabolic panel with GFR     4. Upper back pain on left side  M54.9 DG Chest 2 View    5. Left-sided chest pain  R07.9 DG Chest 2 View    6. Fatigue, unspecified type  R53.83 TSH      No orders of the defined types were placed in this encounter.   Return precautions advised.  Clarisa Crooked, MD

## 2024-03-29 ENCOUNTER — Encounter: Payer: Self-pay | Admitting: Family Medicine

## 2024-03-29 LAB — COMPREHENSIVE METABOLIC PANEL WITH GFR
ALT: 18 U/L (ref 0–35)
AST: 28 U/L (ref 0–37)
Albumin: 3.9 g/dL (ref 3.5–5.2)
Alkaline Phosphatase: 71 U/L (ref 39–117)
BUN: 11 mg/dL (ref 6–23)
CO2: 25 meq/L (ref 19–32)
Calcium: 8.1 mg/dL — ABNORMAL LOW (ref 8.4–10.5)
Chloride: 107 meq/L (ref 96–112)
Creatinine, Ser: 0.51 mg/dL (ref 0.40–1.20)
GFR: 94.7 mL/min (ref >60.00)
Glucose, Bld: 86 mg/dL (ref 70–99)
Potassium: 4.2 meq/L (ref 3.5–5.1)
Sodium: 136 meq/L (ref 135–145)
Total Bilirubin: 0.5 mg/dL (ref 0.2–1.2)
Total Protein: 6.4 g/dL (ref 6.0–8.3)

## 2024-03-29 LAB — CBC
HCT: 36.1 % (ref 36.0–46.0)
Hemoglobin: 12.1 g/dL (ref 12.0–15.0)
MCHC: 33.5 g/dL (ref 30.0–36.0)
MCV: 92.8 fl (ref 78.0–100.0)
Platelets: 194 10*3/uL (ref 150.0–400.0)
RBC: 3.88 Mil/uL (ref 3.87–5.11)
RDW: 13 % (ref 11.5–15.5)
WBC: 3.8 10*3/uL — ABNORMAL LOW (ref 4.0–10.5)

## 2024-03-29 LAB — VITAMIN B12: Vitamin B-12: 533 pg/mL (ref 211–911)

## 2024-03-29 LAB — TSH: TSH: 1.08 u[IU]/mL (ref 0.35–5.50)

## 2024-03-30 ENCOUNTER — Encounter: Payer: Self-pay | Admitting: Family Medicine

## 2024-03-31 ENCOUNTER — Ambulatory Visit: Admitting: Obstetrics and Gynecology

## 2024-03-31 ENCOUNTER — Encounter: Payer: Self-pay | Admitting: Obstetrics and Gynecology

## 2024-03-31 DIAGNOSIS — N3020 Other chronic cystitis without hematuria: Secondary | ICD-10-CM | POA: Diagnosis not present

## 2024-03-31 DIAGNOSIS — Z87440 Personal history of urinary (tract) infections: Secondary | ICD-10-CM

## 2024-03-31 DIAGNOSIS — N390 Urinary tract infection, site not specified: Secondary | ICD-10-CM

## 2024-03-31 MED ORDER — NITROFURANTOIN MONOHYD MACRO 100 MG PO CAPS: 100.0000 mg | ORAL_CAPSULE | Freq: Every day | ORAL | 1 refills | Status: DC

## 2024-03-31 NOTE — Progress Notes (Signed)
 [facility] Return Visit  SUBJECTIVE  History of Present Illness: Alexis Wallace is a 68 y.o. female seen in follow-up for recurrent UTI and overactive bladder.   Has been on nitrofurantoin  prophylaxis and has not had any UTIs in the last 6 months. Denies any current symptoms. She is using estrace  cream twice a week (rx refilled by Dr Serge Dancer), and methenamine  twice a day. Prior to this 6 months she was using the methenamine  and estrace  cream but still had several UTIs.   Had a renal ultrasound 09/2022 which showed growth of a known left renal cyst, now  5.4 x 7.6 x 5.8 cm today versus 3.8 x 4.0 x 3.8 cm in 2013.   Past Medical History: Patient  has a past medical history of Allergy, Anemia, iron deficiency, B12 deficiency, CAP (community acquired pneumonia) (11/18/2014), Heartburn, Iron deficiency, Osteoarthritis, Osteopenia, Solitary pulmonary nodule (12/05/2014), Tibial plateau fracture, and Urinary tract infection.   Past Surgical History: She  has a past surgical history that includes Gastric bypass (2001); Cosmetic surgery; carpal tunnel right; Colonoscopy; Polypectomy; and Cataract extraction (Bilateral).   Medications: She has a current medication list which includes the following prescription(s): calcium  carbonate, cholecalciferol , cyanocobalamin , estradiol , estradiol , methenamine , multivitamin, omeprazole , progesterone , progesterone , rosuvastatin , temazepam , tirzepatide , tyrvaya, and nitrofurantoin  (macrocrystal-monohydrate).   Allergies: Patient has no known allergies.   Social History: Patient  reports that she quit smoking about 46 years ago. Her smoking use included cigarettes. She started smoking about 51 years ago. She has a 5 pack-year smoking history. She has never used smokeless tobacco. She reports that she does not drink alcohol and does not use drugs.      OBJECTIVE     Physical Exam: Vitals:   03/31/24 0827  BP: 124/76  Pulse: 67     Gen:  No apparent distress, A&O x 3.  Detailed Urogynecologic Evaluation:  Deferred.     ASSESSMENT AND PLAN    Alexis Wallace is a 68 y.o. with:  1. Recurrent UTI     Recurrent UTI - Will continue with nitrofurantoin  100mg  daily x 6 months for prevention - continue estrace  cream 0.5g 2 times a week - Stop methenamine  for now since previously was not preventing infections alone and the pills are large for her. Will consider restarting if needed.  - If she has symptoms of UTI, she should obtain a urine culture- either with PCP or urgent care and then notify us .   Follow up 6 months or sooner if needed  Arma Lamp, MD  Time spent: I spent 23 minutes dedicated to the care of this patient on the date of this encounter to include pre-visit review of records, face-to-face time with the patient and post visit documentation and ordering medication/ testing.

## 2024-04-01 LAB — PTH, INTACT AND CALCIUM
Calcium: 8.1 mg/dL — ABNORMAL LOW (ref 8.6–10.4)
PTH: 166 pg/mL — ABNORMAL HIGH (ref 16–77)

## 2024-04-01 LAB — COPPER, SERUM: Copper: 111 ug/dL (ref 70–175)

## 2024-04-01 LAB — ZINC: Zinc: 61 ug/dL (ref 60–130)

## 2024-04-01 LAB — FOLATE RBC: RBC Folate: 607 ng/mL (ref >280)

## 2024-04-01 LAB — VITAMIN B1: Vitamin B1 (Thiamine): <6 nmol/L — ABNORMAL LOW (ref 8–30)

## 2024-04-01 LAB — IRON,TIBC AND FERRITIN PANEL
%SAT: 10 % — ABNORMAL LOW (ref 16–45)
Ferritin: 6 ng/mL — ABNORMAL LOW (ref 16–288)
Iron: 38 ug/dL — ABNORMAL LOW (ref 45–160)
TIBC: 391 ug/dL (ref 250–450)

## 2024-04-04 ENCOUNTER — Encounter: Payer: Self-pay | Admitting: Family Medicine

## 2024-04-11 ENCOUNTER — Encounter: Payer: Self-pay | Admitting: Family Medicine

## 2024-04-17 ENCOUNTER — Other Ambulatory Visit: Payer: Self-pay | Admitting: Family Medicine

## 2024-04-26 ENCOUNTER — Encounter: Payer: Self-pay | Admitting: Family Medicine

## 2024-04-26 DIAGNOSIS — M1712 Unilateral primary osteoarthritis, left knee: Secondary | ICD-10-CM | POA: Diagnosis not present

## 2024-05-03 ENCOUNTER — Other Ambulatory Visit: Payer: Self-pay

## 2024-05-03 ENCOUNTER — Other Ambulatory Visit (HOSPITAL_BASED_OUTPATIENT_CLINIC_OR_DEPARTMENT_OTHER): Payer: Self-pay

## 2024-05-04 ENCOUNTER — Other Ambulatory Visit (HOSPITAL_BASED_OUTPATIENT_CLINIC_OR_DEPARTMENT_OTHER): Payer: Self-pay

## 2024-05-04 MED ORDER — COMIRNATY 30 MCG/0.3ML IM SUSY
0.3000 mL | PREFILLED_SYRINGE | Freq: Once | INTRAMUSCULAR | 0 refills | Status: AC
  Filled 2024-05-04: qty 0.3, 1d supply, fill #0

## 2024-05-05 ENCOUNTER — Encounter: Payer: Self-pay | Admitting: Family Medicine

## 2024-05-05 DIAGNOSIS — R87612 Low grade squamous intraepithelial lesion on cytologic smear of cervix (LGSIL): Secondary | ICD-10-CM | POA: Diagnosis not present

## 2024-05-05 DIAGNOSIS — N72 Inflammatory disease of cervix uteri: Secondary | ICD-10-CM | POA: Diagnosis not present

## 2024-05-15 DIAGNOSIS — M1712 Unilateral primary osteoarthritis, left knee: Secondary | ICD-10-CM | POA: Diagnosis not present

## 2024-05-22 DIAGNOSIS — M1712 Unilateral primary osteoarthritis, left knee: Secondary | ICD-10-CM | POA: Diagnosis not present

## 2024-05-29 DIAGNOSIS — M1712 Unilateral primary osteoarthritis, left knee: Secondary | ICD-10-CM | POA: Diagnosis not present

## 2024-06-02 ENCOUNTER — Other Ambulatory Visit: Payer: Self-pay | Admitting: Family Medicine

## 2024-06-12 ENCOUNTER — Encounter: Payer: Self-pay | Admitting: Family Medicine

## 2024-06-12 DIAGNOSIS — E611 Iron deficiency: Secondary | ICD-10-CM

## 2024-06-12 DIAGNOSIS — Z9884 Bariatric surgery status: Secondary | ICD-10-CM

## 2024-07-03 ENCOUNTER — Other Ambulatory Visit (INDEPENDENT_AMBULATORY_CARE_PROVIDER_SITE_OTHER)

## 2024-07-03 ENCOUNTER — Other Ambulatory Visit

## 2024-07-03 DIAGNOSIS — Z9884 Bariatric surgery status: Secondary | ICD-10-CM | POA: Diagnosis not present

## 2024-07-03 DIAGNOSIS — E611 Iron deficiency: Secondary | ICD-10-CM

## 2024-07-03 LAB — CBC WITH DIFFERENTIAL/PLATELET
Basophils Absolute: 0 K/uL (ref 0.0–0.1)
Basophils Relative: 1.2 % (ref 0.0–3.0)
Eosinophils Absolute: 0.2 K/uL (ref 0.0–0.7)
Eosinophils Relative: 5.7 % — ABNORMAL HIGH (ref 0.0–5.0)
HCT: 35.5 % — ABNORMAL LOW (ref 36.0–46.0)
Hemoglobin: 11.8 g/dL — ABNORMAL LOW (ref 12.0–15.0)
Lymphocytes Relative: 36.4 % (ref 12.0–46.0)
Lymphs Abs: 1.4 K/uL (ref 0.7–4.0)
MCHC: 33.4 g/dL (ref 30.0–36.0)
MCV: 90.9 fl (ref 78.0–100.0)
Monocytes Absolute: 0.2 K/uL (ref 0.1–1.0)
Monocytes Relative: 4.9 % (ref 3.0–12.0)
Neutro Abs: 1.9 K/uL (ref 1.4–7.7)
Neutrophils Relative %: 51.8 % (ref 43.0–77.0)
Platelets: 164 K/uL (ref 150.0–400.0)
RBC: 3.9 Mil/uL (ref 3.87–5.11)
RDW: 13.9 % (ref 11.5–15.5)
WBC: 3.7 K/uL — ABNORMAL LOW (ref 4.0–10.5)

## 2024-07-03 LAB — COMPREHENSIVE METABOLIC PANEL WITH GFR
ALT: 21 U/L (ref 0–35)
AST: 26 U/L (ref 0–37)
Albumin: 3.6 g/dL (ref 3.5–5.2)
Alkaline Phosphatase: 36 U/L — ABNORMAL LOW (ref 39–117)
BUN: 11 mg/dL (ref 6–23)
CO2: 30 meq/L (ref 19–32)
Calcium: 8.5 mg/dL (ref 8.4–10.5)
Chloride: 105 meq/L (ref 96–112)
Creatinine, Ser: 0.53 mg/dL (ref 0.40–1.20)
GFR: 93.65 mL/min (ref >60.00)
Glucose, Bld: 86 mg/dL (ref 70–99)
Potassium: 4.1 meq/L (ref 3.5–5.1)
Sodium: 138 meq/L (ref 135–145)
Total Bilirubin: 0.5 mg/dL (ref 0.2–1.2)
Total Protein: 5.9 g/dL — ABNORMAL LOW (ref 6.0–8.3)

## 2024-07-04 LAB — IBC + FERRITIN
Ferritin: 16.7 ng/mL (ref 10.0–291.0)
Iron: 89 ug/dL (ref 42–145)
Saturation Ratios: 27.8 % (ref 20.0–50.0)
TIBC: 320.6 ug/dL (ref 250.0–450.0)
Transferrin: 229 mg/dL (ref 212.0–360.0)

## 2024-07-05 ENCOUNTER — Ambulatory Visit: Payer: Self-pay | Admitting: Family Medicine

## 2024-07-05 ENCOUNTER — Other Ambulatory Visit: Payer: Self-pay

## 2024-07-05 DIAGNOSIS — D649 Anemia, unspecified: Secondary | ICD-10-CM

## 2024-07-07 LAB — PARATHYROID HORMONE, INTACT (NO CA): PTH: 68 pg/mL (ref 16–77)

## 2024-07-07 LAB — EXTRA SPECIMEN

## 2024-07-07 LAB — VITAMIN B1

## 2024-07-12 ENCOUNTER — Other Ambulatory Visit: Payer: Self-pay

## 2024-07-12 DIAGNOSIS — D649 Anemia, unspecified: Secondary | ICD-10-CM

## 2024-07-12 DIAGNOSIS — E611 Iron deficiency: Secondary | ICD-10-CM

## 2024-07-13 ENCOUNTER — Other Ambulatory Visit

## 2024-07-13 ENCOUNTER — Encounter: Payer: Self-pay | Admitting: Family Medicine

## 2024-07-13 DIAGNOSIS — E611 Iron deficiency: Secondary | ICD-10-CM | POA: Diagnosis not present

## 2024-07-13 DIAGNOSIS — D649 Anemia, unspecified: Secondary | ICD-10-CM

## 2024-07-14 ENCOUNTER — Other Ambulatory Visit

## 2024-07-18 ENCOUNTER — Ambulatory Visit: Payer: Self-pay | Admitting: Family Medicine

## 2024-07-18 LAB — VITAMIN B1: Vitamin B1 (Thiamine): 63 nmol/L — ABNORMAL HIGH (ref 8–30)

## 2024-07-20 ENCOUNTER — Other Ambulatory Visit: Payer: Self-pay | Admitting: Family Medicine

## 2024-07-24 ENCOUNTER — Ambulatory Visit: Admitting: Family Medicine

## 2024-07-24 ENCOUNTER — Other Ambulatory Visit (HOSPITAL_BASED_OUTPATIENT_CLINIC_OR_DEPARTMENT_OTHER): Payer: Self-pay

## 2024-07-24 ENCOUNTER — Encounter: Payer: Self-pay | Admitting: Family Medicine

## 2024-07-24 VITALS — BP 102/62 | HR 65 | Temp 98.8°F | Ht 65.0 in | Wt 146.2 lb

## 2024-07-24 DIAGNOSIS — D509 Iron deficiency anemia, unspecified: Secondary | ICD-10-CM

## 2024-07-24 DIAGNOSIS — E785 Hyperlipidemia, unspecified: Secondary | ICD-10-CM

## 2024-07-24 DIAGNOSIS — Z7985 Long-term (current) use of injectable non-insulin antidiabetic drugs: Secondary | ICD-10-CM

## 2024-07-24 DIAGNOSIS — I2510 Atherosclerotic heart disease of native coronary artery without angina pectoris: Secondary | ICD-10-CM

## 2024-07-24 DIAGNOSIS — E119 Type 2 diabetes mellitus without complications: Secondary | ICD-10-CM | POA: Diagnosis not present

## 2024-07-24 MED ORDER — TIRZEPATIDE 7.5 MG/0.5ML ~~LOC~~ SOAJ
7.5000 mg | SUBCUTANEOUS | 3 refills | Status: AC
  Filled 2024-07-24 – 2024-10-15 (×3): qty 6, 84d supply, fill #0
  Filled 2025-01-06: qty 6, 84d supply, fill #1

## 2024-07-24 NOTE — Patient Instructions (Addendum)
 Refill tirzepatide  7.5 mg    Schedule labs in 1 month including a1c. Stool cards IF more anemic but hopefully improving  Recommended follow up: Return in about 6 months (around 01/24/2025) for physical or sooner if needed.Schedule b4 you leave. Can reschedule your October visit with me for 6 months from now

## 2024-07-24 NOTE — Progress Notes (Signed)
 Phone [phone] In person visit   Subjective:   Alexis Wallace is a 68 y.o. year old very pleasant female patient who presents for/with See problem oriented charting Chief Complaint  Patient presents with   Medical Management of Chronic Issues   Past Medical History-  Patient Active Problem List   Diagnosis Date Noted   Diabetes mellitus, type II (HCC) 03/08/2023    Priority: High   Nonobstructive CAD 09/09/2022    Priority: High   Recurrent UTI 04/07/2016    Priority: High   Aortic atherosclerosis (HCC) 09/09/2022    Priority: Medium    Vitamin D  deficiency 08/28/2022    Priority: Medium    Hyperlipidemia 05/20/2017    Priority: Medium    History of adenomatous polyp of colon 10/12/2016    Priority: Medium    Rosacea 08/30/2014    Priority: Medium    Insomnia 08/30/2014    Priority: Medium    Bariatric surgery status 09/01/2010    Priority: Medium    DETRUSOR, 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    Priority: Medium    ANEMIA, B12 DEFICIENCY 08/15/2007    Priority: Medium    Plantar fasciitis, right 12/13/2014    Priority: Low   Solitary pulmonary nodule 12/05/2014    Priority: Low   CAP (community acquired pneumonia) 11/18/2014    Priority: Low   Family history of hypertrophic cardiomyopathy 05/13/2011    Priority: Low   Actinic keratosis 10/22/2010    Priority: Low   ALLERGIC RHINITIS 03/01/2008    Priority: Low   GERD 06/02/2007    Priority: Low   OSTEOARTHRITIS 06/02/2007    Priority: Low   OSTEOPENIA 06/02/2007    Priority: Low    Medications- reviewed and updated Current Outpatient Medications  Medication Sig Dispense Refill   calcium  carbonate 200 MG capsule Take 3,000 mg by mouth daily.     Cholecalciferol  1.25 MG (50000 UT) TABS 50,000 units PO qwk for 13 weeks. 13 tablet 1   cyanocobalamin  (VITAMIN B12) 1000 MCG/ML injection INJECT ( TOTAL) INTO THE MUSCLE EVERY 30 DAYS 3 mL 3   estradiol  (ESTRACE ) 0.1 MG/GM vaginal cream Place 0.5 g  vaginally 2 (two) times a week. Place 0.5g nightly for two weeks then twice a week after 30 g 11   estradiol  (ESTRACE ) 1 MG tablet Through GYN Dr. Celesta     Multiple Vitamin (MULTIVITAMIN) tablet Take 1 tablet by mouth daily.     nitrofurantoin , macrocrystal-monohydrate, (MACROBID ) 100 MG capsule Take 1 capsule (100 mg total) by mouth daily. 90 capsule 1   omeprazole  (PRILOSEC) 20 MG capsule TAKE ONE CAPSULE BY MOUTH DAILY 90 capsule 3   progesterone  (PROMETRIUM ) 100 MG capsule Take 2 capsules (200 mg total) by mouth daily. 180 capsule 3   progesterone  (PROMETRIUM ) 100 MG capsule Take 1 capsule (100 mg total) by mouth daily as directed. 90 capsule 3   rosuvastatin  (CRESTOR ) 5 MG tablet TAKE TWO TABLETS (10MG  TOTAL) BY MOUTH DAILY 90 tablet 3   temazepam  (RESTORIL ) 15 MG capsule TAKE ONE CAPSULE (15MG  TOTAL) BY MOUTH AT BEDTIME AS NEEDED FOR SLEEP 30 capsule 5   Varenicline Tartrate (TYRVAYA) 0.03 MG/ACT SOLN Place 0.3 % into the nose 2 (two) times daily.     Vitamin D , Ergocalciferol , (DRISDOL ) 1.25 MG (50000 UNIT) CAPS capsule TAKE ONE CAPSULE (50,000 UNITS) BY MOUTHEVERY WEEK FOR 13 WEEKS 13 capsule 3   tirzepatide  (MOUNJARO ) 7.5 MG/0.5ML Pen Inject 7.5 mg into the skin once a week. 6 mL 3  No current facility-administered medications for this visit.     Objective:  BP 102/62 (BP Location: Right Arm, Patient Position: Sitting, Cuff Size: Normal)   Pulse 65   Temp 98.8 F (37.1 C) (Temporal)   Ht 5' 5 (1.651 m)   Wt 146 lb 3.2 oz (66.3 kg)   SpO2 96%   BMI 24.33 kg/m  Gen: NAD, resting comfortably CV: RRR no murmurs rubs or gallops Lungs: CTAB no crackles, wheeze, rhonchi Ext: trace edema Skin: warm, dry     Assessment and Plan   #Nonobstructive CAD-on CT cardiac scoring 09/03/2022-Coronary calcium  score is 269 and this is at percentile 88  #hyperlipidemia #Aortic atherosclerosis S: Medication:Rosuvastatin  20 mg daily--> 10 mg after weight loss--> 5 mg  - First visit with  Dr. Raford 10/27/2022 - was released -Lipoprotein a not elevated Lab Results  Component Value Date   CHOL 129 08/27/2023   HDL 82.50 08/27/2023   LDLCALC 35 08/27/2023   LDLDIRECT 37.0 01/07/2024   TRIG 58.0 08/27/2023   CHOLHDL 2 08/27/2023    A/P: Nonobstructive coronary disease asymptomatic with no chest pain or shortness of breath reported.  With her weight loss we have steadily decreased statin and LDL has remained at goal-we will recheck at upcoming physical  # Diabetes- recovered diagnosis from prior records- brings note from [facility] associated with diet controled diabetes listed - 08/04/2007 S: Medication: has been diet controlled but recently on Mounjaro  for weight loss at 7.5 mg   Lab Results  Component Value Date   HGBA1C 5.6 01/07/2024   HGBA1C 5.3 08/27/2023   HGBA1C 5.1 03/08/2023   A/P: Diabetes has been very well-controlled-agrees to come back in 1 month for A1c update  #Status post-bariatric surgery/iron deficiency anemia -We have discussed the lab abnormalities with combination of bariatric surgery plus Zepbound -concerned about low protein and difficulty with absorbing iron - She is okay at her current weight and okay with maintaining current dose and admits she has not been pushing protein like she should-admits this is often lost in her diet last through a shake-she agrees to try to increase this and we will recheck iron stores next month with labs -Looking back at her iron trend and hemoglobin she has actually been rather stable so I am less concerned and she was stopped up stool cards for now and the CBC in a month -Mild leukopenia-also check out in 1 month  #Insomnia S: Medication: Temazepam  15 mg-knows not to drive at least 8 hours after use - rarely needing- mainly travel A/P: Reasonable control for travel related insomnia-continue current medication-no falls reported   # B12 deficiency S: Current treatment/medication (oral vs. IM): Home injections  monthly in the past now takes B12 orally through B complex 2 a day A/P: Vitamin D  levels been adequate-he is going to try going down to 1 tablet/day of her B complex in addition to her multivitamin   Recommended follow up: We discussed labs in 1 month and then option of rescheduling physical 6 months out-Return in about 6 months (around 01/24/2025) for physical or sooner if needed.Schedule b4 you leave. Future Appointments  Date Time Provider Department Center  09/08/2024  9:00 AM Katrinka Garnette KIDD, MD [facility]  09/29/2024  8:20 AM Marilynne Rosaline SAILOR, MD [facility]  11/22/2024 10:40 AM [facility] ANNUAL WELLNESS VISIT 1 [facility]    Lab/Order associations:   ICD-10-CM   1. Type 2 diabetes mellitus without complication, without long-term current use of insulin (HCC)  E11.9  Comprehensive metabolic panel with GFR    CBC with Differential/Platelet    Hemoglobin A1c    2. Iron deficiency anemia, unspecified iron deficiency anemia type  D50.9 IBC + Ferritin    3. Coronary artery disease involving native coronary artery of native heart without angina pectoris  I25.10     4. Hyperlipidemia, unspecified hyperlipidemia type  E78.5       Meds ordered this encounter  Medications   tirzepatide  (MOUNJARO ) 7.5 MG/0.5ML Pen    Sig: Inject 7.5 mg into the skin once a week.    Dispense:  6 mL    Refill:  3    Return precautions advised.  Garnette Lukes, MD

## 2024-07-25 ENCOUNTER — Encounter: Payer: Self-pay | Admitting: Family Medicine

## 2024-07-28 DIAGNOSIS — H04123 Dry eye syndrome of bilateral lacrimal glands: Secondary | ICD-10-CM | POA: Diagnosis not present

## 2024-07-28 DIAGNOSIS — H40013 Open angle with borderline findings, low risk, bilateral: Secondary | ICD-10-CM | POA: Diagnosis not present

## 2024-07-28 DIAGNOSIS — Z961 Presence of intraocular lens: Secondary | ICD-10-CM | POA: Diagnosis not present

## 2024-07-31 DIAGNOSIS — M1712 Unilateral primary osteoarthritis, left knee: Secondary | ICD-10-CM | POA: Diagnosis not present

## 2024-08-04 DIAGNOSIS — M25562 Pain in left knee: Secondary | ICD-10-CM | POA: Diagnosis not present

## 2024-08-08 ENCOUNTER — Encounter: Payer: Self-pay | Admitting: Obstetrics and Gynecology

## 2024-08-08 MED ORDER — FOSFOMYCIN TROMETHAMINE 3 G PO PACK: 3.0000 g | PACK | Freq: Once | ORAL | 0 refills | Status: AC

## 2024-08-15 DIAGNOSIS — M810 Age-related osteoporosis without current pathological fracture: Secondary | ICD-10-CM | POA: Diagnosis not present

## 2024-08-21 DIAGNOSIS — M1712 Unilateral primary osteoarthritis, left knee: Secondary | ICD-10-CM | POA: Diagnosis not present

## 2024-09-08 ENCOUNTER — Other Ambulatory Visit

## 2024-09-08 ENCOUNTER — Encounter: Payer: Medicare PPO | Admitting: Family Medicine

## 2024-09-08 ENCOUNTER — Other Ambulatory Visit (INDEPENDENT_AMBULATORY_CARE_PROVIDER_SITE_OTHER)

## 2024-09-08 DIAGNOSIS — D509 Iron deficiency anemia, unspecified: Secondary | ICD-10-CM | POA: Diagnosis not present

## 2024-09-08 DIAGNOSIS — E119 Type 2 diabetes mellitus without complications: Secondary | ICD-10-CM

## 2024-09-09 LAB — HEMOGLOBIN A1C
Hgb A1c MFr Bld: 5 % (ref <5.7)
Mean Plasma Glucose: 97 mg/dL
eAG (mmol/L): 5.4 mmol/L

## 2024-09-11 ENCOUNTER — Other Ambulatory Visit (HOSPITAL_COMMUNITY)
Admission: RE | Admit: 2024-09-11 | Discharge: 2024-09-11 | Disposition: A | Discharge status: Home / Self Care | Source: Other Acute Inpatient Hospital | Attending: Obstetrics and Gynecology | Admitting: Obstetrics and Gynecology

## 2024-09-11 ENCOUNTER — Ambulatory Visit (INDEPENDENT_AMBULATORY_CARE_PROVIDER_SITE_OTHER)

## 2024-09-11 VITALS — BP 126/69 | HR 60

## 2024-09-11 DIAGNOSIS — R350 Frequency of micturition: Secondary | ICD-10-CM

## 2024-09-11 DIAGNOSIS — R82998 Other abnormal findings in urine: Secondary | ICD-10-CM

## 2024-09-11 LAB — POCT URINALYSIS DIP (CLINITEK)
Bilirubin, UA: NEGATIVE
Blood, UA: NEGATIVE
Glucose, UA: NEGATIVE mg/dL
Leukocytes, UA: NEGATIVE
Nitrite, UA: NEGATIVE
POC PROTEIN,UA: NEGATIVE
Spec Grav, UA: >=1.03 — AB (ref 1.010–1.025)
Urobilinogen, UA: 0.2 U/dL
pH, UA: 5.5 (ref 5.0–8.0)

## 2024-09-11 NOTE — Patient Instructions (Signed)
Your Urine dip that was done in office was Positive. I am sending the urine off for culture and you can take AZO over the counter for your discomfort.  We will contact you when the results are back between 3-5 days. If a different antibiotic is needed we will sent the order to the pharmacy and you will be notified. If you have any questions or concerns please feel free to call us at [phone]

## 2024-09-11 NOTE — Progress Notes (Signed)
 Alexis Wallace is a 68 y.o. female  arrived today with UTI sx.  A urine specimen was collected and POCT Urine was done. POCT Urine was Negative Urine was sent for culture

## 2024-09-12 ENCOUNTER — Other Ambulatory Visit: Payer: Self-pay

## 2024-09-12 ENCOUNTER — Other Ambulatory Visit (HOSPITAL_BASED_OUTPATIENT_CLINIC_OR_DEPARTMENT_OTHER): Payer: Self-pay

## 2024-09-12 DIAGNOSIS — M810 Age-related osteoporosis without current pathological fracture: Secondary | ICD-10-CM | POA: Diagnosis not present

## 2024-09-12 LAB — URINE CULTURE: Culture: <10000 — AB

## 2024-09-13 ENCOUNTER — Ambulatory Visit: Payer: Self-pay | Admitting: Family Medicine

## 2024-09-13 ENCOUNTER — Ambulatory Visit: Payer: Self-pay | Admitting: Obstetrics and Gynecology

## 2024-09-13 NOTE — Progress Notes (Signed)
 Lab called Patient @ 9:58 am: Left message to call Lab: [phone])

## 2024-09-14 ENCOUNTER — Other Ambulatory Visit: Payer: Self-pay | Admitting: Family Medicine

## 2024-09-15 ENCOUNTER — Other Ambulatory Visit (INDEPENDENT_AMBULATORY_CARE_PROVIDER_SITE_OTHER)

## 2024-09-15 ENCOUNTER — Ambulatory Visit: Payer: Self-pay | Admitting: Family Medicine

## 2024-09-15 ENCOUNTER — Other Ambulatory Visit: Payer: Self-pay | Admitting: Family Medicine

## 2024-09-15 DIAGNOSIS — D509 Iron deficiency anemia, unspecified: Secondary | ICD-10-CM

## 2024-09-15 DIAGNOSIS — E119 Type 2 diabetes mellitus without complications: Secondary | ICD-10-CM | POA: Diagnosis not present

## 2024-09-15 LAB — CBC WITH DIFFERENTIAL/PLATELET
Basophils Absolute: 0.1 K/uL (ref 0.0–0.1)
Basophils Relative: 1.4 % (ref 0.0–3.0)
Eosinophils Absolute: 0.2 K/uL (ref 0.0–0.7)
Eosinophils Relative: 3.2 % (ref 0.0–5.0)
HCT: 39 % (ref 36.0–46.0)
Hemoglobin: 13 g/dL (ref 12.0–15.0)
Lymphocytes Relative: 23.6 % (ref 12.0–46.0)
Lymphs Abs: 1.1 K/uL (ref 0.7–4.0)
MCHC: 33.2 g/dL (ref 30.0–36.0)
MCV: 95.4 fl (ref 78.0–100.0)
Monocytes Absolute: 0.3 K/uL (ref 0.1–1.0)
Monocytes Relative: 5.1 % (ref 3.0–12.0)
Neutro Abs: 3.3 K/uL (ref 1.4–7.7)
Neutrophils Relative %: 66.7 % (ref 43.0–77.0)
Platelets: 185 K/uL (ref 150.0–400.0)
RBC: 4.09 Mil/uL (ref 3.87–5.11)
RDW: 13.3 % (ref 11.5–15.5)
WBC: 4.9 K/uL (ref 4.0–10.5)

## 2024-09-15 LAB — COMPREHENSIVE METABOLIC PANEL WITH GFR
ALT: 19 U/L (ref 0–35)
AST: 22 U/L (ref 0–37)
Albumin: 4 g/dL (ref 3.5–5.2)
Alkaline Phosphatase: 36 U/L — ABNORMAL LOW (ref 39–117)
BUN: 11 mg/dL (ref 6–23)
CO2: 26 meq/L (ref 19–32)
Calcium: 8.5 mg/dL (ref 8.4–10.5)
Chloride: 105 meq/L (ref 96–112)
Creatinine, Ser: 0.55 mg/dL (ref 0.40–1.20)
GFR: 92.69 mL/min (ref >60.00)
Glucose, Bld: 80 mg/dL (ref 70–99)
Potassium: 3.9 meq/L (ref 3.5–5.1)
Sodium: 139 meq/L (ref 135–145)
Total Bilirubin: 0.7 mg/dL (ref 0.2–1.2)
Total Protein: 6.2 g/dL (ref 6.0–8.3)

## 2024-09-29 ENCOUNTER — Ambulatory Visit: Admitting: Obstetrics and Gynecology

## 2024-09-29 ENCOUNTER — Encounter: Payer: Self-pay | Admitting: Obstetrics and Gynecology

## 2024-10-07 LAB — IRON,TIBC AND FERRITIN PANEL
%SAT: 29 % (ref 16–45)
Ferritin: 21 ng/mL (ref 16–288)
Iron: 95 ug/dL (ref 45–160)
TIBC: 332 ug/dL (ref 250–450)

## 2024-10-16 ENCOUNTER — Other Ambulatory Visit (HOSPITAL_BASED_OUTPATIENT_CLINIC_OR_DEPARTMENT_OTHER): Payer: Self-pay

## 2024-10-24 ENCOUNTER — Ambulatory Visit: Admitting: Obstetrics and Gynecology

## 2024-10-24 ENCOUNTER — Encounter: Payer: Self-pay | Admitting: Obstetrics and Gynecology

## 2024-10-24 VITALS — BP 122/73 | HR 63

## 2024-10-24 DIAGNOSIS — R3915 Urgency of urination: Secondary | ICD-10-CM

## 2024-10-24 NOTE — Progress Notes (Signed)
 [facility] Return Visit  SUBJECTIVE  History of Present Illness: Alexis Wallace is a 68 y.o. female seen in follow-up for recurrent UTI and overactive bladder.   Had UTI symptoms about a month ago and culture was negative. When she had symptoms she has increased urgency and frequency. Had a few random episodes of incontinence. She denies dysuria. She has taken a few doses of azo and that helped symptoms. Drinks 1 cup coffee per day, 20oz water per day. Has occasional soda a few times a week.   She is using vaginal estrogen cream twice a week. She has finished nitrofurantion prophylaxis course.   Past Medical History: Patient  has a past medical history of Allergy, Anemia, iron deficiency, B12 deficiency, CAP (community acquired pneumonia) (11/18/2014), GERD (gastroesophageal reflux disease) (07/16/1978), Heartburn, Iron deficiency, Osteoarthritis, Osteopenia, Solitary pulmonary nodule (12/05/2014), Tibial plateau fracture, and Urinary tract infection.   Past Surgical History: She  has a past surgical history that includes Gastric bypass (2001); Cosmetic surgery; carpal tunnel right; Colonoscopy; Polypectomy; and Cataract extraction (Bilateral).   Medications: She has a current medication list which includes the following prescription(s): calcium  carbonate, cholecalciferol , cyanocobalamin , estradiol , estradiol , multivitamin, nitrofurantoin  (macrocrystal-monohydrate), omeprazole , progesterone , progesterone , prolia, rosuvastatin , temazepam , tirzepatide , and tretinoin.   Allergies: Patient has no known allergies.   Social History: Patient  reports that she quit smoking about 46 years ago. Her smoking use included cigarettes. She started smoking about 51 years ago. She has a 5 pack-year smoking history. She has never used smokeless tobacco. She reports that she does not drink alcohol and does not use drugs.      OBJECTIVE     Physical Exam: Vitals:   10/24/24 1302  BP:  122/73  Pulse: 63    Gen: No apparent distress, A&O x 3.  Detailed Urogynecologic Evaluation:  Deferred.     ASSESSMENT AND PLAN    Alexis Wallace is a 68 y.o. with:  1. Urinary urgency     - We discussed that bladder urgency symptoms may be due to caffeine/ soda intake. She should aim to drink more plain water and avoid irritative beverages. List provided of bladder irritants.  - Has been on UTI prophylaxis for about a year with nitrofurantoin  so will take a break and continue with estrogen cream only. Has previously tried methenamine  but this did not help prevent UTIs.  - She will come to office for any further UTI symptoms.   Follow up 6 months or sooner if needed  Rosaline LOISE Caper, MD  Time spent: I spent 20 minutes dedicated to the care of this patient on the date of this encounter to include pre-visit review of records, face-to-face time with the patient and post visit documentation and ordering medication/ testing.

## 2024-10-24 NOTE — Patient Instructions (Signed)

## 2024-11-14 ENCOUNTER — Ambulatory Visit: Admitting: Family Medicine

## 2024-11-14 ENCOUNTER — Encounter: Payer: Self-pay | Admitting: Family Medicine

## 2024-11-14 VITALS — BP 118/62 | HR 68 | Temp 98.1°F | Ht 65.0 in | Wt 144.2 lb

## 2024-11-14 DIAGNOSIS — E538 Deficiency of other specified B group vitamins: Secondary | ICD-10-CM

## 2024-11-14 DIAGNOSIS — Z7985 Long-term (current) use of injectable non-insulin antidiabetic drugs: Secondary | ICD-10-CM

## 2024-11-14 DIAGNOSIS — E119 Type 2 diabetes mellitus without complications: Secondary | ICD-10-CM | POA: Diagnosis not present

## 2024-11-14 DIAGNOSIS — M5432 Sciatica, left side: Secondary | ICD-10-CM

## 2024-11-14 MED ORDER — GABAPENTIN 100 MG PO CAPS: 100.0000 mg | ORAL_CAPSULE | Freq: Every evening | ORAL | 5 refills | Status: DC | PRN

## 2024-11-14 NOTE — Progress Notes (Signed)
 Phone [phone] In person visit   Subjective:   Alexis Wallace is a 68 y.o. year old very pleasant female patient who presents for/with See problem oriented charting Chief Complaint  Patient presents with   Nerve Pain    Left thigh nerve pain x1 year progressively been getting worse over the last month;  requesting diabetic eye exam from Dr. Waylan;    Past Medical History-  Patient Active Problem List   Diagnosis Date Noted   Diabetes mellitus, type II (HCC) 03/08/2023    Priority: High   Nonobstructive CAD 09/09/2022    Priority: High   Recurrent UTI 04/07/2016    Priority: High   Aortic atherosclerosis 09/09/2022    Priority: Medium    Vitamin D  deficiency 08/28/2022    Priority: Medium    Hyperlipidemia 05/20/2017    Priority: Medium    History of adenomatous polyp of colon 10/12/2016    Priority: Medium    Rosacea 08/30/2014    Priority: Medium    Insomnia 08/30/2014    Priority: Medium    Bariatric surgery status 09/01/2010    Priority: Medium    DETRUSOR, 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    Priority: Medium    ANEMIA, B12 DEFICIENCY 08/15/2007    Priority: Medium    Plantar fasciitis, right 12/13/2014    Priority: Low   Solitary pulmonary nodule 12/05/2014    Priority: Low   CAP (community acquired pneumonia) 11/18/2014    Priority: Low   Family history of hypertrophic cardiomyopathy 05/13/2011    Priority: Low   Actinic keratosis 10/22/2010    Priority: Low   ALLERGIC RHINITIS 03/01/2008    Priority: Low   GERD 06/02/2007    Priority: Low   OSTEOARTHRITIS 06/02/2007    Priority: Low   OSTEOPENIA 06/02/2007    Priority: Low    Medications- reviewed and updated Current Outpatient Medications  Medication Sig Dispense Refill   calcium  carbonate 200 MG capsule Take 3,000 mg by mouth daily.     Cholecalciferol  1.25 MG (50000 UT) TABS 50,000 units PO qwk for 13 weeks. 13 tablet 1   cyanocobalamin  (VITAMIN B12) 1000 MCG/ML injection INJECT (  TOTAL) INTO THE MUSCLE EVERY 30 DAYS 3 mL 3   estradiol  (ESTRACE ) 0.1 MG/GM vaginal cream Place 0.5 g vaginally 2 (two) times a week. Place 0.5g nightly for two weeks then twice a week after 30 g 11   estradiol  (ESTRACE ) 1 MG tablet Through GYN Dr. Celesta     gabapentin  (NEURONTIN ) 100 MG capsule Take 1 capsule (100 mg total) by mouth at bedtime as needed (for lateral thigh nerve irritation). 30 capsule 5   Multiple Vitamin (MULTIVITAMIN) tablet Take 1 tablet by mouth daily.     omeprazole  (PRILOSEC) 20 MG capsule TAKE ONE CAPSULE BY MOUTH DAILY 90 capsule 3   progesterone  (PROMETRIUM ) 100 MG capsule Take 2 capsules (200 mg total) by mouth daily. 180 capsule 3   progesterone  (PROMETRIUM ) 100 MG capsule Take 1 capsule (100 mg total) by mouth daily as directed. 90 capsule 3   PROLIA 60 MG/ML SOSY injection Inject 60 mg by subcutaneous route for 180 days.     rosuvastatin  (CRESTOR ) 5 MG tablet TAKE TWO TABLETS (10MG  TOTAL) BY MOUTH DAILY 90 tablet 3   temazepam  (RESTORIL ) 15 MG capsule TAKE ONE CAPSULE (15MG  TOTAL) BY MOUTH AT BEDTIME AS NEEDED FOR SLEEP 30 capsule 5   tirzepatide  (MOUNJARO ) 7.5 MG/0.5ML Pen Inject 7.5 mg into the skin once a week. 6 mL 3  tretinoin (RETIN-A) 0.025 % cream      No current facility-administered medications for this visit.     Objective:  BP 118/62 (BP Location: Left Arm, Patient Position: Sitting, Cuff Size: Normal)   Pulse 68   Temp 98.1 F (36.7 C) (Temporal)   Ht 5' 5 (1.651 m)   Wt 144 lb 3.2 oz (65.4 kg)   SpO2 99%   BMI 24.00 kg/m  Gen: NAD, resting comfortably Back - Normal skin, Spine with normal alignment and no deformity.  No tenderness to vertebral process palpation.  Paraspinous muscles are tender in upper portion of lumbar spine but without spasm.   Range of motion is full at neck and lumbar sacral regions. Positive Straight leg raise.  Neuro- 5/5 strength lower extremities, 2+ reflexes    Assessment and Plan   # Left thigh  pain/numbness/tingling S: Patient reports pain in her left thigh maybe a numbness intermittently over last year or more periodically. In the last month has been getting burning electrical pains as well and can wake her up from sleep. Moderate level pain. Gets sensation has to rub the area- has even bruised herself trying to get it to calm down. Advil helps some. Same side her knee bothers her and injections seem to be wearing out from June- shot with Dr. Beverley  -no weakness in leg other than once felt like gave a little- only once or twice discomofrot into shin A/P: Patient with left thigh numbness/tingling for some time intermittently now with more persistent issues as well as pain in this distribution.  Could be meralgia paresthetica but also wonder about L4 nerve root distribution irritation.  Discussed x-ray and MRI but she really wanted to make sure this was not something more significant and after discussion wanted to hold off and mention to orthopedics at upcoming visit.  She would like to try gabapentin  at bedtime since it can irritate her at night and I think that is reasonable-I sent this in for her -She does have diabetes but I do not think this is primarily diabetic neuropathy.  She does have B12 deficiency history but this has been well-controlled Lab Results  Component Value Date   VITAMINB12 533 03/28/2024    # Diabetes- recovered diagnosis from prior records- brings note from [facility] associated with diet controled diabetes listed - 08/04/2007 S: Medication: has been diet controlled but recently on Mounjaro  for weight loss at 7.5 mg Lab Results  Component Value Date   HGBA1C 5.0 09/08/2024   HGBA1C 5.6 01/07/2024   HGBA1C 5.3 08/27/2023   A/P: Well-controlled-continue current medication   # B12 deficiency S: Current treatment/medication (oral vs. IM): Home injections monthly A/P: Well-controlled as above-continue current medication    Recommended follow up: Return in about 3  months (around 02/12/2025) for followup or sooner if needed.Schedule b4 you leave. Future Appointments  Date Time Provider Department Center  11/22/2024 10:40 AM [facility] ANNUAL WELLNESS VISIT 1 [facility]  02/16/2025 10:20 AM Katrinka Garnette KIDD, MD [facility]    Lab/Order associations:   ICD-10-CM   1. Sciatica of left side  M54.32     2. Type 2 diabetes mellitus without complication, without long-term current use of insulin (HCC)  E11.9     3. Long-term current use of injectable noninsulin antidiabetic medication  Z79.85     4. B12 deficiency  E53.8       Meds ordered this encounter  Medications   gabapentin  (NEURONTIN ) 100 MG capsule  Sig: Take 1 capsule (100 mg total) by mouth at bedtime as needed (for lateral thigh nerve irritation).    Dispense:  30 capsule    Refill:  5    Return precautions advised.  Garnette Lukes, MD

## 2024-11-14 NOTE — Patient Instructions (Addendum)
 This could be a nerve root in the back tahts irritated- we could do x-ray or even MRI but after discussion we opted to treat conservatively with gabapentin  at nighttime just 100mg  as needed. Would also mention to orthopedics at upcoming visit  Recommended follow up: Return in about 3 months (around 02/12/2025) for followup or sooner if needed.Schedule b4 you leave.

## 2024-11-22 ENCOUNTER — Ambulatory Visit: Payer: Medicare PPO

## 2024-12-18 ENCOUNTER — Encounter: Payer: Self-pay | Admitting: *Deleted

## 2024-12-19 ENCOUNTER — Other Ambulatory Visit (HOSPITAL_BASED_OUTPATIENT_CLINIC_OR_DEPARTMENT_OTHER): Payer: Self-pay

## 2024-12-19 MED ORDER — COMIRNATY 30 MCG/0.3ML IM SUSY
0.3000 mL | PREFILLED_SYRINGE | Freq: Once | INTRAMUSCULAR | 0 refills | Status: AC
  Filled 2024-12-19: qty 0.3, 1d supply, fill #0

## 2025-01-03 ENCOUNTER — Ambulatory Visit (INDEPENDENT_AMBULATORY_CARE_PROVIDER_SITE_OTHER)

## 2025-01-03 VITALS — Ht 66.0 in | Wt 144.0 lb

## 2025-01-03 DIAGNOSIS — Z0000 Encounter for general adult medical examination without abnormal findings: Secondary | ICD-10-CM | POA: Diagnosis not present

## 2025-01-03 NOTE — Progress Notes (Signed)
 "  Chief Complaint  Patient presents with   Medicare Wellness     Subjective:   Alexis Wallace is a 69 y.o. female who presents for a Medicare Annual Wellness Visit.  Visit info / Clinical Intake: Medicare Wellness Visit Type:: Subsequent Annual Wellness Visit Persons participating in visit and providing information:: patient Medicare Wellness Visit Mode:: Telephone If telephone:: video declined Since this visit was completed virtually, some vitals may be partially provided or unavailable. Missing vitals are due to the limitations of the virtual format.: Unable to obtain vitals - no equipment If Telephone or Video please confirm:: I connected with patient using audio/video enable telemedicine. I verified patient identity with two identifiers, discussed telehealth limitations, and patient agreed to proceed. Patient Location:: home Provider Location:: office Interpreter Needed?: No Pre-visit prep was completed: yes AWV questionnaire completed by patient prior to visit?: yes Date:: 12/30/24 Living arrangements:: (Patient-Rptd) with family/others Patient's Overall Health Status Rating: (Patient-Rptd) excellent Typical amount of pain: (Patient-Rptd) some Does pain affect daily life?: (Patient-Rptd) no Are you currently prescribed opioids?: no  Dietary Habits and Nutritional Risks How many meals a day?: (Patient-Rptd) 3 Eats fruit and vegetables daily?: (Patient-Rptd) yes Most meals are obtained by: (Patient-Rptd) preparing own meals; eating out In the last 2 weeks, have you had any of the following?: none Diabetic:: (!) yes Any non-healing wounds?: no How often do you check your BS?: 0 Would you like to be referred to a Nutritionist or for Diabetic Management? : no  Functional Status Activities of Daily Living (to include ambulation/medication): (Patient-Rptd) Independent Ambulation: (Patient-Rptd) Independent Medication Administration: (Patient-Rptd) Independent Home  Management (perform basic housework or laundry): (Patient-Rptd) Independent Manage your own finances?: (Patient-Rptd) yes Primary transportation is: (Patient-Rptd) driving Concerns about vision?: no *vision screening is required for WTM* Concerns about hearing?: no  Fall Screening Falls in the past year?: (Patient-Rptd) 0 Number of falls in past year: 0 Was there an injury with Fall?: 0 Fall Risk Category Calculator: 0 Patient Fall Risk Level: Low Fall Risk  Fall Risk Patient at Risk for Falls Due to: No Fall Risks Fall risk Follow up: Falls evaluation completed  Home and Transportation Safety: All rugs have non-skid backing?: (Patient-Rptd) yes All stairs or steps have railings?: (Patient-Rptd) yes Grab bars in the bathtub or shower?: (!) (Patient-Rptd) no Have non-skid surface in bathtub or shower?: (Patient-Rptd) yes Good home lighting?: (Patient-Rptd) yes Regular seat belt use?: (Patient-Rptd) yes Hospital stays in the last year:: (Patient-Rptd) no  Cognitive Assessment Difficulty concentrating, remembering, or making decisions? : (Patient-Rptd) no Will 6CIT or Mini Cog be Completed: yes What year is it?: 0 points What month is it?: 0 points Give patient an address phrase to remember (5 components): [address] [place] About what time is it?: 0 points Count backwards from 20 to 1: 0 points Say the months of the year in reverse: 0 points Repeat the address phrase from earlier: 0 points 6 CIT Score: 0 points  Advance Directives (For Healthcare) Does Patient Have a Medical Advance Directive?: Yes Type of Advance Directive: Healthcare Power of Attorney Copy of Healthcare Power of Attorney in Chart?: No - copy requested  Reviewed/Updated  Reviewed/Updated: Reviewed All (Medical, Surgical, Family, Medications, Allergies, Care Teams, Patient Goals)    Allergies (verified) Patient has no known allergies.   Current Medications (verified) Outpatient Encounter  Medications as of 01/03/2025  Medication Sig   calcium  carbonate 200 MG capsule Take 3,000 mg by mouth daily.   Cholecalciferol  1.25 MG (  50000 UT) TABS 50,000 units PO qwk for 13 weeks.   cycloSPORINE (RESTASIS) 0.05 % ophthalmic emulsion    estradiol  (ESTRACE ) 0.1 MG/GM vaginal cream Place 0.5 g vaginally 2 (two) times a week. Place 0.5g nightly for two weeks then twice a week after   estradiol  (ESTRACE ) 1 MG tablet Through GYN Dr. Celesta   Multiple Vitamin (MULTIVITAMIN) tablet Take 1 tablet by mouth daily.   omeprazole  (PRILOSEC) 20 MG capsule TAKE ONE CAPSULE BY MOUTH DAILY   progesterone  (PROMETRIUM ) 100 MG capsule Take 2 capsules (200 mg total) by mouth daily.   PROLIA 60 MG/ML SOSY injection Inject 60 mg by subcutaneous route for 180 days.   rosuvastatin  (CRESTOR ) 5 MG tablet TAKE TWO TABLETS (10MG  TOTAL) BY MOUTH DAILY   temazepam  (RESTORIL ) 15 MG capsule TAKE ONE CAPSULE (15MG  TOTAL) BY MOUTH AT BEDTIME AS NEEDED FOR SLEEP   tirzepatide  (MOUNJARO ) 7.5 MG/0.5ML Pen Inject 7.5 mg into the skin once a week.   tretinoin (RETIN-A) 0.025 % cream    progesterone  (PROMETRIUM ) 100 MG capsule Take 1 capsule (100 mg total) by mouth daily as directed. (Patient not taking: Reported on 01/03/2025)   [DISCONTINUED] cyanocobalamin  (VITAMIN B12) 1000 MCG/ML injection INJECT ( TOTAL) INTO THE MUSCLE EVERY 30 DAYS   [DISCONTINUED] gabapentin  (NEURONTIN ) 100 MG capsule Take 1 capsule (100 mg total) by mouth at bedtime as needed (for lateral thigh nerve irritation).   No facility-administered encounter medications on file as of 01/03/2025.    History: Past Medical History:  Diagnosis Date   Allergy    Anemia, iron deficiency    B12 deficiency    CAP (community acquired pneumonia) 11/18/2014   GERD (gastroesophageal reflux disease) 07/16/1978   Heartburn    Iron deficiency    Osteoarthritis    Osteopenia    Solitary pulmonary nodule 12/05/2014   Dx 11/19/14  3 mm > f/u 11/27/2015 >  resolved, no further studies rec     Tibial plateau fracture    june 2020- murphy/wainer cared for her   Urinary tract infection    Past Surgical History:  Procedure Laterality Date   carpal tunnel right     CATARACT EXTRACTION Bilateral    finished the 2nd january 2025   COLONOSCOPY     COSMETIC SURGERY     tummy tuck   EYE SURGERY     GASTRIC BYPASS  2001   POLYPECTOMY     Family History  Problem Relation Age of Onset   Lung cancer Mother        stage IV, smoker   COPD Mother    Cancer Mother    Heart attack Father 13   Heart disease Father        started 22 cad- died at 19   Heart failure Father    Heart disease Sister        Hypertrophic cardiomyopathy   Cancer Sister    Hypertension Sister    Uterine cancer Sister    Hypertrophic cardiomyopathy Sister    Asthma Sister    Heart disease Sister    Colon cancer Neg Hx    Esophageal cancer Neg Hx    Rectal cancer Neg Hx    Stomach cancer Neg Hx    Colon polyps Neg Hx    Bladder Cancer Neg Hx    Renal cancer Neg Hx    Social History   Occupational History   Not on file  Tobacco Use   Smoking status: Former  Current packs/day: 0.00    Average packs/day: 1 pack/day for 5.0 years (5.0 ttl pk-yrs)    Types: Cigarettes    Start date: 02/10/1973    Quit date: 02/10/1978    Years since quitting: 46.9   Smokeless tobacco: Never  Vaping Use   Vaping status: Never Used  Substance and Sexual Activity   Alcohol use: Not Currently   Drug use: Never   Sexual activity: Not Currently    Birth control/protection: Post-menopausal, None   Tobacco Counseling Counseling given: Not Answered  SDOH Screenings   Food Insecurity: No Food Insecurity (01/03/2025)  Housing: Low Risk (01/03/2025)  Transportation Needs: No Transportation Needs (01/03/2025)  Utilities: Not At Risk (01/03/2025)  Alcohol Screen: Low Risk (11/10/2024)  Depression (PHQ2-9): Low Risk (01/03/2025)  Financial Resource Strain: Low Risk (11/10/2024)   Physical Activity: Insufficiently Active (01/03/2025)  Social Connections: Moderately Integrated (01/03/2025)  Stress: No Stress Concern Present (01/03/2025)  Tobacco Use: Medium Risk (01/03/2025)  Health Literacy: Adequate Health Literacy (01/03/2025)   See flowsheets for full screening details  Depression Screen PHQ 2 & 9 Depression Scale- Over the past 2 weeks, how often have you been bothered by any of the following problems? Little interest or pleasure in doing things: 0 Feeling down, depressed, or hopeless (PHQ Adolescent also includes...irritable): 0 PHQ-2 Total Score: 0 Trouble falling or staying asleep, or sleeping too much: 0 Feeling tired or having little energy: 0 Poor appetite or overeating (PHQ Adolescent also includes...weight loss): 0 Feeling bad about yourself - or that you are a failure or have let yourself or your family down: 0 Trouble concentrating on things, such as reading the newspaper or watching television (PHQ Adolescent also includes...like school work): 0 Moving or speaking so slowly that other people could have noticed. Or the opposite - being so fidgety or restless that you have been moving around a lot more than usual: 0 Thoughts that you would be better off dead, or of hurting yourself in some way: 0 PHQ-9 Total Score: 0 If you checked off any problems, how difficult have these problems made it for you to do your work, take care of things at home, or get along with other people?: Not difficult at all     Goals Addressed               This Visit's Progress     continue to work out [employer] THE gym (pt-stated)               Objective:    Today's Vitals   01/03/25 1104  Weight: 144 lb (65.3 kg)  Height: 5' 6 (1.676 m)   Body mass index is 23.24 kg/m.  Hearing/Vision screen No results found. Immunizations and Health Maintenance Health Maintenance  Topic Date Due   FOOT EXAM  08/26/2024   HEMOGLOBIN A1C  03/09/2025   Diabetic kidney  evaluation - Urine ACR  03/28/2025   COVID-19 Vaccine (10 - Pfizer risk 2025-26 season) 06/18/2025   OPHTHALMOLOGY EXAM  06/22/2025   Diabetic kidney evaluation - eGFR measurement  09/15/2025   DTaP/Tdap/Td (3 - Td or Tdap) 12/09/2025   Medicare Annual Wellness (AWV)  01/03/2026   Mammogram  02/21/2026   Colonoscopy  08/23/2027   Pneumococcal Vaccine: 50+ Years  Completed   Influenza Vaccine  Completed   Bone Density Scan  Completed   Hepatitis C Screening  Completed   Zoster Vaccines- Shingrix  Completed   Meningococcal B Vaccine  Aged Out  Assessment/Plan:  This is a routine wellness examination for Graysen.  Patient Care Team: Katrinka Garnette KIDD, MD as PCP - General (Family Medicine) Waylan Cain, MD as Consulting Physician (Ophthalmology) Marilynne Rosaline SAILOR, MD as Consulting Physician (Obstetrics and Gynecology) [facility], [facility]  I have personally reviewed and noted the following in the patients chart:   Medical and social history Use of alcohol, tobacco or illicit drugs  Current medications and supplements including opioid prescriptions. Functional ability and status Nutritional status Physical activity Advanced directives List of other physicians Hospitalizations, surgeries, and ER visits in previous 12 months Vitals Screenings to include cognitive, depression, and falls Referrals and appointments  No orders of the defined types were placed in this encounter.  In addition, I have reviewed and discussed with patient certain preventive protocols, quality metrics, and best practice recommendations. A written personalized care plan for preventive services as well as general preventive health recommendations were provided to patient.   Ellouise VEAR Haws, LPN   [id number]/71/7973   Return in about 1 year (around 01/07/2026).  After Visit Summary: (MyChart) Due to this being a telephonic visit, the after visit summary with patients personalized plan  was offered to patient via MyChart   Nurse Notes: HM Addressed: Diabetic Foot Exam recommended "

## 2025-01-03 NOTE — Patient Instructions (Signed)
 Ms. Strahan,  Thank you for taking the time for your Medicare Wellness Visit. I appreciate your continued commitment to your health goals. Please review the care plan we discussed, and feel free to reach out if I can assist you further.  Please note that Annual Wellness Visits do not include a physical exam. Some assessments may be limited, especially if the visit was conducted virtually. If needed, we may recommend an in-person follow-up with your provider.  Ongoing Care Seeing your primary care provider every 3 to 6 months helps us  monitor your health and provide consistent, personalized care.   Referrals If a referral was made during today's visit and you haven't received any updates within two weeks, please contact the referred provider directly to check on the status.  Recommended Screenings:  Health Maintenance  Topic Date Due   Complete foot exam   08/26/2024   Hemoglobin A1C  03/09/2025   Kidney health urinalysis for diabetes  03/28/2025   COVID-19 Vaccine (10 - Pfizer risk 2025-26 season) 06/18/2025   Eye exam for diabetics  06/22/2025   Yearly kidney function blood test for diabetes  09/15/2025   DTaP/Tdap/Td vaccine (3 - Td or Tdap) 12/09/2025   Medicare Annual Wellness Visit  01/03/2026   Breast Cancer Screening  02/21/2026   Colon Cancer Screening  08/23/2027   Pneumococcal Vaccine for age over 15  Completed   Flu Shot  Completed   Osteoporosis screening with Bone Density Scan  Completed   Hepatitis C Screening  Completed   Zoster (Shingles) Vaccine  Completed   Meningitis B Vaccine  Aged Out       12/30/2024    8:53 AM  Advanced Directives  Does Patient Have a Medical Advance Directive? Yes  Type of Advance Directive Healthcare Power of Attorney  Copy of Healthcare Power of Attorney in Chart? No - copy requested    Vision: Annual vision screenings are recommended for early detection of glaucoma, cataracts, and diabetic retinopathy. These exams can also reveal  signs of chronic conditions such as diabetes and high blood pressure.  Dental: Annual dental screenings help detect early signs of oral cancer, gum disease, and other conditions linked to overall health, including heart disease and diabetes.  Please see the attached documents for additional preventive care recommendations.

## 2025-01-06 ENCOUNTER — Other Ambulatory Visit (HOSPITAL_BASED_OUTPATIENT_CLINIC_OR_DEPARTMENT_OTHER): Payer: Self-pay

## 2025-01-09 ENCOUNTER — Other Ambulatory Visit (HOSPITAL_BASED_OUTPATIENT_CLINIC_OR_DEPARTMENT_OTHER): Payer: Self-pay

## 2025-01-12 ENCOUNTER — Encounter: Payer: Self-pay | Admitting: Family Medicine

## 2025-02-16 ENCOUNTER — Ambulatory Visit: Admitting: Family Medicine

## 2026-01-07 ENCOUNTER — Ambulatory Visit
